# Patient Record
Sex: Male | Born: 1955 | Race: Black or African American | Hispanic: No | Smoking: Former smoker
Health system: Southern US, Community
[De-identification: ages and names within clinical notes are randomized; demographics above are authoritative.]

## PROBLEM LIST (undated history)

## (undated) DIAGNOSIS — K219 Gastro-esophageal reflux disease without esophagitis: Secondary | ICD-10-CM

## (undated) DIAGNOSIS — E119 Type 2 diabetes mellitus without complications: Secondary | ICD-10-CM

## (undated) DIAGNOSIS — C169 Malignant neoplasm of stomach, unspecified: Secondary | ICD-10-CM

## (undated) DIAGNOSIS — C801 Malignant (primary) neoplasm, unspecified: Secondary | ICD-10-CM

## (undated) HISTORY — PX: NO PAST SURGERIES: SHX2092

---

## 2004-02-19 ENCOUNTER — Emergency Department (HOSPITAL_COMMUNITY): Admission: EM | Admit: 2004-02-19 | Discharge: 2004-02-19 | Payer: Self-pay | Admitting: Emergency Medicine

## 2006-11-28 ENCOUNTER — Encounter (HOSPITAL_COMMUNITY): Admission: RE | Admit: 2006-11-28 | Discharge: 2006-12-28 | Payer: Self-pay | Admitting: Preventative Medicine

## 2014-12-06 HISTORY — PX: ESOPHAGOGASTRODUODENOSCOPY: SHX1529

## 2014-12-09 ENCOUNTER — Encounter (INDEPENDENT_AMBULATORY_CARE_PROVIDER_SITE_OTHER): Payer: Self-pay | Admitting: *Deleted

## 2015-01-05 ENCOUNTER — Encounter: Payer: Self-pay | Admitting: General Surgery

## 2015-01-05 NOTE — Progress Notes (Signed)
Troy Barrera 01/05/2015 11:25 AM Location: [facility] Patient #: [id number] DOB: [birth year] Single / Language: Troy Barrera / Race: Black or African American Male History of Present Illness Troy Hollingshead MD; 01/05/2015 12:57 PM) Patient words: GI mass.  The patient is a 59 year old male    Note:He is referred by Dr. 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 He presented complaining of dyspepsia and weight loss. Upper endoscopy December 10, 2014 demonstrated an ulcerative mass within the body of stomach on the greater curvature. Biopsy was consistent with adenocarcinoma. CT scan of chest, abdomen, and pelvis demonstrates some small lymph nodes around the tumor and a 5 mm pulmonary nodule. No other obvious metastatic disease. Has some nausea and vomiting with eating at times. Currently is taking Zantac. He is here with one of his family members and a friend. His evaluation has been done at [facility]. He is here seeking a second opinion on the surgery.  Past Surgical History Troy Barrera, [place]; 01/05/2015 11:25 AM) No pertinent past surgical history  Diagnostic Studies History Troy Barrera, CMA; 01/05/2015 11:25 AM) Colonoscopy 5-10 years ago  Allergies Troy Barrera, CMA; 01/05/2015 11:26 AM) No Known Drug Allergies 01/05/2015  Medication History (Troy Barrera, CMA; 01/05/2015 11:27 AM) GlipiZIDE ER (5MG  Tablet ER 24HR, Oral) Active. Zantac 75 (75MG  Tablet, Oral) Active. Medications Reconciled  Social History Troy Barrera, CMA; 01/05/2015 11:25 AM) Alcohol use Moderate alcohol use. Caffeine use Tea. Illicit drug use Remotely quit drug use. Tobacco use Current every day smoker.  Family History Troy Barrera, CMA; 01/05/2015 11:25 AM) Diabetes Mellitus Brother, Daughter, Mother, Sister. Heart Disease Father. Hypertension Mother.     Review of Systems ([place];  01/05/2015 11:25 AM) General Present- Weight Loss. Not Present- Appetite Loss, Chills, Fatigue, Fever, Night Sweats and Weight Gain. Skin Not Present- Change in Wart/Mole, Dryness, Hives, Jaundice, New Lesions, Non-Healing Wounds, Rash and Ulcer. HEENT Not Present- Earache, Hearing Loss, Hoarseness, Nose Bleed, Oral Ulcers, Ringing in the Ears, Seasonal Allergies, Sinus Pain, Sore Throat, Visual Disturbances, Wears glasses/contact lenses and Yellow Eyes. Respiratory Not Present- Bloody sputum, Chronic Cough, Difficulty Breathing, Snoring and Wheezing. Breast Not Present- Breast Mass, Breast Pain, Nipple Discharge and Skin Changes. Cardiovascular Not Present- Chest Pain, Difficulty Breathing Lying Down, Leg Cramps, Palpitations, Rapid Heart Rate, Shortness of Breath and Swelling of Extremities. Gastrointestinal Present- Abdominal Pain. Not Present- Bloating, Bloody Stool, Change in Bowel Habits, Chronic diarrhea, Constipation, Difficulty Swallowing, Excessive gas, Gets full quickly at meals, Hemorrhoids, Indigestion, Nausea, Rectal Pain and Vomiting. Male Genitourinary Not Present- Blood in Urine, Change in Urinary Stream, Frequency, Impotence, Nocturia, Painful Urination, Urgency and Urine Leakage.  Vitals (Troy Barrera CMA; 01/05/2015 11:26 AM) 01/05/2015 11:26 AM Weight: 154 lb Height: 68in Body Surface Area: 1.83 m Body Mass Index: 23.42 kg/m Temp.: 51F(Temporal)  Pulse: 76 (Regular)  BP: 130/80 (Sitting, Left Arm, Standard)     Physical Exam Troy Hollingshead MD; 01/05/2015 12:58 PM)  The physical exam findings are as follows: Note:General: WDWN in NAD. Pleasant and cooperative.  HEENT: Troy Barrera/AT, no facial masses  NECK: Supple, no obvious mass or thyroid enlargement.  CV: RRR, no murmur, no JVD.  CHEST: Breath sounds equal and clear. Respirations nonlabored.  ABDOMEN: Soft, nontender, nondistended, no masses, no organomegaly, active bowel sounds, no scars, no  hernias.  MUSCULOSKELETAL: FROM, good muscle tone, no edema, no venous stasis changes  LYMPHATIC: No palpable cervical, supraclavicular, axillary  adenopathy.  SKIN: No jaundice or suspicious rashes.  NEUROLOGIC: Alert and oriented, answers questions appropriately, normal gait and station.  PSYCHIATRIC: Normal mood, affect , and behavior.    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 We discussed the fact that surgery is the mainstay of treatment for gastric cancer. Depending on the stage, he may require further treatment.  Plan: I recommended partial gastrectomy/subtotal gastrectomy. We'll need to start nutritional supplements for diabetics 3 times a day and we discussed this. I recommended he stop smoking to decrease perioperative complications. We went over the procedure and risks of gastric resection. Risks include but are not limited to bleeding, infection, anesthesia, wound healing problems, injury to intra-abdominal organs, anastomotic leak, failure of stomach to empty, weight loss. I discussed with him the fact that some of these complications were increased with persistent smoking and strongly advised him to stop smoking. He seems to understand all this and would like to proceed.  Jackolyn Confer, MD

## 2015-01-17 ENCOUNTER — Ambulatory Visit (INDEPENDENT_AMBULATORY_CARE_PROVIDER_SITE_OTHER): Payer: Self-pay | Admitting: Internal Medicine

## 2015-01-24 NOTE — Patient Instructions (Addendum)
Troy Barrera  01/24/2015   Your procedure is scheduled on: Friday 01/28/2015  Report to [facility] Main  Entrance take [place]  elevators to 3rd floor to  [place] at  0730 AM.  Call this number if you have problems the morning of surgery [phone]   Remember: ONLY 1 PERSON MAY GO WITH YOU TO SHORT STAY TO GET  READY MORNING OF [place].  Do not eat food or drink liquids :After Midnight.     Take these medicines the morning of surgery with A SIP OF WATER: Zantac               DO NOT TAKE ANY DIABETIC MEDICATIONS MORNING OF SURGERY!                  You may not have any metal on your body including hair pins and              piercings  Do not wear jewelry, make-up, lotions, powders or perfumes, deodorant             Do not wear nail polish.  Do not shave  48 hours prior to surgery.              Men may shave face and neck.   Do not bring valuables to the hospital. [place].  Contacts, dentures or bridgework may not be worn into surgery.  Leave suitcase in the car. After surgery it may be brought to your room.     Patients discharged the day of surgery will not be allowed to drive home.  Name and phone number of your driver:  Special Instructions: N/A              Please read over the following fact sheets you were given: _____________________________________________________________________             [facility] - Preparing for Surgery Before surgery, you can play an important role.  Because skin is not sterile, your skin needs to be as free of germs as possible.  You can reduce the number of germs on your skin by washing with CHG (chlorahexidine gluconate) soap before surgery.  CHG is an antiseptic cleaner which kills germs and bonds with the skin to continue killing germs even after washing. Please DO NOT use if you have an allergy to CHG or antibacterial soaps.  If your skin becomes  reddened/irritated stop using the CHG and inform your nurse when you arrive at Short Stay. Do not shave (including legs and underarms) for at least 48 hours prior to the first CHG shower.  You may shave your face/neck. Please follow these instructions carefully:  1.  Shower with CHG Soap the night before surgery and the  morning of Surgery.  2.  If you choose to wash your hair, wash your hair first as usual with your  normal  shampoo.  3.  After you shampoo, rinse your hair and body thoroughly to remove the  shampoo.                           4.  Use CHG as you would any other liquid soap.  You can apply chg directly  to the skin and wash  Gently with a scrungie or clean washcloth.  5.  Apply the CHG Soap to your body ONLY FROM THE NECK DOWN.   Do not use on face/ open                           Wound or open sores. Avoid contact with eyes, ears mouth and genitals (private parts).                       Wash face,  Genitals (private parts) with your normal soap.             6.  Wash thoroughly, paying special attention to the area where your surgery  will be performed.  7.  Thoroughly rinse your body with warm water from the neck down.  8.  DO NOT shower/wash with your normal soap after using and rinsing off  the CHG Soap.                9.  Pat yourself dry with a clean towel.            10.  Wear clean pajamas.            11.  Place clean sheets on your bed the night of your first shower and do not  sleep with pets. Day of Surgery : Do not apply any lotions/deodorants the morning of surgery.  Please wear clean clothes to the hospital/surgery center.  FAILURE TO FOLLOW THESE INSTRUCTIONS MAY RESULT IN THE CANCELLATION OF YOUR SURGERY PATIENT SIGNATURE_________________________________  NURSE SIGNATURE__________________________________  ________________________________________________________________________   Troy Barrera  An incentive spirometer is a tool that  can help keep your lungs clear and active. This tool measures how well you are filling your lungs with each breath. Taking long deep breaths may help reverse or decrease the chance of developing breathing (pulmonary) problems (especially infection) following:  A long period of time when you are unable to move or be active. BEFORE THE PROCEDURE   If the spirometer includes an indicator to show your best effort, your nurse or respiratory therapist will set it to a desired goal.  If possible, sit up straight or lean slightly forward. Try not to slouch.  Hold the incentive spirometer in an upright position. INSTRUCTIONS FOR USE   Sit on the edge of your bed if possible, or sit up as far as you can in bed or on a chair.  Hold the incentive spirometer in an upright position.  Breathe out normally.  Place the mouthpiece in your mouth and seal your lips tightly around it.  Breathe in slowly and as deeply as possible, raising the piston or the ball toward the top of the column.  Hold your breath for 3-5 seconds or for as long as possible. Allow the piston or ball to fall to the bottom of the column.  Remove the mouthpiece from your mouth and breathe out normally.  Rest for a few seconds and repeat Steps 1 through 7 at least 10 times every 1-2 hours when you are awake. Take your time and take a few normal breaths between deep breaths.  The spirometer may include an indicator to show your best effort. Use the indicator as a goal to work toward during each repetition.  After each set of 10 deep breaths, practice coughing to be sure your lungs are clear. If you have an incision (the cut made at the time of surgery),  support your incision when coughing by placing a pillow or rolled up towels firmly against it. Once you are able to get out of bed, walk around indoors and cough well. You may stop using the incentive spirometer when instructed by your caregiver.  RISKS AND COMPLICATIONS  Take your  time so you do not get dizzy or light-headed.  If you are in pain, you may need to take or ask for pain medication before doing incentive spirometry. It is harder to take a deep breath if you are having pain. AFTER USE  Rest and breathe slowly and easily.  It can be helpful to keep track of a log of your progress. Your caregiver can provide you with a simple table to help with this. If you are using the spirometer at home, follow these instructions: [place] IF:   You are having difficultly using the spirometer.  You have trouble using the spirometer as often as instructed.  Your pain medication is not giving enough relief while using the spirometer.  You develop fever of 100.5 F (38.1 C) or higher. SEEK IMMEDIATE MEDICAL CARE IF:   You cough up bloody sputum that had not been present before.  You develop fever of 102 F (38.9 C) or greater.  You develop worsening pain at or near the incision site. MAKE SURE YOU:   Understand these instructions.  Will watch your condition.  Will get help right away if you are not doing well or get worse. Document Released: 09/03/2006 Document Revised: 07/16/2011 Document Reviewed: 11/04/2006 ExitCare Patient Information 2014 ExitCare, [place].   ________________________________________________________________________  WHAT IS A BLOOD TRANSFUSION? Blood Transfusion Information  A transfusion is the replacement of blood or some of its parts. Blood is made up of multiple cells which provide different functions.  Red blood cells carry oxygen and are used for blood loss replacement.  White blood cells fight against infection.  Platelets control bleeding.  Plasma helps clot blood.  Other blood products are available for specialized needs, such as hemophilia or other clotting disorders. BEFORE THE TRANSFUSION  Who gives blood for transfusions?   Healthy volunteers who are fully evaluated to make sure their blood is safe. This  is blood bank blood. Transfusion therapy is the safest it has ever been in the practice of medicine. Before blood is taken from a donor, a complete history is taken to make sure that person has no history of diseases nor engages in risky social behavior (examples are intravenous drug use or sexual activity with multiple partners). The donor's travel history is screened to minimize risk of transmitting infections, such as malaria. The donated blood is tested for signs of infectious diseases, such as HIV and hepatitis. The blood is then tested to be sure it is compatible with you in order to minimize the chance of a transfusion reaction. If you or a relative donates blood, this is often done in anticipation of surgery and is not appropriate for emergency situations. It takes many days to process the donated blood. RISKS AND COMPLICATIONS Although transfusion therapy is very safe and saves many lives, the main dangers of transfusion include:   Getting an infectious disease.  Developing a transfusion reaction. This is an allergic reaction to something in the blood you were given. Every precaution is taken to prevent this. The decision to have a blood transfusion has been considered carefully by your caregiver before blood is given. Blood is not given unless the benefits outweigh the risks. AFTER THE TRANSFUSION  Right after receiving a blood transfusion, you will usually feel much better and more energetic. This is especially true if your red blood cells have gotten low (anemic). The transfusion raises the level of the red blood cells which carry oxygen, and this usually causes an energy increase.  The nurse administering the transfusion will monitor you carefully for complications. HOME CARE INSTRUCTIONS  No special instructions are needed after a transfusion. You may find your energy is better. Speak with your caregiver about any limitations on activity for underlying diseases you may have. SEEK  MEDICAL CARE IF:   Your condition is not improving after your transfusion.  You develop redness or irritation at the intravenous (IV) site. SEEK IMMEDIATE MEDICAL CARE IF:  Any of the following symptoms occur over the next 12 hours:  Shaking chills.  You have a temperature by mouth above 102 F (38.9 C), not controlled by medicine.  Chest, back, or muscle pain.  People around you feel you are not acting correctly or are confused.  Shortness of breath or difficulty breathing.  Dizziness and fainting.  You get a rash or develop hives.  You have a decrease in urine output.  Your urine turns a dark color or changes to pink, red, or brown. Any of the following symptoms occur over the next 10 days:  You have a temperature by mouth above 102 F (38.9 C), not controlled by medicine.  Shortness of breath.  Weakness after normal activity.  The white part of the eye turns yellow (jaundice).  You have a decrease in the amount of urine or are urinating less often.  Your urine turns a dark color or changes to pink, red, or brown. Document Released: 04/20/2000 Document Revised: 07/16/2011 Document Reviewed: 12/08/2007 [facility] Patient Information 2014 [place], [place].  _______________________________________________________________________

## 2015-01-27 ENCOUNTER — Encounter (HOSPITAL_COMMUNITY)
Admission: RE | Admit: 2015-01-27 | Discharge: 2015-01-27 | Disposition: A | Discharge status: Home / Self Care | Payer: Managed Care, Other (non HMO) | Source: Ambulatory Visit | Attending: General Surgery | Admitting: General Surgery

## 2015-01-27 ENCOUNTER — Encounter (HOSPITAL_COMMUNITY): Payer: Self-pay

## 2015-01-27 HISTORY — DX: Gastro-esophageal reflux disease without esophagitis: K21.9

## 2015-01-27 HISTORY — DX: Type 2 diabetes mellitus without complications: E11.9

## 2015-01-27 HISTORY — DX: Malignant (primary) neoplasm, unspecified: C80.1

## 2015-01-27 LAB — CBC WITH DIFFERENTIAL/PLATELET
BASOS PCT: 1 %
Basophils Absolute: 0.1 10*3/uL (ref 0.0–0.1)
EOS ABS: 0.3 10*3/uL (ref 0.0–0.7)
EOS PCT: 5 %
HCT: 40.3 % (ref 39.0–52.0)
HEMOGLOBIN: 13.9 g/dL (ref 13.0–17.0)
Lymphocytes Relative: 45 %
Lymphs Abs: 2.7 10*3/uL (ref 0.7–4.0)
MCH: 27.7 pg (ref 26.0–34.0)
MCHC: 34.5 g/dL (ref 30.0–36.0)
MCV: 80.3 fL (ref 78.0–100.0)
Monocytes Absolute: 0.8 10*3/uL (ref 0.1–1.0)
Monocytes Relative: 13 %
NEUTROS PCT: 36 %
Neutro Abs: 2.2 10*3/uL (ref 1.7–7.7)
PLATELETS: 238 10*3/uL (ref 150–400)
RBC: 5.02 MIL/uL (ref 4.22–5.81)
RDW: 14.8 % (ref 11.5–15.5)
WBC: 6.1 10*3/uL (ref 4.0–10.5)

## 2015-01-27 LAB — COMPREHENSIVE METABOLIC PANEL
ALBUMIN: 4.1 g/dL (ref 3.5–5.0)
ALK PHOS: 68 U/L (ref 38–126)
ALT: 22 U/L (ref 17–63)
ANION GAP: 6 (ref 5–15)
AST: 22 U/L (ref 15–41)
BUN: 17 mg/dL (ref 6–20)
CALCIUM: 9 mg/dL (ref 8.9–10.3)
CHLORIDE: 106 mmol/L (ref 101–111)
CO2: 27 mmol/L (ref 22–32)
Creatinine, Ser: 1.19 mg/dL (ref 0.61–1.24)
GFR calc non Af Amer: >60 mL/min (ref >60)
GLUCOSE: 80 mg/dL (ref 65–99)
Potassium: 4.1 mmol/L (ref 3.5–5.1)
SODIUM: 139 mmol/L (ref 135–145)
Total Bilirubin: 0.2 mg/dL — ABNORMAL LOW (ref 0.3–1.2)
Total Protein: 7 g/dL (ref 6.5–8.1)

## 2015-01-27 LAB — ABO/RH: ABO/RH(D): B POS

## 2015-01-27 LAB — PROTIME-INR
INR: 1.08 (ref 0.00–1.49)
Prothrombin Time: 14.2 seconds (ref 11.6–15.2)

## 2015-01-28 ENCOUNTER — Encounter (HOSPITAL_COMMUNITY)
Admission: RE | Disposition: A | Discharge status: Home / Self Care | Payer: Self-pay | Source: Ambulatory Visit | Attending: General Surgery

## 2015-01-28 ENCOUNTER — Encounter (HOSPITAL_COMMUNITY): Payer: Self-pay

## 2015-01-28 ENCOUNTER — Inpatient Hospital Stay (HOSPITAL_COMMUNITY): Payer: Managed Care, Other (non HMO) | Admitting: Certified Registered Nurse Anesthetist

## 2015-01-28 ENCOUNTER — Inpatient Hospital Stay (HOSPITAL_COMMUNITY)
Admission: RE | Admit: 2015-01-28 | Discharge: 2015-02-03 | DRG: 328 | Disposition: A | Discharge status: Home / Self Care | Payer: Managed Care, Other (non HMO) | Source: Ambulatory Visit | Attending: General Surgery | Admitting: General Surgery

## 2015-01-28 DIAGNOSIS — K469 Unspecified abdominal hernia without obstruction or gangrene: Secondary | ICD-10-CM | POA: Diagnosis present

## 2015-01-28 DIAGNOSIS — C162 Malignant neoplasm of body of stomach: Principal | ICD-10-CM | POA: Diagnosis present

## 2015-01-28 DIAGNOSIS — C169 Malignant neoplasm of stomach, unspecified: Secondary | ICD-10-CM

## 2015-01-28 DIAGNOSIS — Z79899 Other long term (current) drug therapy: Secondary | ICD-10-CM

## 2015-01-28 DIAGNOSIS — E119 Type 2 diabetes mellitus without complications: Secondary | ICD-10-CM | POA: Diagnosis present

## 2015-01-28 DIAGNOSIS — K259 Gastric ulcer, unspecified as acute or chronic, without hemorrhage or perforation: Secondary | ICD-10-CM | POA: Diagnosis present

## 2015-01-28 HISTORY — PX: PARTIAL GASTRECTOMY: SHX6003

## 2015-01-28 HISTORY — DX: Malignant neoplasm of stomach, unspecified: C16.9

## 2015-01-28 LAB — TYPE AND SCREEN
ABO/RH(D): B POS
Antibody Screen: NEGATIVE

## 2015-01-28 LAB — GLUCOSE, CAPILLARY
GLUCOSE-CAPILLARY: 178 mg/dL — AB (ref 65–99)
Glucose-Capillary: 125 mg/dL — ABNORMAL HIGH (ref 65–99)
Glucose-Capillary: 166 mg/dL — ABNORMAL HIGH (ref 65–99)
Glucose-Capillary: 119 mg/dL — ABNORMAL HIGH (ref 65–99)

## 2015-01-28 SURGERY — GASTRECTOMY, PARTIAL
Anesthesia: General

## 2015-01-28 MED ORDER — ONDANSETRON HCL 4 MG/2ML IJ SOLN: 4.0000 mg | INTRAMUSCULAR | Status: DC | PRN

## 2015-01-28 MED ORDER — DIPHENHYDRAMINE HCL 12.5 MG/5ML PO ELIX
12.5000 mg | ORAL_SOLUTION | Freq: Four times a day (QID) | ORAL | Status: DC | PRN
  Filled 2015-01-28: qty 5

## 2015-01-28 MED ORDER — EPHEDRINE SULFATE 50 MG/ML IJ SOLN
INTRAMUSCULAR | Status: DC | PRN
  Administered 2015-01-28: 5 mg via INTRAVENOUS
  Administered 2015-01-28 (×2): 10 mg via INTRAVENOUS

## 2015-01-28 MED ORDER — MIDAZOLAM HCL 5 MG/5ML IJ SOLN
INTRAMUSCULAR | Status: DC | PRN
  Administered 2015-01-28: 2 mg via INTRAVENOUS

## 2015-01-28 MED ORDER — CEFAZOLIN SODIUM-DEXTROSE 2-3 GM-% IV SOLR
2.0000 g | Freq: Three times a day (TID) | INTRAVENOUS | Status: AC
  Administered 2015-01-28 (×2): 2 g via INTRAVENOUS
  Filled 2015-01-28 (×2): qty 50

## 2015-01-28 MED ORDER — CEFAZOLIN SODIUM-DEXTROSE 2-3 GM-% IV SOLR
2.0000 g | INTRAVENOUS | Status: AC
  Administered 2015-01-28: 2 g via INTRAVENOUS

## 2015-01-28 MED ORDER — DIPHENHYDRAMINE HCL 50 MG/ML IJ SOLN
12.5000 mg | Freq: Four times a day (QID) | INTRAMUSCULAR | Status: DC | PRN
  Administered 2015-01-30: 12.5 mg via INTRAVENOUS
  Filled 2015-01-28: qty 1

## 2015-01-28 MED ORDER — INFLUENZA VAC SPLIT QUAD 0.5 ML IM SUSY
0.5000 mL | PREFILLED_SYRINGE | INTRAMUSCULAR | Status: DC
  Filled 2015-01-28 (×2): qty 0.5

## 2015-01-28 MED ORDER — PANTOPRAZOLE SODIUM 40 MG IV SOLR
40.0000 mg | Freq: Two times a day (BID) | INTRAVENOUS | Status: DC
  Administered 2015-01-28 – 2015-02-02 (×11): 40 mg via INTRAVENOUS
  Filled 2015-01-28 (×14): qty 40

## 2015-01-28 MED ORDER — MORPHINE SULFATE 1 MG/ML IV SOLN
INTRAVENOUS | Status: DC
  Administered 2015-01-28: 13:00:00 via INTRAVENOUS

## 2015-01-28 MED ORDER — SODIUM CHLORIDE 0.9 % IJ SOLN: 9.0000 mL | INTRAMUSCULAR | Status: DC | PRN

## 2015-01-28 MED ORDER — LIDOCAINE HCL (CARDIAC) 20 MG/ML IV SOLN
INTRAVENOUS | Status: DC | PRN
  Administered 2015-01-28: 50 mg via INTRAVENOUS

## 2015-01-28 MED ORDER — SODIUM CHLORIDE 0.9 % IJ SOLN
INTRAMUSCULAR | Status: AC
  Filled 2015-01-28: qty 10

## 2015-01-28 MED ORDER — CEFAZOLIN SODIUM-DEXTROSE 2-3 GM-% IV SOLR
INTRAVENOUS | Status: AC
  Filled 2015-01-28: qty 50

## 2015-01-28 MED ORDER — GLYCOPYRROLATE 0.2 MG/ML IJ SOLN
INTRAMUSCULAR | Status: AC
  Filled 2015-01-28: qty 3

## 2015-01-28 MED ORDER — INSULIN ASPART 100 UNIT/ML ~~LOC~~ SOLN
0.0000 [IU] | SUBCUTANEOUS | Status: DC
  Administered 2015-01-28: 3 [IU] via SUBCUTANEOUS
  Administered 2015-01-29: 2 [IU] via SUBCUTANEOUS
  Administered 2015-01-29 – 2015-01-30 (×6): 3 [IU] via SUBCUTANEOUS
  Administered 2015-01-30: 2 [IU] via SUBCUTANEOUS
  Administered 2015-01-30 (×2): 3 [IU] via SUBCUTANEOUS
  Administered 2015-01-30 – 2015-02-01 (×8): 2 [IU] via SUBCUTANEOUS
  Administered 2015-02-01: 3 [IU] via SUBCUTANEOUS
  Administered 2015-02-01 – 2015-02-02 (×3): 2 [IU] via SUBCUTANEOUS

## 2015-01-28 MED ORDER — HYDROMORPHONE HCL 1 MG/ML IJ SOLN
INTRAMUSCULAR | Status: AC
  Filled 2015-01-28: qty 1

## 2015-01-28 MED ORDER — NEOSTIGMINE METHYLSULFATE 10 MG/10ML IV SOLN
INTRAVENOUS | Status: AC
  Filled 2015-01-28: qty 1

## 2015-01-28 MED ORDER — FENTANYL CITRATE (PF) 100 MCG/2ML IJ SOLN
INTRAMUSCULAR | Status: DC | PRN
  Administered 2015-01-28 (×3): 50 ug via INTRAVENOUS
  Administered 2015-01-28: 100 ug via INTRAVENOUS

## 2015-01-28 MED ORDER — HEPARIN SODIUM (PORCINE) 5000 UNIT/ML IJ SOLN
5000.0000 [IU] | Freq: Three times a day (TID) | INTRAMUSCULAR | Status: DC
  Administered 2015-01-29 – 2015-02-03 (×15): 5000 [IU] via SUBCUTANEOUS
  Filled 2015-01-28 (×18): qty 1

## 2015-01-28 MED ORDER — ONDANSETRON HCL 4 MG/2ML IJ SOLN: 4.0000 mg | Freq: Four times a day (QID) | INTRAMUSCULAR | Status: DC | PRN

## 2015-01-28 MED ORDER — MORPHINE SULFATE 1 MG/ML IV SOLN
INTRAVENOUS | Status: DC
  Administered 2015-01-28: 7.5 mg via INTRAVENOUS
  Administered 2015-01-28: 18:00:00 via INTRAVENOUS
  Administered 2015-01-28: 39.9 mg via INTRAVENOUS
  Administered 2015-01-29: 1 mg via INTRAVENOUS
  Administered 2015-01-29: 37.5 mg via INTRAVENOUS
  Administered 2015-01-29: 26.46 mg via INTRAVENOUS
  Administered 2015-01-29: 27.96 mg via INTRAVENOUS
  Administered 2015-01-29 (×2): via INTRAVENOUS
  Administered 2015-01-29: 15 mg via INTRAVENOUS
  Administered 2015-01-30 (×2): via INTRAVENOUS
  Administered 2015-01-30: 12 mg via INTRAVENOUS
  Administered 2015-01-30: 21 mg via INTRAVENOUS
  Administered 2015-01-30: 35 mg via INTRAVENOUS
  Administered 2015-01-31: 24 mg via INTRAVENOUS
  Administered 2015-01-31: 22.1 mg via INTRAVENOUS
  Administered 2015-01-31: 12 mg via INTRAVENOUS
  Administered 2015-01-31: 15 mg via INTRAVENOUS
  Administered 2015-01-31: 19.5 mg via INTRAVENOUS
  Administered 2015-01-31: 3 mg via INTRAVENOUS
  Administered 2015-01-31: 15:00:00 via INTRAVENOUS
  Administered 2015-01-31: 8.6 mg via INTRAVENOUS
  Administered 2015-01-31: 06:00:00 via INTRAVENOUS
  Administered 2015-01-31: 1 mg via INTRAVENOUS
  Administered 2015-02-01: 10.5 mg via INTRAVENOUS
  Filled 2015-01-28 (×16): qty 25

## 2015-01-28 MED ORDER — SUCCINYLCHOLINE CHLORIDE 20 MG/ML IJ SOLN
INTRAMUSCULAR | Status: DC | PRN
  Administered 2015-01-28: 100 mg via INTRAVENOUS

## 2015-01-28 MED ORDER — FENTANYL CITRATE (PF) 250 MCG/5ML IJ SOLN
INTRAMUSCULAR | Status: AC
  Filled 2015-01-28: qty 25

## 2015-01-28 MED ORDER — MORPHINE SULFATE 1 MG/ML IV SOLN
INTRAVENOUS | Status: AC
  Filled 2015-01-28: qty 25

## 2015-01-28 MED ORDER — PROPOFOL 10 MG/ML IV BOLUS
INTRAVENOUS | Status: AC
  Filled 2015-01-28: qty 20

## 2015-01-28 MED ORDER — PROPOFOL 10 MG/ML IV BOLUS
INTRAVENOUS | Status: DC | PRN
  Administered 2015-01-28: 150 mg via INTRAVENOUS

## 2015-01-28 MED ORDER — 0.9 % SODIUM CHLORIDE (POUR BTL) OPTIME
TOPICAL | Status: DC | PRN
  Administered 2015-01-28: 3000 mL

## 2015-01-28 MED ORDER — ONDANSETRON HCL 4 MG/2ML IJ SOLN
4.0000 mg | Freq: Four times a day (QID) | INTRAMUSCULAR | Status: DC | PRN
Start: 2015-01-28 — End: 2015-02-01

## 2015-01-28 MED ORDER — ONDANSETRON HCL 4 MG/2ML IJ SOLN
INTRAMUSCULAR | Status: DC | PRN
  Administered 2015-01-28: 4 mg via INTRAVENOUS

## 2015-01-28 MED ORDER — DIPHENHYDRAMINE HCL 12.5 MG/5ML PO ELIX: 12.5000 mg | ORAL_SOLUTION | Freq: Four times a day (QID) | ORAL | Status: DC | PRN

## 2015-01-28 MED ORDER — ONDANSETRON 4 MG PO TBDP: 4.0000 mg | ORAL_TABLET | Freq: Four times a day (QID) | ORAL | Status: DC | PRN

## 2015-01-28 MED ORDER — ROCURONIUM BROMIDE 100 MG/10ML IV SOLN
INTRAVENOUS | Status: DC | PRN
  Administered 2015-01-28: 5 mg via INTRAVENOUS
  Administered 2015-01-28: 20 mg via INTRAVENOUS
  Administered 2015-01-28: 30 mg via INTRAVENOUS

## 2015-01-28 MED ORDER — ONDANSETRON HCL 4 MG/2ML IJ SOLN
INTRAMUSCULAR | Status: AC
  Filled 2015-01-28: qty 2

## 2015-01-28 MED ORDER — EPHEDRINE SULFATE 50 MG/ML IJ SOLN
INTRAMUSCULAR | Status: AC
  Filled 2015-01-28: qty 1

## 2015-01-28 MED ORDER — HYDROMORPHONE HCL 1 MG/ML IJ SOLN
INTRAMUSCULAR | Status: AC
  Administered 2015-01-28: 1 mg
  Filled 2015-01-28: qty 1

## 2015-01-28 MED ORDER — GLYCOPYRROLATE 0.2 MG/ML IJ SOLN
INTRAMUSCULAR | Status: DC | PRN
Start: 2015-01-28 — End: 2015-01-28
  Administered 2015-01-28: 0.6 mg via INTRAVENOUS

## 2015-01-28 MED ORDER — KCL IN DEXTROSE-NACL 20-5-0.45 MEQ/L-%-% IV SOLN
INTRAVENOUS | Status: DC
  Administered 2015-01-28 – 2015-01-31 (×5): via INTRAVENOUS
  Filled 2015-01-28 (×11): qty 1000

## 2015-01-28 MED ORDER — LORAZEPAM 2 MG/ML IJ SOLN
1.0000 mg | Freq: Once | INTRAMUSCULAR | Status: AC
  Administered 2015-01-28: 1 mg via INTRAVENOUS

## 2015-01-28 MED ORDER — LIDOCAINE HCL (CARDIAC) 20 MG/ML IV SOLN
INTRAVENOUS | Status: AC
  Filled 2015-01-28: qty 5

## 2015-01-28 MED ORDER — NEOSTIGMINE METHYLSULFATE 10 MG/10ML IV SOLN
INTRAVENOUS | Status: DC | PRN
  Administered 2015-01-28: 4 mg via INTRAVENOUS

## 2015-01-28 MED ORDER — ROCURONIUM BROMIDE 100 MG/10ML IV SOLN
INTRAVENOUS | Status: AC
  Filled 2015-01-28: qty 1

## 2015-01-28 MED ORDER — HYDROMORPHONE HCL 1 MG/ML IJ SOLN
0.2500 mg | INTRAMUSCULAR | Status: DC | PRN
  Administered 2015-01-28 (×6): 0.5 mg via INTRAVENOUS

## 2015-01-28 MED ORDER — LACTATED RINGERS IV SOLN
INTRAVENOUS | Status: DC
  Administered 2015-01-28: 10:00:00 via INTRAVENOUS
  Administered 2015-01-28: 1000 mL via INTRAVENOUS

## 2015-01-28 MED ORDER — DIPHENHYDRAMINE HCL 50 MG/ML IJ SOLN: 12.5000 mg | Freq: Four times a day (QID) | INTRAMUSCULAR | Status: DC | PRN

## 2015-01-28 MED ORDER — MIDAZOLAM HCL 2 MG/2ML IJ SOLN
INTRAMUSCULAR | Status: AC
  Filled 2015-01-28: qty 4

## 2015-01-28 MED ORDER — NALOXONE HCL 0.4 MG/ML IJ SOLN: 0.4000 mg | INTRAMUSCULAR | Status: DC | PRN

## 2015-01-28 MED ORDER — METHOCARBAMOL 1000 MG/10ML IJ SOLN
500.0000 mg | Freq: Four times a day (QID) | INTRAVENOUS | Status: DC | PRN
  Administered 2015-01-28: 500 mg via INTRAVENOUS
  Filled 2015-01-28 (×2): qty 5

## 2015-01-28 MED ORDER — NALOXONE HCL 0.4 MG/ML IJ SOLN
0.4000 mg | INTRAMUSCULAR | Status: DC | PRN
Start: 2015-01-28 — End: 2015-01-28

## 2015-01-28 SURGICAL SUPPLY — 51 items
BAG URINE DRAINAGE (UROLOGICAL SUPPLIES) ×2 IMPLANT
BLADE EXTENDED COATED 6.5IN (ELECTRODE) ×3 IMPLANT
BLADE HEX COATED 2.75 (ELECTRODE) ×3 IMPLANT
CATH FOLEY 2WAY SLVR  5CC 18FR (CATHETERS)
CATH FOLEY 2WAY SLVR  5CC 22FR (CATHETERS)
CATH FOLEY 2WAY SLVR 30CC 20FR (CATHETERS) IMPLANT
CATH FOLEY 2WAY SLVR 30CC 22FR (CATHETERS) IMPLANT
CATH FOLEY 2WAY SLVR 5CC 18FR (CATHETERS) IMPLANT
CATH FOLEY 2WAY SLVR 5CC 22FR (CATHETERS) ×1 IMPLANT
CLIP TI LARGE 6 (CLIP) ×1 IMPLANT
COVER MAYO STAND STRL (DRAPES) ×3 IMPLANT
COVER SURGICAL LIGHT HANDLE (MISCELLANEOUS) ×3 IMPLANT
DRAPE INCISE IOBAN 66X45 STRL (DRAPES) ×1 IMPLANT
DRAPE LAPAROSCOPIC ABDOMINAL (DRAPES) ×3 IMPLANT
DRAPE UTILITY XL STRL (DRAPES) ×1 IMPLANT
DRSG OPSITE POSTOP 4X10 (GAUZE/BANDAGES/DRESSINGS) ×2 IMPLANT
ELECT COATED BLADE 2.86 ST (ELECTRODE) IMPLANT
GLOVE BIOGEL PI IND STRL 7.0 (GLOVE) ×1 IMPLANT
GLOVE BIOGEL PI INDICATOR 7.0 (GLOVE) ×2
GLOVE ECLIPSE 8.0 STRL XLNG CF (GLOVE) ×3 IMPLANT
GLOVE INDICATOR 8.0 STRL GRN (GLOVE) ×6 IMPLANT
GOWN STRL REUS W/TWL LRG LVL3 (GOWN DISPOSABLE) ×3 IMPLANT
GOWN STRL REUS W/TWL XL LVL3 (GOWN DISPOSABLE) ×6 IMPLANT
KIT BASIN OR (CUSTOM PROCEDURE TRAY) ×3 IMPLANT
PACK GENERAL/GYN (CUSTOM PROCEDURE TRAY) ×3 IMPLANT
RELOAD PROXIMATE 75MM BLUE (ENDOMECHANICALS) ×3 IMPLANT
RELOAD PROXIMATE 75MM GREEN (ENDOMECHANICALS) ×6 IMPLANT
RELOAD STAPLE 75 3.8 BLU REG (ENDOMECHANICALS) IMPLANT
RELOAD STAPLE 75 4.5 GRN THCK (ENDOMECHANICALS) IMPLANT
STAPLER PROXIMATE 75MM BLUE (STAPLE) ×2 IMPLANT
STAPLER VISISTAT 35W (STAPLE) ×3 IMPLANT
SUCTION POOLE TIP (SUCTIONS) ×3 IMPLANT
SUT NOV 1 T60/GS (SUTURE) IMPLANT
SUT NOVA T20/GS 25 (SUTURE) IMPLANT
SUT PDS AB 1 CTX 36 (SUTURE) IMPLANT
SUT PDS AB 1 TP1 96 (SUTURE) ×4 IMPLANT
SUT SILK 2 0 SH CR/8 (SUTURE) ×2 IMPLANT
SUT SILK 3 0 (SUTURE) ×3
SUT SILK 3 0 SH CR/8 (SUTURE) ×2 IMPLANT
SUT SILK 3-0 18XBRD TIE 12 (SUTURE) IMPLANT
SUT VIC AB 2-0 SH 18 (SUTURE) ×2 IMPLANT
SUT VIC AB 3-0 SH 18 (SUTURE) ×4 IMPLANT
SUT VIC AB 3-0 SH 27 (SUTURE) ×3
SUT VIC AB 3-0 SH 27XBRD (SUTURE) IMPLANT
SUT VICRYL 2 0 18  UND BR (SUTURE) ×2
SUT VICRYL 2 0 18 UND BR (SUTURE) ×1 IMPLANT
SUT VICRYL 3 0 BR 18  UND (SUTURE) ×2
SUT VICRYL 3 0 BR 18 UND (SUTURE) ×1 IMPLANT
TOWEL OR 17X26 10 PK STRL BLUE (TOWEL DISPOSABLE) ×6 IMPLANT
TRAY FOLEY W/METER SILVER 14FR (SET/KITS/TRAYS/PACK) ×1 IMPLANT
TRAY FOLEY W/METER SILVER 16FR (SET/KITS/TRAYS/PACK) ×3 IMPLANT

## 2015-01-28 NOTE — Anesthesia Preprocedure Evaluation (Signed)
Anesthesia Evaluation  Patient identified by MRN, date of birth, ID band Patient awake    Reviewed: Allergy & Precautions, H&P , Patient's Chart, lab work & pertinent test results, reviewed documented beta blocker date and time   Airway Mallampati: II  TM Distance: >3 FB Neck ROM: full    Dental no notable dental hx.    Pulmonary former smoker,    Pulmonary exam normal breath sounds clear to auscultation       Cardiovascular  Rhythm:regular Rate:Normal     Neuro/Psych    GI/Hepatic GERD  Medicated,  Endo/Other  diabetes, Type 2  Renal/GU      Musculoskeletal   Abdominal   Peds  Hematology   Anesthesia Other Findings T2DM Hx of Smoker Blood Bank Ab screen neg  Reproductive/Obstetrics                             Anesthesia Physical Anesthesia Plan  ASA: II  Anesthesia Plan: General   Post-op Pain Management:    Induction: Intravenous  Airway Management Planned: Oral ETT  Additional Equipment:   Intra-op Plan:   Post-operative Plan: Extubation in OR  Informed Consent: I have reviewed the patients History and Physical, chart, labs and discussed the procedure including the risks, benefits and alternatives for the proposed anesthesia with the patient or authorized representative who has indicated his/her understanding and acceptance.   Dental Advisory Given and Dental advisory given  Plan Discussed with: CRNA and Surgeon  Anesthesia Plan Comments: (  Discussed general anesthesia, including possible nausea, instrumentation of airway, sore throat,pulmonary aspiration, etc. I asked if the were any outstanding questions, or  concerns before we proceeded. )        Anesthesia Quick Evaluation

## 2015-01-28 NOTE — Progress Notes (Signed)
Telephone order received from MD [name] to reenter order for Morphine Full Dose PCA, order entered Neta Mends RN 01-28-2015 3:43 PM

## 2015-01-28 NOTE — Op Note (Signed)
Operative Note  Troy Barrera male 59 y.o. 01/28/2015  PREOPERATIVE DX:  Gastric cancer  POSTOPERATIVE DX:  Same  PROCEDURE:   Subtotal gastrectomy         Surgeon: [name]   Assistants: Armandina Gemma, MD  Anesthesia: General endotracheal anesthesia  Indications:   This is a 59 year old male who complained of dyspepsia and weight loss. Upper endoscopy demonstrated ulcerative mass within the body of the stomach on the greater curvature. Biopsy was consistent with adenocarcinoma. CT scan did not demonstrate obvious metastatic disease. He now presents for the above procedure.    Procedure Detail:  He was brought to the operating room placed supine on the operating table and general anesthetic was given. A Foley catheter was inserted. A nasogastric tube was inserted. The abdominal wall hair was clipped and the mesentery was widely sterilely prepped and draped.  An upper midline incision was made beginning at the xiphoid process and carrying down around the umbilicus incising the skin sharply. Subcutaneous tissue fascia and peritoneum were divided with electrocautery. Once entering the abdominal cavity was explored. There were no liver lesions. There are no peritoneal nodules. There are no pelvic nodules.  The tumor was palpable in the body of the stomach greater curvature. The lesser sac was entered. I saw omentum was divided down just distal to the pylorus with the LigaSure. There is divided approximately 6-7 cm above the tumor with the LigaSure. Gastrohepatic omentum was divided near the pylorus. Using the linear cutting stapler, I divided the first portion of duodenum just distal to the pylorus.  Using the linear cutting stapler, then divided the proximal portion the stomach 6-7 cm proximal to the lesion. A proximally 75% of the stomach was removed. Bleeding from the proximal staple line was oversewn with 2-0 silk sutures. The specimen was sent to pathology. Frozen section of the  proximal margins were negative for tumor.  Ligament of Treitz was identified. A 0.40 cm from the ligament of Treitz. A small defect was made in the transverse mesocolon. The jejunum was then passed through this defect. A side-to-side jejunal to gastric anastomosis was then performed with the linear cutting stapler. Staple line was hemostatic.  The common defect was closed in 2 layers. The first layer was running locking 3-0 Vicryl. The second layer was 2-0 silk in a Lembert type fashion. A crotch stitch of 2-0 silk was placed. The mesentery was then anchored to the small bowel illuminating source of internal hernia with interrupted 3-0 silk sutures. Anastomosis was widely patent and under don't tension. It was viable. The nasogastric tube was in position proximal to the anastomosis.  The abdominal cavity was irrigated; there was no bleeding noted. The fascia of the midline incision was then closed with running double looped #1 PDS suture. Subcutaneous tissue was irrigated. The skin was closed with staples. Sterile dressing was applied.  He tolerated the procedure well without any apparent complications and was taken to the recovery room in satisfactory condition.  Estimated Blood Loss:  200 mL                Specimens: majority of stomach.        Complications:  * No complications entered in OR log *         Disposition: PACU - hemodynamically stable.         Condition: stable

## 2015-01-28 NOTE — H&P (View-Only) (Signed)
Troy Barrera 01/05/2015 11:25 AM Location: [facility] Patient #: [id number] DOB: 30-Nov-1955 Single / Language: Troy Barrera / Race: Black or African American Male History of Present Illness Troy Hollingshead MD; 01/05/2015 12:57 PM) Patient words: GI mass.  The patient is a 59 year old male    Note:He is referred by Dr. 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 He presented complaining of dyspepsia and weight loss. Upper endoscopy December 10, 2014 demonstrated an ulcerative mass within the body of stomach on the greater curvature. Biopsy was consistent with adenocarcinoma. CT scan of chest, abdomen, and pelvis demonstrates some small lymph nodes around the tumor and a 5 mm pulmonary nodule. No other obvious metastatic disease. Has some nausea and vomiting with eating at times. Currently is taking Zantac. He is here with one of his family members and a friend. His evaluation has been done at [facility]. He is here seeking a second opinion on the surgery.  Past Surgical History Troy Barrera, Troy [facility]rera; 01/05/2015 11:25 AM) No pertinent past surgical history  Diagnostic Studies History Troy Barrera, CMA; 01/05/2015 11:25 AM) Colonoscopy 5-10 years ago  Allergies Troy Barrera, CMA; 01/05/2015 11:26 AM) No Known Drug Allergies 01/05/2015  Medication History (Troy Barrera, CMA; 01/05/2015 11:27 AM) GlipiZIDE ER (5MG  Tablet ER 24HR, Oral) Active. Zantac 75 (75MG  Tablet, Oral) Active. Medications Reconciled  Social History Troy Barrera, CMA; 01/05/2015 11:25 AM) Alcohol use Moderate alcohol use. Caffeine use Tea. Illicit drug use Remotely quit drug use. Tobacco use Current every day smoker.  Family History Troy Barrera, CMA; 01/05/2015 11:25 AM) Diabetes Mellitus Brother, Daughter, Mother, Sister. Heart Disease Father. Hypertension Mother.     Review of Systems (Troy Barrera;  01/05/2015 11:25 AM) General Present- Weight Loss. Not Present- Appetite Loss, Chills, Fatigue, Fever, Night Sweats and Weight Gain. Skin Not Present- Change in Wart/Mole, Dryness, Hives, Jaundice, New Lesions, Non-Healing Wounds, Rash and Ulcer. HEENT Not Present- Earache, Hearing Loss, Hoarseness, Nose Bleed, Oral Ulcers, Ringing in the Ears, Seasonal Allergies, Sinus Pain, Sore Throat, Visual Disturbances, Wears glasses/contact lenses and Yellow Eyes. Respiratory Not Present- Bloody sputum, Chronic Cough, Difficulty Breathing, Snoring and Wheezing. Breast Not Present- Breast Mass, Breast Pain, Nipple Discharge and Skin Changes. Cardiovascular Not Present- Chest Pain, Difficulty Breathing Lying Down, Leg Cramps, Palpitations, Rapid Heart Rate, Shortness of Breath and Swelling of Extremities. Gastrointestinal Present- Abdominal Pain. Not Present- Bloating, Bloody Stool, Change in Bowel Habits, Chronic diarrhea, Constipation, Difficulty Swallowing, Excessive gas, Gets full quickly at meals, Hemorrhoids, Indigestion, Nausea, Rectal Pain and Vomiting. Male Genitourinary Not Present- Blood in Urine, Change in Urinary Stream, Frequency, Impotence, Nocturia, Painful Urination, Urgency and Urine Leakage.  Vitals (Troy Barrera CMA; 01/05/2015 11:26 AM) 01/05/2015 11:26 AM Weight: 154 lb Height: 68in Body Surface Area: 1.83 m Body Mass Index: 23.42 kg/m Temp.: 65F(Temporal)  Pulse: 76 (Regular)  BP: 130/80 (Sitting, Left Arm, Standard)     Physical Exam Troy Hollingshead MD; 01/05/2015 12:58 PM)  The physical exam findings are as follows: Note:General: WDWN in NAD. Pleasant and cooperative.  HEENT: [place]/AT, no facial masses  NECK: Supple, no obvious mass or thyroid enlargement.  CV: RRR, no murmur, no JVD.  CHEST: Breath sounds equal and clear. Respirations nonlabored.  ABDOMEN: Soft, nontender, nondistended, no masses, no organomegaly, active bowel sounds, no scars, no  hernias.  MUSCULOSKELETAL: FROM, good muscle tone, no edema, no venous stasis changes  LYMPHATIC: No palpable cervical, supraclavicular, axillary  adenopathy.  SKIN: No jaundice or suspicious rashes.  NEUROLOGIC: Alert and oriented, answers questions appropriately, normal gait and station.  PSYCHIATRIC: Normal mood, affect , and behavior.    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 We discussed the fact that surgery is the mainstay of treatment for gastric cancer. Depending on the stage, he may require further treatment.  Plan: I recommended partial gastrectomy/subtotal gastrectomy. We'll need to start nutritional supplements for diabetics 3 times a day and we discussed this. I recommended he stop smoking to decrease perioperative complications. We went over the procedure and risks of gastric resection. Risks include but are not limited to bleeding, infection, anesthesia, wound healing problems, injury to intra-abdominal organs, anastomotic leak, failure of stomach to empty, weight loss. I discussed with him the fact that some of these complications were increased with persistent smoking and strongly advised him to stop smoking. He seems to understand all this and would like to proceed.  Troy Confer, MD

## 2015-01-28 NOTE — Transfer of Care (Signed)
Immediate Anesthesia Transfer of Care Note  Patient: Troy Barrera  Procedure(s) Performed: Procedure(s): PARTIAL GASTRECTOMY (N/A)  Patient Location: PACU  Anesthesia Type:General  Level of Consciousness: awake, alert  and oriented  Airway & Oxygen Therapy: Patient Spontanous Breathing and Patient connected to face mask oxygen  Post-op Assessment: Report given to RN and Post -op Vital signs reviewed and stable  Post vital signs: Reviewed and stable  Last Vitals:  Filed Vitals:   01/28/15 0724  BP: 161/93  Pulse: 57  Temp: 36.4 C  Resp: 18    Complications: No apparent anesthesia complications

## 2015-01-28 NOTE — Interval H&P Note (Signed)
History and Physical Interval Note:  01/28/2015 8:44 AM  Troy Barrera  has presented today for surgery, with the diagnosis of Gastric cancer  The various methods of treatment have been discussed with the patient and family. After consideration of risks, benefits and other options for treatment, the patient has consented to  Procedure(s): PARTIAL GASTRECTOMY (N/A) as a surgical intervention .  The patient's history has been reviewed, patient examined, no change in status, stable for surgery.  I have reviewed the patient's chart and labs.  Questions were answered to the patient's satisfaction.     [name],[name]

## 2015-01-28 NOTE — Anesthesia Postprocedure Evaluation (Signed)
  Anesthesia Post-op Note  Patient: Troy Barrera  Procedure(s) Performed: Procedure(s): PARTIAL GASTRECTOMY (N/A)  Patient Location: PACU  Anesthesia Type:General  Level of Consciousness: awake, alert  and oriented  Airway and Oxygen Therapy: Patient Spontanous Breathing  Post-op Pain: moderate  Post-op Assessment: Post-op Vital signs reviewed and Patient's Cardiovascular Status Stable              Post-op Vital Signs: Reviewed and stable  Last Vitals:  Filed Vitals:   01/28/15 1543  BP:   Pulse:   Temp:   Resp: 16    Complications: No apparent anesthesia complications

## 2015-01-28 NOTE — Anesthesia Procedure Notes (Signed)
Procedure Name: Intubation Date/Time: 01/28/2015 9:21 AM Performed by: Dimas Millin, Treydon Henricks F Pre-anesthesia Checklist: Patient identified, Emergency Drugs available, Suction available, Patient being monitored and Timeout performed Patient Re-evaluated:Patient Re-evaluated prior to inductionOxygen Delivery Method: Circle system utilized Preoxygenation: Pre-oxygenation with 100% oxygen Intubation Type: IV induction Ventilation: Mask ventilation without difficulty Laryngoscope Size: Miller and 2 Grade View: Grade I Tube size: 7.5 mm Number of attempts: 1 Airway Equipment and Method: Stylet Placement Confirmation: ETT inserted through vocal cords under direct vision,  positive ETCO2 and breath sounds checked- equal and bilateral Secured at: 22 cm Tube secured with: Tape Dental Injury: Teeth and Oropharynx as per pre-operative assessment

## 2015-01-28 NOTE — Progress Notes (Addendum)
Continue giving Dilaudid in pacu - per Dr Glennon Mac

## 2015-01-28 NOTE — Progress Notes (Signed)
Ativan 1 mg wasted in sharps witnessed per Bridget Hartshorn RN

## 2015-01-29 LAB — CBC
HEMATOCRIT: 38.3 % — AB (ref 39.0–52.0)
HEMOGLOBIN: 12.8 g/dL — AB (ref 13.0–17.0)
MCH: 27.5 pg (ref 26.0–34.0)
MCHC: 33.4 g/dL (ref 30.0–36.0)
MCV: 82.2 fL (ref 78.0–100.0)
Platelets: 245 10*3/uL (ref 150–400)
RBC: 4.66 MIL/uL (ref 4.22–5.81)
RDW: 15.1 % (ref 11.5–15.5)
WBC: 11.7 10*3/uL — ABNORMAL HIGH (ref 4.0–10.5)

## 2015-01-29 LAB — BASIC METABOLIC PANEL
Anion gap: 9 (ref 5–15)
BUN: 12 mg/dL (ref 6–20)
CO2: 27 mmol/L (ref 22–32)
Calcium: 8.4 mg/dL — ABNORMAL LOW (ref 8.9–10.3)
Chloride: 100 mmol/L — ABNORMAL LOW (ref 101–111)
Creatinine, Ser: 1.2 mg/dL (ref 0.61–1.24)
GFR calc Af Amer: >60 mL/min (ref >60)
GFR calc non Af Amer: >60 mL/min (ref >60)
Glucose, Bld: 141 mg/dL — ABNORMAL HIGH (ref 65–99)
Potassium: 3.9 mmol/L (ref 3.5–5.1)
Sodium: 136 mmol/L (ref 135–145)

## 2015-01-29 LAB — GLUCOSE, CAPILLARY
GLUCOSE-CAPILLARY: 130 mg/dL — AB (ref 65–99)
Glucose-Capillary: 151 mg/dL — ABNORMAL HIGH (ref 65–99)
Glucose-Capillary: 155 mg/dL — ABNORMAL HIGH (ref 65–99)
Glucose-Capillary: 157 mg/dL — ABNORMAL HIGH (ref 65–99)
Glucose-Capillary: 180 mg/dL — ABNORMAL HIGH (ref 65–99)
Glucose-Capillary: 186 mg/dL — ABNORMAL HIGH (ref 65–99)

## 2015-01-29 MED ORDER — ALBUTEROL SULFATE (2.5 MG/3ML) 0.083% IN NEBU: 2.5000 mg | INHALATION_SOLUTION | RESPIRATORY_TRACT | Status: DC | PRN

## 2015-01-29 MED ORDER — CHLORHEXIDINE GLUCONATE 0.12 % MT SOLN
15.0000 mL | Freq: Four times a day (QID) | OROMUCOSAL | Status: DC
  Administered 2015-01-29 – 2015-02-01 (×11): 15 mL via OROMUCOSAL
  Filled 2015-01-29 (×19): qty 15

## 2015-01-29 NOTE — Progress Notes (Signed)
Patient ID: Troy Barrera, male   DOB: 09-13-1955, 59 y.o.   MRN: [id number]  [facility]  POD#: 1  Subjective: Patient in bed, family at bedside.  Moderate pain - using PCA.  Has not been out of bed.  Objective: Vital signs in last 24 hours: Temp:  [97.9 F (36.6 C)-100.3 F (37.9 C)] 100.3 F (37.9 C) (09/24 6222) Pulse Rate:  [65-92] 89 (09/24 0607) Resp:  [12-20] 20 (09/24 0607) BP: (106-144)/(60-94) 125/71 mmHg (09/24 0607) SpO2:  [94 %-100 %] 96 % (09/24 0607) FiO2 (%):  [38 %-47 %] 38 % (09/24 0532)    Intake/Output from previous day: 09/23 0701 - 09/24 0700 In: 2638.3 [I.V.:2638.3] Out: 1650 [Urine:1650] Intake/Output this shift:    Physical Exam: HEENT - sclerae clear, mucous membranes moist Neck - soft Chest - coarse bilaterally, some rhonchi Cor - RRR Abdomen - soft, dressing dry and intact; small bloody in NG output Ext - no edema, non-tender Neuro - alert & oriented, no focal deficits  Lab Results:   Recent Labs  01/27/15 1510 01/29/15 0545  WBC 6.1 11.7*  HGB 13.9 12.8*  HCT 40.3 38.3*  PLT 238 245   BMET  Recent Labs  01/27/15 1510 01/29/15 0545  NA 139 136  K 4.1 3.9  CL 106 100*  CO2 27 27  GLUCOSE 80 141*  BUN 17 12  CREATININE 1.19 1.20  CALCIUM 9.0 8.4*   PT/INR  Recent Labs  01/27/15 1510  LABPROT 14.2  INR 1.08   Comprehensive Metabolic Panel:    Component Value Date/Time   NA 136 01/29/2015 0545   NA 139 01/27/2015 1510   K 3.9 01/29/2015 0545   K 4.1 01/27/2015 1510   CL 100* 01/29/2015 0545   CL 106 01/27/2015 1510   CO2 27 01/29/2015 0545   CO2 27 01/27/2015 1510   BUN 12 01/29/2015 0545   BUN 17 01/27/2015 1510   CREATININE 1.20 01/29/2015 0545   CREATININE 1.19 01/27/2015 1510   GLUCOSE 141* 01/29/2015 0545   GLUCOSE 80 01/27/2015 1510   CALCIUM 8.4* 01/29/2015 0545   CALCIUM 9.0 01/27/2015 1510   AST 22 01/27/2015 1510   ALT 22 01/27/2015 1510   ALKPHOS 68  01/27/2015 1510   BILITOT 0.2* 01/27/2015 1510   PROT 7.0 01/27/2015 1510   ALBUMIN 4.1 01/27/2015 1510    Studies/Results: No results found.  Anti-infectives: Anti-infectives    Start     Dose/Rate Route Frequency Ordered Stop   01/28/15 1500  ceFAZolin (ANCEF) IVPB 2 g/50 mL premix     2 g 100 mL/hr over 30 Minutes Intravenous 3 times per day 01/28/15 1324 01/28/15 2253   01/28/15 0718  ceFAZolin (ANCEF) IVPB 2 g/50 mL premix     2 g 100 mL/hr over 30 Minutes Intravenous On call to O.R. 01/28/15 9798 01/28/15 0912      Assessment & Plans: Status post partial gastrectomy, Billroth II reconstruction  NPO, NG, IV hydration  PCA for pain control  Needs to get OOB, use IS  Await path results  Earnstine Regal, MD, [facility] Office: [place] 01/29/2015

## 2015-01-30 DIAGNOSIS — E119 Type 2 diabetes mellitus without complications: Secondary | ICD-10-CM

## 2015-01-30 LAB — GLUCOSE, CAPILLARY
GLUCOSE-CAPILLARY: 153 mg/dL — AB (ref 65–99)
GLUCOSE-CAPILLARY: 153 mg/dL — AB (ref 65–99)
GLUCOSE-CAPILLARY: 133 mg/dL — AB (ref 65–99)
GLUCOSE-CAPILLARY: 120 mg/dL — AB (ref 65–99)
Glucose-Capillary: 125 mg/dL — ABNORMAL HIGH (ref 65–99)
Glucose-Capillary: 153 mg/dL — ABNORMAL HIGH (ref 65–99)

## 2015-01-30 LAB — BASIC METABOLIC PANEL
ANION GAP: 7 (ref 5–15)
BUN: 9 mg/dL (ref 6–20)
CALCIUM: 8.7 mg/dL — AB (ref 8.9–10.3)
CHLORIDE: 101 mmol/L (ref 101–111)
CO2: 27 mmol/L (ref 22–32)
Creatinine, Ser: 1.07 mg/dL (ref 0.61–1.24)
GFR calc non Af Amer: >60 mL/min (ref >60)
GLUCOSE: 180 mg/dL — AB (ref 65–99)
POTASSIUM: 4 mmol/L (ref 3.5–5.1)
Sodium: 135 mmol/L (ref 135–145)

## 2015-01-30 LAB — CBC
HEMATOCRIT: 39.1 % (ref 39.0–52.0)
HEMOGLOBIN: 12.9 g/dL — AB (ref 13.0–17.0)
MCH: 27.2 pg (ref 26.0–34.0)
MCHC: 33 g/dL (ref 30.0–36.0)
MCV: 82.5 fL (ref 78.0–100.0)
Platelets: 227 10*3/uL (ref 150–400)
RBC: 4.74 MIL/uL (ref 4.22–5.81)
RDW: 14.8 % (ref 11.5–15.5)
WBC: 12.8 10*3/uL — AB (ref 4.0–10.5)

## 2015-01-30 MED ORDER — LIP MEDEX EX OINT
TOPICAL_OINTMENT | CUTANEOUS | Status: AC
  Administered 2015-01-30: 11:00:00
  Filled 2015-01-30: qty 7

## 2015-01-30 NOTE — Progress Notes (Signed)
[name] [name], [name] [address] Phone: [phone] FAX: [phone]   Troy Barrera [id number] 1956-04-04   Problem List:   Active Problems:   Gastric cancer   2 Days Post-Op  01/28/2015  PREOPERATIVE DX: Gastric cancer  POSTOPERATIVE DX: Same  PROCEDURE: Subtotal gastrectomy    Surgeon: Troy Barrera   Assistants: Armandina Gemma, MD   Assessment  Stable  Plan:  -NGT until flatus.  Possible study vs clamping trial -wean IVFs a little -PCA pain control -DM -SSI -f/u path -VTE prophylaxis- SCDs, etc -mobilize as tolerated to help recovery  [name], M.D., F.A.C.S. Gastrointestinal and Minimally Invasive Surgery [facility] [address] [place], [place] [address] [phone] Main / Paging   01/30/2015  Subjective:  Walking more Foley out PCA helping - using 1-2x/hour Wife in room  Objective:  Vital signs:  Filed Vitals:   01/30/15 0153 01/30/15 0432 01/30/15 0455 01/30/15 0800  BP: 177/88  135/76   Pulse: 105  94   Temp: 100.4 F (38 C)  98.9 F (37.2 C)   TempSrc: Oral  Axillary   Resp: '18 16 18 17  ' Height:      Weight:      SpO2: 94% 92% 92% 94%    Last BM Date: 01/28/15  Intake/Output   Yesterday:  09/24 0701 - 09/25 0700 In: 2400 [I.V.:2400] Out: 2900 [Urine:2650; Emesis/NG output:250] This shift:     Bowel function:  Flatus: n  BM: n  Drain: NGT thick.  I unclogged  Physical Exam:  General: Pt awake/alert/oriented x4 in no acute distress Eyes: PERRL, normal EOM.  Sclera clear.  No icterus Neuro: CN II-XII intact w/o focal sensory/motor deficits. Lymph: No head/neck/groin lymphadenopathy Psych:  No delerium/psychosis/paranoia HENT: Normocephalic, Mucus membranes moist.  No thrush.   Neck: Supple, No tracheal deviation Chest: No chest wall pain w good excursion CV:  Pulses intact.  Regular  rhythm MS: Normal AROM mjr joints.  No obvious deformity Abdomen: Soft.  Obese - Mod distended.  Mildly tender at incisions only.  No evidence of peritonitis.  No incarcerated hernias. Ext:  SCDs BLE.  No mjr edema.  No cyanosis Skin: No petechiae / purpura  Results:   Labs: Results for orders placed or performed during the hospital encounter of 01/28/15 (from the past 48 hour(s))  Glucose, capillary     Status: Abnormal   Collection Time: 01/28/15  2:33 PM  Result Value Ref Range   Glucose-Capillary 178 (H) 65 - 99 mg/dL  Glucose, capillary     Status: Abnormal   Collection Time: 01/28/15  4:36 PM  Result Value Ref Range   Glucose-Capillary 166 (H) 65 - 99 mg/dL  Glucose, capillary     Status: Abnormal   Collection Time: 01/28/15  7:56 PM  Result Value Ref Range   Glucose-Capillary 119 (H) 65 - 99 mg/dL  Glucose, capillary     Status: Abnormal   Collection Time: 01/29/15 12:22 AM  Result Value Ref Range   Glucose-Capillary 155 (H) 65 - 99 mg/dL  Glucose, capillary     Status: Abnormal   Collection Time: 01/29/15  4:07 AM  Result Value Ref Range   Glucose-Capillary 130 (H) 65 - 99 mg/dL  CBC     Status: Abnormal   Collection Time: 01/29/15  5:45 AM  Result Value Ref Range   WBC 11.7 (H) 4.0 - 10.5 K/uL   RBC 4.66  4.22 - 5.81 MIL/uL   Hemoglobin 12.8 (L) 13.0 - 17.0 g/dL   HCT 38.3 (L) 39.0 - 52.0 %   MCV 82.2 78.0 - 100.0 fL   MCH 27.5 26.0 - 34.0 pg   MCHC 33.4 30.0 - 36.0 g/dL   RDW 15.1 11.5 - 15.5 %   Platelets 245 150 - 400 K/uL  Basic metabolic panel     Status: Abnormal   Collection Time: 01/29/15  5:45 AM  Result Value Ref Range   Sodium 136 135 - 145 mmol/L   Potassium 3.9 3.5 - 5.1 mmol/L   Chloride 100 (L) 101 - 111 mmol/L   CO2 27 22 - 32 mmol/L   Glucose, Bld 141 (H) 65 - 99 mg/dL   BUN 12 6 - 20 mg/dL   Creatinine, Ser 1.20 0.61 - 1.24 mg/dL   Calcium 8.4 (L) 8.9 - 10.3 mg/dL   GFR calc non Af Amer >60 >60 mL/min   GFR calc Af Amer >60 >60 mL/min     Comment: (NOTE) The eGFR has been calculated using the CKD EPI equation. This calculation has not been validated in all clinical situations. eGFR's persistently <60 mL/min signify possible Chronic Kidney Disease.    Anion gap 9 5 - 15  Glucose, capillary     Status: Abnormal   Collection Time: 01/29/15  8:08 AM  Result Value Ref Range   Glucose-Capillary 151 (H) 65 - 99 mg/dL  Glucose, capillary     Status: Abnormal   Collection Time: 01/29/15 12:42 PM  Result Value Ref Range   Glucose-Capillary 157 (H) 65 - 99 mg/dL  Glucose, capillary     Status: Abnormal   Collection Time: 01/29/15  5:12 PM  Result Value Ref Range   Glucose-Capillary 180 (H) 65 - 99 mg/dL  Glucose, capillary     Status: Abnormal   Collection Time: 01/29/15  8:01 PM  Result Value Ref Range   Glucose-Capillary 186 (H) 65 - 99 mg/dL  Glucose, capillary     Status: Abnormal   Collection Time: 01/30/15 12:20 AM  Result Value Ref Range   Glucose-Capillary 153 (H) 65 - 99 mg/dL  Glucose, capillary     Status: Abnormal   Collection Time: 01/30/15  3:52 AM  Result Value Ref Range   Glucose-Capillary 153 (H) 65 - 99 mg/dL  CBC     Status: Abnormal   Collection Time: 01/30/15  5:26 AM  Result Value Ref Range   WBC 12.8 (H) 4.0 - 10.5 K/uL   RBC 4.74 4.22 - 5.81 MIL/uL   Hemoglobin 12.9 (L) 13.0 - 17.0 g/dL   HCT 39.1 39.0 - 52.0 %   MCV 82.5 78.0 - 100.0 fL   MCH 27.2 26.0 - 34.0 pg   MCHC 33.0 30.0 - 36.0 g/dL   RDW 14.8 11.5 - 15.5 %   Platelets 227 150 - 400 K/uL  Basic metabolic panel     Status: Abnormal   Collection Time: 01/30/15  5:26 AM  Result Value Ref Range   Sodium 135 135 - 145 mmol/L   Potassium 4.0 3.5 - 5.1 mmol/L   Chloride 101 101 - 111 mmol/L   CO2 27 22 - 32 mmol/L   Glucose, Bld 180 (H) 65 - 99 mg/dL   BUN 9 6 - 20 mg/dL   Creatinine, Ser 1.07 0.61 - 1.24 mg/dL   Calcium 8.7 (L) 8.9 - 10.3 mg/dL   GFR calc non Af Amer >60 >60 mL/min   GFR  calc Af Amer >60 >60 mL/min    Comment:  (NOTE) The eGFR has been calculated using the CKD EPI equation. This calculation has not been validated in all clinical situations. eGFR's persistently <60 mL/min signify possible Chronic Kidney Disease.    Anion gap 7 5 - 15  Glucose, capillary     Status: Abnormal   Collection Time: 01/30/15  7:00 AM  Result Value Ref Range   Glucose-Capillary 153 (H) 65 - 99 mg/dL    Imaging / Studies: No results found.  Medications / Allergies: per chart  Antibiotics: Anti-infectives    Start     Dose/Rate Route Frequency Ordered Stop   01/28/15 1500  ceFAZolin (ANCEF) IVPB 2 g/50 mL premix     2 g 100 mL/hr over 30 Minutes Intravenous 3 times per day 01/28/15 1324 01/28/15 2253   01/28/15 0718  ceFAZolin (ANCEF) IVPB 2 g/50 mL premix     2 g 100 mL/hr over 30 Minutes Intravenous On call to O.R. 01/28/15 2563 01/28/15 0912        Note: Portions of this report may have been transcribed using voice recognition software. Every effort was made to ensure accuracy; however, inadvertent computerized transcription errors may be present.   Any transcriptional errors that result from this process are unintentional.     [name], M.D., F.A.C.S. Gastrointestinal and Minimally Invasive Surgery [facility] [address], [place], [place] [address] [phone] Main / Paging   01/30/2015  CARE TEAM:  PCP: Deloria Lair, MD  Outpatient Care Team: Patient Care Team: Zella Richer. Scotty Court, MD as PCP - General (Family Medicine)  Inpatient Treatment Team: Treatment Team: Attending Provider: Jackolyn Confer, MD; Registered Nurse: Mortimer Fries, RN; Technician: Abbe Amsterdam, NT; Registered Nurse: Saunders Glance, RN

## 2015-01-31 LAB — GLUCOSE, CAPILLARY
GLUCOSE-CAPILLARY: 129 mg/dL — AB (ref 65–99)
GLUCOSE-CAPILLARY: 113 mg/dL — AB (ref 65–99)
GLUCOSE-CAPILLARY: 148 mg/dL — AB (ref 65–99)
Glucose-Capillary: 119 mg/dL — ABNORMAL HIGH (ref 65–99)
Glucose-Capillary: 135 mg/dL — ABNORMAL HIGH (ref 65–99)
Glucose-Capillary: 144 mg/dL — ABNORMAL HIGH (ref 65–99)

## 2015-01-31 NOTE — Progress Notes (Signed)
3 Days Post-Op  Subjective: Coughing up greenish sputum.  Pain well controlled with PCA.  Passed a little gas.  Objective: Vital signs in last 24 hours: Temp:  [99.6 F (37.6 C)-100.8 F (38.2 C)] 100.7 F (38.2 C) (09/26 0400) Pulse Rate:  [76-91] 76 (09/26 0400) Resp:  [16-18] 16 (09/26 0404) BP: (116-146)/(63-85) 116/63 mmHg (09/26 0400) SpO2:  [91 %-100 %] 93 % (09/26 0404) Weight:  [68.947 kg (152 lb)] 68.947 kg (152 lb) (09/26 0600) Last BM Date: 01/28/15  Intake/Output from previous day: 09/25 0701 - 09/26 0700 In: 2274.8 [P.O.:60; I.V.:1379.8; NG/GT:360; IV Piggyback:55] Out: 1425 [Urine:1325; Emesis/NG output:100] Intake/Output this shift:    PE: General- In NAD Abdomen-soft, incision is clean and intact, few bowel sounds  Lab Results:   Recent Labs  01/29/15 0545 01/30/15 0526  WBC 11.7* 12.8*  HGB 12.8* 12.9*  HCT 38.3* 39.1  PLT 245 227   BMET  Recent Labs  01/29/15 0545 01/30/15 0526  NA 136 135  K 3.9 4.0  CL 100* 101  CO2 27 27  GLUCOSE 141* 180*  BUN 12 9  CREATININE 1.20 1.07  CALCIUM 8.4* 8.7*   PT/INR No results for input(s): LABPROT, INR in the last 72 hours. Comprehensive Metabolic Panel:    Component Value Date/Time   NA 135 01/30/2015 0526   NA 136 01/29/2015 0545   K 4.0 01/30/2015 0526   K 3.9 01/29/2015 0545   CL 101 01/30/2015 0526   CL 100* 01/29/2015 0545   CO2 27 01/30/2015 0526   CO2 27 01/29/2015 0545   BUN 9 01/30/2015 0526   BUN 12 01/29/2015 0545   CREATININE 1.07 01/30/2015 0526   CREATININE 1.20 01/29/2015 0545   GLUCOSE 180* 01/30/2015 0526   GLUCOSE 141* 01/29/2015 0545   CALCIUM 8.7* 01/30/2015 0526   CALCIUM 8.4* 01/29/2015 0545   AST 22 01/27/2015 1510   ALT 22 01/27/2015 1510   ALKPHOS 68 01/27/2015 1510   BILITOT 0.2* 01/27/2015 1510   PROT 7.0 01/27/2015 1510   ALBUMIN 4.1 01/27/2015 1510     Studies/Results: No results found.  Anti-infectives: Anti-infectives    Start     Dose/Rate  Route Frequency Ordered Stop   01/28/15 1500  ceFAZolin (ANCEF) IVPB 2 g/50 mL premix     2 g 100 mL/hr over 30 Minutes Intravenous 3 times per day 01/28/15 1324 01/28/15 2253   01/28/15 0718  ceFAZolin (ANCEF) IVPB 2 g/50 mL premix     2 g 100 mL/hr over 30 Minutes Intravenous On call to O.R. 01/28/15 0718 01/28/15 0912      Assessment Principal Problem:   Gastric cancer s/p subtotal gastrectomy 01/28/15-some fever which is likely pulmonary in origin; bowel function starting to return Active Problems:   Diabetes mellitus without complication-CBGs well controlled on SSI    LOS: 3 days   Plan: Clamp NG.  Sips of liquids.   [name],[name] 01/31/2015

## 2015-02-01 LAB — GLUCOSE, CAPILLARY
GLUCOSE-CAPILLARY: 148 mg/dL — AB (ref 65–99)
GLUCOSE-CAPILLARY: 141 mg/dL — AB (ref 65–99)
Glucose-Capillary: 111 mg/dL — ABNORMAL HIGH (ref 65–99)
Glucose-Capillary: 130 mg/dL — ABNORMAL HIGH (ref 65–99)
Glucose-Capillary: 130 mg/dL — ABNORMAL HIGH (ref 65–99)
Glucose-Capillary: 182 mg/dL — ABNORMAL HIGH (ref 65–99)
Glucose-Capillary: 89 mg/dL (ref 65–99)

## 2015-02-01 MED ORDER — OXYCODONE HCL 5 MG PO TABS
5.0000 mg | ORAL_TABLET | ORAL | Status: DC | PRN
  Administered 2015-02-01 – 2015-02-02 (×2): 5 mg via ORAL
  Administered 2015-02-02: 10 mg via ORAL
  Administered 2015-02-02: 5 mg via ORAL
  Administered 2015-02-03 (×2): 10 mg via ORAL
  Filled 2015-02-01: qty 1
  Filled 2015-02-01: qty 2
  Filled 2015-02-01 (×2): qty 1
  Filled 2015-02-01 (×2): qty 2
  Filled 2015-02-01: qty 1

## 2015-02-01 MED ORDER — MORPHINE SULFATE (PF) 2 MG/ML IV SOLN
1.0000 mg | INTRAVENOUS | Status: DC | PRN
  Administered 2015-02-01 (×2): 2 mg via INTRAVENOUS
  Filled 2015-02-01 (×2): qty 1

## 2015-02-01 NOTE — Progress Notes (Signed)
4 Days Post-Op  Subjective: Tolerated ng being clamped and taking some clear liquids.  Passing gas.  Walking.  Objective: Vital signs in last 24 hours: Temp:  [98.6 F (37 C)-100 F (37.8 C)] 98.9 F (37.2 C) (09/27 1029) Pulse Rate:  [72-84] 74 (09/27 1029) Resp:  [12-18] 18 (09/27 1029) BP: (111-146)/(65-84) 120/84 mmHg (09/27 1029) SpO2:  [91 %-98 %] 93 % (09/27 1029) Last BM Date: 01/28/15  Intake/Output from previous day: 09/26 0701 - 09/27 0700 In: 1530 [P.O.:210; I.V.:1200; NG/GT:120] Out: 20 [Emesis/NG output:20] Intake/Output this shift: Total I/O In: 120 [NG/GT:120] Out: -   PE: General- In NAD Abdomen-soft, incision is clean and intact  Lab Results:   Recent Labs  01/30/15 0526  WBC 12.8*  HGB 12.9*  HCT 39.1  PLT 227   BMET  Recent Labs  01/30/15 0526  NA 135  K 4.0  CL 101  CO2 27  GLUCOSE 180*  BUN 9  CREATININE 1.07  CALCIUM 8.7*   PT/INR No results for input(s): LABPROT, INR in the last 72 hours. Comprehensive Metabolic Panel:    Component Value Date/Time   NA 135 01/30/2015 0526   NA 136 01/29/2015 0545   K 4.0 01/30/2015 0526   K 3.9 01/29/2015 0545   CL 101 01/30/2015 0526   CL 100* 01/29/2015 0545   CO2 27 01/30/2015 0526   CO2 27 01/29/2015 0545   BUN 9 01/30/2015 0526   BUN 12 01/29/2015 0545   CREATININE 1.07 01/30/2015 0526   CREATININE 1.20 01/29/2015 0545   GLUCOSE 180* 01/30/2015 0526   GLUCOSE 141* 01/29/2015 0545   CALCIUM 8.7* 01/30/2015 0526   CALCIUM 8.4* 01/29/2015 0545   AST 22 01/27/2015 1510   ALT 22 01/27/2015 1510   ALKPHOS 68 01/27/2015 1510   BILITOT 0.2* 01/27/2015 1510   PROT 7.0 01/27/2015 1510   ALBUMIN 4.1 01/27/2015 1510   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  Studies/Results: No results found.  Anti-infectives: Anti-infectives    Start     Dose/Rate Route Frequency Ordered Stop   01/28/15 1500  ceFAZolin (ANCEF) IVPB 2 g/50 mL premix     2  g 100 mL/hr over 30 Minutes Intravenous 3 times per day 01/28/15 1324 01/28/15 2253   01/28/15 0718  ceFAZolin (ANCEF) IVPB 2 g/50 mL premix     2 g 100 mL/hr over 30 Minutes Intravenous On call to O.R. 01/28/15 0718 01/28/15 0912      Assessment Principal Problem:   Troy Barrera s/p subtotal gastrectomy 01/28/15-doing better overall Active Problems:   Diabetes mellitus without complication-CBGs well controlled on SSI    LOS: 4 days   Plan: Remove ng tube.  Full liquids diet.  D/C PCA.   [name],[name] 02/01/2015

## 2015-02-02 LAB — GLUCOSE, CAPILLARY
GLUCOSE-CAPILLARY: 125 mg/dL — AB (ref 65–99)
Glucose-Capillary: 107 mg/dL — ABNORMAL HIGH (ref 65–99)
Glucose-Capillary: 131 mg/dL — ABNORMAL HIGH (ref 65–99)
Glucose-Capillary: 94 mg/dL (ref 65–99)
Glucose-Capillary: 85 mg/dL (ref 65–99)

## 2015-02-02 NOTE — Progress Notes (Signed)
5 Days Post-Op  Subjective: Tolerating full liquid diet.  Having some wet gas.  Objective: Vital signs in last 24 hours: Temp:  [98.9 F (37.2 C)-100.6 F (38.1 C)] 99.3 F (37.4 C) (09/28 0601) Pulse Rate:  [64-78] 64 (09/28 0601) Resp:  [18] 18 (09/28 0601) BP: (119-135)/(77-84) 119/78 mmHg (09/28 0601) SpO2:  [93 %-98 %] 97 % (09/28 0601) Last BM Date: 01/28/15  Intake/Output from previous day: 09/27 0701 - 09/28 0700 In: 1750 [P.O.:360; I.V.:1150; NG/GT:240] Out: -  Intake/Output this shift:    PE: General- In NAD Abdomen-soft, incision is clean and intact  Lab Results:  No results for input(s): WBC, HGB, HCT, PLT in the last 72 hours. BMET No results for input(s): NA, K, CL, CO2, GLUCOSE, BUN, CREATININE, CALCIUM in the last 72 hours. PT/INR No results for input(s): LABPROT, INR in the last 72 hours. Comprehensive Metabolic Panel:    Component Value Date/Time   NA 135 01/30/2015 0526   NA 136 01/29/2015 0545   K 4.0 01/30/2015 0526   K 3.9 01/29/2015 0545   CL 101 01/30/2015 0526   CL 100* 01/29/2015 0545   CO2 27 01/30/2015 0526   CO2 27 01/29/2015 0545   BUN 9 01/30/2015 0526   BUN 12 01/29/2015 0545   CREATININE 1.07 01/30/2015 0526   CREATININE 1.20 01/29/2015 0545   GLUCOSE 180* 01/30/2015 0526   GLUCOSE 141* 01/29/2015 0545   CALCIUM 8.7* 01/30/2015 0526   CALCIUM 8.4* 01/29/2015 0545   AST 22 01/27/2015 1510   ALT 22 01/27/2015 1510   ALKPHOS 68 01/27/2015 1510   BILITOT 0.2* 01/27/2015 1510   PROT 7.0 01/27/2015 1510   ALBUMIN 4.1 01/27/2015 1510   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  Studies/Results: No results found.  Anti-infectives: Anti-infectives    Start     Dose/Rate Route Frequency Ordered Stop   01/28/15 1500  ceFAZolin (ANCEF) IVPB 2 g/50 mL premix     2 g 100 mL/hr over 30 Minutes Intravenous 3 times per day 01/28/15 1324 01/28/15 2253   01/28/15 0718  ceFAZolin (ANCEF) IVPB 2 g/50  mL premix     2 g 100 mL/hr over 30 Minutes Intravenous On call to O.R. 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  Advance diet.  Dietary consult re post gastrectomy diet.  Hopefully home tomorrow.   Troy Barrera 02/02/2015

## 2015-02-03 LAB — GLUCOSE, CAPILLARY
GLUCOSE-CAPILLARY: 99 mg/dL (ref 65–99)
Glucose-Capillary: 94 mg/dL (ref 65–99)
Glucose-Capillary: 94 mg/dL (ref 65–99)

## 2015-02-03 MED ORDER — HYDROMORPHONE HCL 4 MG PO TABS: 4.0000 mg | ORAL_TABLET | ORAL | Status: DC | PRN

## 2015-02-03 MED ORDER — PANTOPRAZOLE SODIUM 40 MG IV SOLR: 40.0000 mg | Freq: Every day | INTRAVENOUS | Status: DC

## 2015-02-03 NOTE — Progress Notes (Signed)
Assessment unchanged. Dietician educated pt per ordered consult prior to discharge. Pt verbalized understanding of dc instructions through teach back including follow up care, medications to resume and when to call the doctor. Dr. Zella Richer notified earlier that IV Protonix printed on AVS. Pt made aware Dr. Zella Richer instructed to disregard Protonix information and to resume home Zantac 150 mg as taken prior to admission. Pt instructed to take Zantac at 1700 daily per Dr. Zella Richer with verbalized understanding. Discharged via wc to front entrance to meet awaiting vehicle to carry home. Accompanied by [employer] student, family and pastor.

## 2015-02-03 NOTE — Progress Notes (Signed)
6 Days Post-Op  Subjective: Tolerated solid food.  Having loose BMs.  Incisional pain not well controlled with Oxycodone.  Did not see nutritionist re post gastrectomy diet.  Objective: Vital signs in last 24 hours: Temp:  [98.6 F (37 C)-99.5 F (37.5 C)] 99.5 F (37.5 C) (09/29 0600) Pulse Rate:  [55] 55 (09/29 0600) Resp:  [16-18] 16 (09/29 0600) BP: (115-134)/(75-78) 115/75 mmHg (09/29 0600) SpO2:  [97 %-100 %] 97 % (09/29 0600) Last BM Date: 02/02/15  Intake/Output from previous day: 09/28 0701 - 09/29 0700 In: 120 [P.O.:120] Out: 325 [Urine:325] Intake/Output this shift:    PE: General- In NAD Abdomen-soft, incision is clean and intact  Lab Results:  No results for input(s): WBC, HGB, HCT, PLT in the last 72 hours. BMET No results for input(s): NA, K, CL, CO2, GLUCOSE, BUN, CREATININE, CALCIUM in the last 72 hours. PT/INR No results for input(s): LABPROT, INR in the last 72 hours. Comprehensive Metabolic Panel:    Component Value Date/Time   NA 135 01/30/2015 0526   NA 136 01/29/2015 0545   K 4.0 01/30/2015 0526   K 3.9 01/29/2015 0545   CL 101 01/30/2015 0526   CL 100* 01/29/2015 0545   CO2 27 01/30/2015 0526   CO2 27 01/29/2015 0545   BUN 9 01/30/2015 0526   BUN 12 01/29/2015 0545   CREATININE 1.07 01/30/2015 0526   CREATININE 1.20 01/29/2015 0545   GLUCOSE 180* 01/30/2015 0526   GLUCOSE 141* 01/29/2015 0545   CALCIUM 8.7* 01/30/2015 0526   CALCIUM 8.4* 01/29/2015 0545   AST 22 01/27/2015 1510   ALT 22 01/27/2015 1510   ALKPHOS 68 01/27/2015 1510   BILITOT 0.2* 01/27/2015 1510   PROT 7.0 01/27/2015 1510   ALBUMIN 4.1 01/27/2015 1510   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  Studies/Results: No results found.  Anti-infectives: Anti-infectives    Start     Dose/Rate Route Frequency Ordered Stop   01/28/15 1500  ceFAZolin (ANCEF) IVPB 2 g/50 mL premix     2 g 100 mL/hr over 30 Minutes Intravenous 3 times  per day 01/28/15 1324 01/28/15 2253   01/28/15 0718  ceFAZolin (ANCEF) IVPB 2 g/50 mL premix     2 g 100 mL/hr over 30 Minutes Intravenous On call to O.R. 01/28/15 0718 01/28/15 0912      Assessment Principal Problem:   T2N3a gastric cancer s/p subtotal gastrectomy 01/28/15-continues to do well; discussed pathology with him and his daughter Active Problems:   Diabetes mellitus without complication-CBGs well controlled on SSI    LOS: 6 days   Plan: Discharge after seeing nutritionist.  Outpatient referral to Oncology.  Discharge instructions given to him.  [name],[name] 02/03/2015

## 2015-02-03 NOTE — Discharge Instructions (Signed)
[facility], [place] [phone]  OPEN ABDOMINAL SURGERY: POST OP INSTRUCTIONS  Always review your discharge instruction sheet given to you by the facility where your surgery was performed.  IF YOU HAVE DISABILITY OR FAMILY LEAVE FORMS, YOU MUST BRING THEM TO THE OFFICE FOR PROCESSING.  PLEASE DO NOT GIVE THEM TO YOUR DOCTOR.  1. A prescription for pain medication may be given to you upon discharge.  Take your pain medication as prescribed, if needed.  If narcotic pain medicine is not needed, then you may take acetaminophen (Tylenol) or ibuprofen (Advil) as needed. 2. Take your usually prescribed medications unless otherwise directed. 3. If you need a refill on your pain medication, please contact your pharmacy. They will contact our office to request authorization.  Prescriptions will not be filled after 5pm or on week-ends. 4. You should follow a soft diet.  Be sure to include lots of fluids daily.Most patients will experience some swelling and bruising in the area of the incision. Ice pack will help. Swelling and bruising can take several days to resolve..  5. It is common to experience some constipation if taking pain medication after surgery.  Increasing fluid intake and taking a stool softener will usually help or prevent this problem from occurring.  A mild laxative (Milk of Magnesia or Miralax) should be taken according to package directions if there are no bowel movements after 48 hours. 6.  You may have steri-strips (small skin tapes) in place directly over the incision.  These strips should be left on the skin for 7-10 days.  If your surgeon used skin glue on the incision, you may shower in 24 hours.  The glue will flake off over the next 2-3 weeks.  Any sutures or staples will be removed at the office during your follow-up visit. You may find that a light gauze bandage over your incision may keep your staples from being rubbed or pulled. You may shower and replace the  bandage daily. 7. ACTIVITIES:  You may resume regular (light) daily activities beginning the next day--such as daily self-care, walking, climbing stairs--gradually increasing activities as tolerated.  You may have sexual intercourse when it is comfortable.  Refrain from any heavy lifting or straining-nothing over 10 pounds for 6 weeks. a. You may drive when you no longer are taking prescription pain medication, you can comfortably wear a seatbelt, and you can safely maneuver your car and apply brakes b. Return to Work: ___________________________________ 8. You should see your doctor in the office for a follow-up appointment approximately two weeks after your surgery.  Make sure that you call for this appointment within a day or two after you arrive home to insure a convenient appointment time. OTHER INSTRUCTIONS:  _My office will call you to arrange for your staple removal.____________________________________________________________ _____________________________________________________________  WHEN TO CALL YOUR DOCTOR: 1. Fever over 101.0 2. Inability to urinate 3. Nausea and/or vomiting 4. Extreme swelling or bruising 5. Continued bleeding from incision. 6. Increased pain, redness, or drainage from the incision. 7. Difficulty swallowing or breathing 8. Muscle cramping or spasms. 9. Numbness or tingling in hands or feet or around lips.  The clinic staff is available to answer your questions during regular business hours.  Please dont hesitate to call and ask to speak to one of the nurses if you have concerns.  For further questions, please visit [email or web address]

## 2015-02-07 NOTE — Discharge Summary (Signed)
Physician Discharge Summary  Patient ID: Troy Barrera MRN: [id number] DOB/AGE: [birth year] 59 y.o.  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  He was started on a limited diet on POD #3 and the diet was slowly advance.  Bowel function returned.  Pathology was explained to him and his family with plans for an outpatient medical oncology consultation.  His blood sugars were kept under good control.  He was able to be discharged on POD #6.  Discharge instructions were given to him and his family.   Discharge Exam: Blood pressure 112/70, pulse 51, temperature 98.6 F (37 C), temperature source Oral, resp. rate 19, height 5\' 9"  (1.753 m), weight 68.947 kg (152 lb), SpO2 97 %.   Disposition: 01-Home or Self Care     Medication List    STOP taking these medications        ranitidine 150 MG tablet  Commonly known as:  ZANTAC      TAKE these medications        glipiZIDE 5 MG 24 hr tablet  Commonly known as:  GLUCOTROL XL  TAKE ONE TABLET BY MOUTH EVERY MORNING AT BREAKFAST FOR DIABETES     HYDROmorphone 4 MG tablet  Commonly known as:  DILAUDID  Take 1 tablet (4 mg total) by mouth every 4 (four) hours as needed for severe pain.     multivitamin with minerals Tabs tablet  Take 1 tablet by mouth daily.     pantoprazole 40 MG injection  Commonly known as:  PROTONIX  Inject 40 mg into the vein at bedtime.         Signed: [name] 02/07/2015, 9:41 AM

## 2015-02-09 ENCOUNTER — Telehealth: Payer: Self-pay | Admitting: Oncology

## 2015-02-09 ENCOUNTER — Telehealth: Payer: Self-pay | Admitting: *Deleted

## 2015-02-09 DIAGNOSIS — C162 Malignant neoplasm of body of stomach: Secondary | ICD-10-CM

## 2015-02-09 NOTE — Telephone Encounter (Signed)
Called patient with his GI Troy Barrera appointment for 02/11/15 to arrive at 0830 to see Dr. Benay Spice and Dr. Lisbeth Renshaw. Made him aware that his case was presented in GI Conference today and and felt it would be to his benefit to hear what radiation oncology could offer him along with medical oncology to prevent his cancer from recurring. He was very hesitant to speak with nurse and would not agree to this until he discusses it with his daughter. Provided my contact information to return call. Will send referral to radiation oncology and tentatively schedule him to see Dr. Lisbeth Renshaw.

## 2015-02-09 NOTE — Telephone Encounter (Signed)
GI [facility]-s/w patient and gave appt for 10/07 w/arrival time of 8:15.

## 2015-02-11 ENCOUNTER — Ambulatory Visit: Payer: Managed Care, Other (non HMO) | Admitting: Nutrition

## 2015-02-11 ENCOUNTER — Encounter: Payer: Self-pay | Admitting: *Deleted

## 2015-02-11 ENCOUNTER — Telehealth: Payer: Self-pay | Admitting: *Deleted

## 2015-02-11 ENCOUNTER — Telehealth: Payer: Self-pay | Admitting: Oncology

## 2015-02-11 ENCOUNTER — Ambulatory Visit
Admission: RE | Admit: 2015-02-11 | Discharge: 2015-02-11 | Disposition: A | Discharge status: Home / Self Care | Payer: Managed Care, Other (non HMO) | Source: Ambulatory Visit | Attending: Radiation Oncology | Admitting: Radiation Oncology

## 2015-02-11 ENCOUNTER — Ambulatory Visit (HOSPITAL_BASED_OUTPATIENT_CLINIC_OR_DEPARTMENT_OTHER): Payer: Managed Care, Other (non HMO) | Admitting: Oncology

## 2015-02-11 ENCOUNTER — Encounter: Payer: Self-pay | Admitting: Oncology

## 2015-02-11 ENCOUNTER — Ambulatory Visit: Payer: Managed Care, Other (non HMO) | Attending: Oncology | Admitting: Physical Therapy

## 2015-02-11 ENCOUNTER — Encounter: Payer: Self-pay | Admitting: Radiation Oncology

## 2015-02-11 VITALS — BP 113/71 | HR 52 | Temp 99.0°F | Resp 18 | Ht 69.0 in | Wt 143.2 lb

## 2015-02-11 DIAGNOSIS — L905 Scar conditions and fibrosis of skin: Secondary | ICD-10-CM | POA: Diagnosis present

## 2015-02-11 DIAGNOSIS — C162 Malignant neoplasm of body of stomach: Secondary | ICD-10-CM

## 2015-02-11 DIAGNOSIS — E119 Type 2 diabetes mellitus without complications: Secondary | ICD-10-CM | POA: Diagnosis not present

## 2015-02-11 DIAGNOSIS — C166 Malignant neoplasm of greater curvature of stomach, unspecified: Secondary | ICD-10-CM

## 2015-02-11 NOTE — Progress Notes (Signed)
Patient was seen in GI clinic.  59 year old man diagnosed with gastric cancer status post subtotal gastrectomy on September 23.  He is a patient of Dr. Julieanne Manson.  Past medical history includes diabetes and GERD.  Medications include Glucotrol XL, multivitamin, and Protonix.  Labs were reviewed.  Height: 69 inches. Weight: 143 pounds October 7. Usual body weight: 163 pounds. BMI: 21.14.  Patient reports he is trying to eat a greater variety of foods. He would like to gain weight but complains of early satiety. Denies constipation. Patient has been consuming 8 ounces of Ensure Plus at a time which is contributing to feeling of early satiety.  Nutrition diagnosis: Food and nutrition related knowledge deficit related to recent gastrectomy secondary to gastric cancer as evidenced by no prior need for nutrition related information.  Intervention:  Educated patient on diet to follow status post gastrectomy. Enforced importance of small frequent meals and snacks at this times daily. Educated patient to limit fluid intake to no more than 4 ounces with mealtimes. Educated patient on following a low concentrated sweet diet to avoid dumping syndrome. Provided a fact sheets and answered questions. Teach back method was used and contact information was provided.  Monitoring, evaluation, goals: Patient will tolerate adequate calories and protein to promote weight gain.  Next visit: To be scheduled with treatment.  **Disclaimer: This note was dictated with voice recognition software. Similar sounding words can inadvertently be transcribed and this note may contain transcription errors which may not have been corrected upon publication of note.**
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
## 2015-02-11 NOTE — Progress Notes (Signed)
[place] Patient Consult   Referring MD: Commodore Bellew 59 y.o.  [birth year]    Reason for Referral: Gastric cancer   HPI: He reports a 2 month history of postprandial nausea and vomiting. He saw Dr. Scotty Court and was referred to gastroenterology. He was taken to an upper endoscopy by Dr. Britta Mccreedy on 12/10/2014. An ulcerated mass measuring 3 x 4 cm was noted on the greater curvature of the stomach. Multiple biopsies were performed. The pathology revealed adenocarcinoma. He was referred for staging CTs of the chest, abdomen, and pelvis on 12/10/2014. 2 filling defects in the distal trachea was felt to potentially represent secretions. Mild changes of emphysema. A right middle lobe nodule measured 3 mm, a left lower lobe nodule measured 5 mm. Multiple small lymph nodes along the greater curvature of stomach. No retroperitoneal adenopathy. No ascites.  He was referred to Dr. Ival Bible and was taken the operating room for a subtotal gastrectomy on 01/28/2015. No liver lesions or peritoneal nodules were seen. The tumor was palpable in the body of the greater curvature. Approximately 75% of the stomach was removed. 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 The tumor was histologic grade 3, lymphovascular invasion was not present. Perineural invasion was present. The resection margins were negative. 8 of 19 lymph nodes contained metastatic carcinoma. The tumor returned positive for HER-2 amplification by FISH.  He reports early satiety since surgery. No more vomiting.  Past Medical History  Diagnosis Date  . Diabetes mellitus without complication   . GERD (gastroesophageal reflux disease)   . Cancer  01/28/2015     gastric cancer  T2, N3a    Past Surgical History  Procedure Laterality Date  . No past surgeries    . Partial gastrectomy N/A 01/28/2015    Procedure: PARTIAL GASTRECTOMY;  Surgeon:  Jackolyn Confer, MD;  Location: [facility];  Service: General;  Laterality: N/A;    Medications: Reviewed  Allergies: No Known Allergies  Family history: He has 4 brothers and 4 sisters. No family history of cancer.  Social History:   He lives alone in [place]. He works in a Estate agent. He quit smoking cigarettes 2 months ago. He drinks beer on the weekends. No risk factor for HIV or hepatitis.   ROS:   Positives include: Early satiety following surgery, 10 pound weight loss, nausea and vomiting following meals prior to surgery, postoperative abdominal pain  A complete ROS was otherwise negative.  Physical Exam:  Blood pressure 113/71, pulse 44, temperature 99 F (37.2 C), temperature source Oral, resp. rate 18, height _0  (1.753 m), weight 143 lb 3.2 oz (64.955 kg), SpO2 100 %.  HEENT: Oropharynx without visible mass, neck without mass Lungs: Clear bilaterally Cardiac: Regular rate and rhythm Abdomen: No hepatomegaly, midline incision with Steri-Strips in place, no mass GU: Testes without mass  Vascular: No leg edema Lymph nodes: No cervical, supraclavicular, axillary, or inguinal nodes Neurologic: Alert and oriented, the motor exam appears intact in the upper and lower extremities Skin: No rash Musculoskeletal: No spine tenderness   LAB:  CBC  Lab Results  Component Value Date   WBC 12.8* 01/30/2015   HGB 12.9* 01/30/2015   HCT 39.1 01/30/2015   MCV 82.5 01/30/2015   PLT 227 01/30/2015   NEUTROABS 2.2 01/27/2015     CMP      Component Value Date/Time   NA 135 01/30/2015 0526   K 4.0 01/30/2015 0526  CL 101 01/30/2015 0526   CO2 27 01/30/2015 0526   GLUCOSE 180* 01/30/2015 0526   BUN 9 01/30/2015 0526   CREATININE 1.07 01/30/2015 0526   CALCIUM 8.7* 01/30/2015 0526   PROT 7.0 01/27/2015 1510   ALBUMIN 4.1 01/27/2015 1510   AST 22 01/27/2015 1510   ALT 22 01/27/2015 1510   ALKPHOS 68 01/27/2015 1510   BILITOT 0.2* 01/27/2015 1510    GFRNONAA >60 01/30/2015 0526   GFRAA >60 01/30/2015 0526     Imaging:  As per history of present illness   Assessment/Plan:   1. 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 Diabetes   Disposition:   Mr. Carneal has been diagnosed with gastric cancer. He underwent a subtotal gastrectomy on 01/28/2015. He met with the [place] nutritionist today.  I reviewed the details of the surgical pathology report with Mr. Koepp and his daughter. He is at high risk of developing recurrent gastric cancer over the next several years. I recommend adjuvant therapy based on data confirming a benefit from adjuvant chemotherapy/radiation.Marland Kitchen He saw Dr. Lisbeth Renshaw. The plan is to complete adjuvant 5 fluorouracil and radiation. He will complete 1 cycle of weekly 5-FU/leucovorin prior to concurrent capecitabine and radiation. This will be followed by additional 5-FU/leucovorin.  I reviewed the potential toxicities associated with the 5-FU/leucovorin regimen including the chance for nausea, mucositis, diarrhea, hematologic toxicity, rash, sun sensitivity, and hyperpigmentation. He agrees to proceed.  Mr. Sires will be scheduled for a first treatment with chemotherapy on 02/18/2015. He will return for an office visit and week #2 on 02/25/2015.   Approximately 50 minutes were spent with the patient today. The majority of the time was used for counseling and coordination of care.  [place], [place] 02/11/2015, 8:43 AM

## 2015-02-11 NOTE — Therapy (Signed)
[facility] [address], [place], [place], [address] Phone: [phone]   Fax:  [phone]  Physical Therapy Evaluation  Patient Details  Name: Troy Barrera MRN: [id number] Date of Birth: 12-Nov-1955 Referring Provider:  Ladell Pier, MD  Encounter Date: 02/11/2015      PT End of Session - 02/11/15 1054    Visit Number 1   PT Start Time 1025   PT Stop Time 1046   PT Time Calculation (min) 21 min   Activity Tolerance Patient tolerated treatment well   Behavior During Therapy [facility] for tasks assessed/performed      Past Medical History  Diagnosis Date  . Diabetes mellitus without complication   . GERD (gastroesophageal reflux disease)   . Cancer     gastric cancer    Past Surgical History  Procedure Laterality Date  . No past surgeries    . Partial gastrectomy N/A 01/28/2015    Procedure: PARTIAL GASTRECTOMY;  Surgeon: Jackolyn Confer, MD;  Location: [facility];  Service: General;  Laterality: N/A;    There were no vitals filed for this visit.  Visit Diagnosis:  Scar of abdominal skin - Plan: PT plan of care cert/re-cert      Subjective Assessment - 02/11/15 1047    Subjective Patient is attending GI clinic aftera having surgery for gastric cancer on 01/28/2015.  Patient was having abdominal pain with weight loss prior to surgery.     Patient is accompained by: Family member  daughter   Patient Stated Goals educated on scar massage and walking program   Currently in Pain? Yes   Pain Score 6    Pain Location Abdomen   Pain Orientation Mid   Pain Descriptors / Indicators Sore   Pain Type Surgical pain   Pain Onset In the past 7 days   Pain Frequency Intermittent   Aggravating Factors  trunk extension, at night   Pain Relieving Factors medication at night   Multiple Pain Sites No            [facility] PT Assessment - 02/11/15 0001    Assessment   Medical Diagnosis Gastric Cancer   Onset  Date/Surgical Date 01/28/15   Prior Therapy None   Precautions   Precautions Other (comment)   Precaution Comments 10 pound lifting restriction; no ultrasound   Restrictions   Weight Bearing Restrictions No   Balance Screen   Has the patient fallen in the past 6 months No   Has the patient had a decrease in activity level because of a fear of falling?  No   Is the patient reluctant to leave their home because of a fear of falling?  No   Prior Function   Level of Independence Independent   Vocation Full time employment   Leisure walking   Cognition   Overall Cognitive Status Within Functional Limits for tasks assessed   Observation/Other Assessments   Skin Integrity Surgical site is healing with sterri strips   Focus on Therapeutic Outcomes (FOTO)  Therapist discretion is 10% limitation due to 10 pound lifting restriction and not working yet   AROM   Lumbar Extension decreased by 75% wit pain on surgical site                           PT Education - 02/11/15 1054    Education provided Yes   Education Details walking program, scar massage, no trunk extension till  scar is healed fully   Person(s) Educated Patient   Methods Explanation;Demonstration;Verbal cues;Handout   Comprehension Returned demonstration;Verbalized understanding             PT Long Term Goals - 02/11/15 1059    PT LONG TERM GOAL #1   Title education on scar massage   Time 1   Period Days   Status Achieved   PT LONG TERM GOAL #2   Title education on walking program   Time 1   Period Days   Status Achieved               Plan - 02/11/15 1055    Clinical Impression Statement Patient is a 59 year old male with diagnosis of Gastric Cancer.  Patient had surgery on 01/28/2015.  Patient is presently attending GI clinic. Patient has a surgical site with sterri strips.  Patient has 10 pound lifing restriction.  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  Lumbar extension decreased by 75% due to pain in abdomen.  Patient benefits from physical therapy to be placed on walking program and scar massage.    Pt will benefit from skilled therapeutic intervention in order to improve on the following deficits Pain;Decreased mobility;Decreased activity tolerance   Rehab Potential Excellent   Clinical Impairments Affecting Rehab Potential None   PT Frequency 1x / week   PT Duration --  1 time visit   PT Treatment/Interventions Therapeutic exercise;Patient/family education   PT Next Visit Plan 1 time visit in GI clinic   PT Home Exercise Plan Current HEP   Recommended Other Services None   Consulted and Agree with Plan of Care Patient  daughter         Problem List Patient Active Problem List   Diagnosis Date Noted  . Diabetes mellitus without complication ([place])   . Gastric cancer s/p subtotal gastrectomy 01/28/15 01/28/2015    Jonta Gastineau,PT 02/11/2015, 11:01 AM  [facility] [address], [place], [place], [address] Phone: [phone]   Fax:  [phone]

## 2015-02-11 NOTE — Patient Instructions (Signed)
Scar Massage  Scar massage is done to improve the mobility of scar, decrease scar tissue from building up, reduce adhesions, and prevent Keloids from forming. Start scar massage after scabs have fallen off by themselves and no open areas. The first few weeks after surgery, it is normal for a scar to appear pink or red and slightly raised. Scars can itch or have areas of numbness. Some scars may be sensitive.   Direct Scar massage: after scar is healed, no opening, no scab 1.  Place pads of two fingers together directly on the scar starting at one end of the scar. Move the fingers up and down across the scar holding 5 seconds one direction.  Then go opposite direction hold 5 seconds.  2. Move over to the next section of the scar and repeat.  Work your way along the entire length of the scar.   3. Next make diagonal movements along the scar holding 5 seconds at one direction. 4. Next movement is side to side. 5. Do not rub fingers over the scar.  Instead keep firm pressure and move scar over the tissue it is on top   Scar Lift and Roll 12 weeks after surgery. 1. Pinch a small amount of the scar between your first two fingers and thumb.  2. Roll the scar between your fingers for 5 to 15 seconds. 3. Move along the scar and repeat until you have massaged the entire length of scar.   Stop the massage and call your doctor if you notice: 1. Increased redness 2. Bleeding from scar 3. Seepage coming from the scar 4. Scar is warmer and has increased pain    Ways to get started on an exercise program 1.  Start for 10 minutes per day with a walking program. 2. Work towards 30 minutes of exercise per day 3. When you do an aerobic exercise program start on a low level 4. Water aerobics is a good place due to decreased strain on your joints 5. Begin your exercise program gradually and progress slowly over time 6. When exercising use correct form. a. Keep neutral spine b. Engage abdominals c. Keep  chest up  d. Chin down e. Do not lock your knees  Earlie Counts, PT Outpatient Rehab at [facility] [address] [place], [place] [address] [phone]

## 2015-02-11 NOTE — Telephone Encounter (Signed)
Per staff message and POF I have scheduled appts. Advised scheduler of appts. JMW  

## 2015-02-11 NOTE — Progress Notes (Signed)
[facility] Psychosocial Distress Screening Clinical Social Work  CSW had planned to meet pt at [facility], but pt left prior to meeting with CSW. Clinical Social Work was referred by distress screening protocol.  The patient scored a 4 on the Psychosocial Distress Thermometer which indicates mild distress. Clinical Social Worker phoned pt to assess for distress and other psychosocial needs. Pt denies any needs, reports his daughter will be bringing him and should not need transportation. CSW encouraged pt to reach out as needed.   ONCBCN DISTRESS SCREENING 02/11/2015  Screening Type Initial Screening  Distress experienced in past week (1-10) 4  Practical problem type Food  Emotional problem type Adjusting to illness  Information Concerns Type Lack of info about treatment  Physical Problem type Skin dry/itchy  Physician notified of physical symptoms Yes  Referral to clinical social work Yes  Referral to support programs Yes    Clinical Social Worker follow up needed: No.  If yes, follow up plan: Loren Racer, [place]  [facility] Phone: [phone] Fax: [phone]

## 2015-02-11 NOTE — Telephone Encounter (Signed)
s.w pt and he advised me to call dtr....lvm for dtr with OCT appts

## 2015-02-11 NOTE — Progress Notes (Signed)
Oncology Nurse Navigator Documentation  Oncology Nurse Navigator Flowsheets 02/11/2015  Referral date to RadOnc/MedOnc 02/03/2015  Navigator Encounter Type Clinic/MDC  Patient Visit Type Medonc;Radonc  Treatment Phase Treatment p;ammomg  Barriers/Navigation Needs Family concerns;Education  Education Understanding Cancer/ Treatment Options;Newly Diagnosed Cancer Education;Preparing for treatment  Interventions Referrals;Education Method  Referrals Chemo class-  Education Method Verbal;Written  Support Groups/Services GI;ACS for [place]  Time Spent with Patient 30  Met with patient and daughter, Gist after new patient clinic visit. Explained the role of the GI Nurse Navigator and provided New Patient Packet with information on: 1. Gastric cancer 2. Support groups 3. Advanced Directives 4. Fall Safety Plan Answered questions, reviewed current treatment plan using TEACH back and provided emotional support. Provided copy of current treatment plan. Seen in clinic by dietician and physical therapy. He declined to see CSW. Did not want to stay for a chemo class today. He was very anxious to leave-seemed overwhelmed with all the information he received today. Daughter requests to be called with all his appointments and she will relay them to him. Requesting Friday mornings (early as possible) for all appointments. He wishes to have chemo class on day of 1st treatment. Will be trying to work during treatment. 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.  Merceda Elks, RN, BSN GI Oncology [place]

## 2015-02-13 ENCOUNTER — Other Ambulatory Visit: Payer: Self-pay | Admitting: Oncology

## 2015-02-16 ENCOUNTER — Other Ambulatory Visit: Payer: Self-pay | Admitting: *Deleted

## 2015-02-18 ENCOUNTER — Other Ambulatory Visit: Payer: Self-pay | Admitting: Oncology

## 2015-02-18 ENCOUNTER — Encounter: Payer: Self-pay | Admitting: *Deleted

## 2015-02-18 ENCOUNTER — Encounter (HOSPITAL_BASED_OUTPATIENT_CLINIC_OR_DEPARTMENT_OTHER): Payer: Managed Care, Other (non HMO) | Admitting: Oncology

## 2015-02-18 ENCOUNTER — Other Ambulatory Visit: Payer: Managed Care, Other (non HMO)

## 2015-02-18 ENCOUNTER — Ambulatory Visit: Payer: Managed Care, Other (non HMO) | Admitting: Nutrition

## 2015-02-18 ENCOUNTER — Ambulatory Visit (HOSPITAL_BASED_OUTPATIENT_CLINIC_OR_DEPARTMENT_OTHER): Payer: Managed Care, Other (non HMO)

## 2015-02-18 VITALS — BP 120/69 | HR 45 | Temp 97.9°F | Resp 18

## 2015-02-18 DIAGNOSIS — C162 Malignant neoplasm of body of stomach: Secondary | ICD-10-CM | POA: Diagnosis not present

## 2015-02-18 DIAGNOSIS — Z5111 Encounter for antineoplastic chemotherapy: Secondary | ICD-10-CM

## 2015-02-18 DIAGNOSIS — C166 Malignant neoplasm of greater curvature of stomach, unspecified: Secondary | ICD-10-CM

## 2015-02-18 LAB — CBC WITH DIFFERENTIAL/PLATELET
BASO%: 1 % (ref 0.0–2.0)
Basophils Absolute: 0 10*3/uL (ref 0.0–0.1)
EOS ABS: 0.1 10*3/uL (ref 0.0–0.5)
EOS%: 2.1 % (ref 0.0–7.0)
HEMATOCRIT: 38.3 % — AB (ref 38.4–49.9)
HEMOGLOBIN: 12.5 g/dL — AB (ref 13.0–17.1)
LYMPH#: 2 10*3/uL (ref 0.9–3.3)
LYMPH%: 55.8 % — ABNORMAL HIGH (ref 14.0–49.0)
MCH: 27 pg — ABNORMAL LOW (ref 27.2–33.4)
MCHC: 32.7 g/dL (ref 32.0–36.0)
MCV: 82.6 fL (ref 79.3–98.0)
MONO#: 0.4 10*3/uL (ref 0.1–0.9)
MONO%: 11.4 % (ref 0.0–14.0)
NEUT%: 29.7 % — ABNORMAL LOW (ref 39.0–75.0)
NEUTROS ABS: 1 10*3/uL — AB (ref 1.5–6.5)
PLATELETS: 358 10*3/uL (ref 140–400)
RBC: 4.63 10*6/uL (ref 4.20–5.82)
RDW: 14.3 % (ref 11.0–14.6)
WBC: 3.5 10*3/uL — AB (ref 4.0–10.3)

## 2015-02-18 LAB — COMPREHENSIVE METABOLIC PANEL (CC13)
ALBUMIN: 3.8 g/dL (ref 3.5–5.0)
ALT: 22 U/L (ref 0–55)
AST: 18 U/L (ref 5–34)
Alkaline Phosphatase: 74 U/L (ref 40–150)
Anion Gap: 7 mEq/L (ref 3–11)
BILIRUBIN TOTAL: 0.69 mg/dL (ref 0.20–1.20)
BUN: 11.4 mg/dL (ref 7.0–26.0)
CALCIUM: 9 mg/dL (ref 8.4–10.4)
CHLORIDE: 110 meq/L — AB (ref 98–109)
CO2: 23 mEq/L (ref 22–29)
CREATININE: 1 mg/dL (ref 0.7–1.3)
EGFR: >90 mL/min/{1.73_m2} (ref >90)
Glucose: 103 mg/dl (ref 70–140)
Potassium: 4 mEq/L (ref 3.5–5.1)
Sodium: 141 mEq/L (ref 136–145)
TOTAL PROTEIN: 7.1 g/dL (ref 6.4–8.3)

## 2015-02-18 LAB — CEA: CEA: 2.3 ng/mL (ref 0.0–5.0)

## 2015-02-18 MED ORDER — SODIUM CHLORIDE 0.9 % IJ SOLN
10.0000 mL | INTRAMUSCULAR | Status: DC | PRN
  Filled 2015-02-18: qty 10

## 2015-02-18 MED ORDER — SODIUM CHLORIDE 0.9 % IV SOLN
Freq: Once | INTRAVENOUS | Status: AC
  Administered 2015-02-18: 13:00:00 via INTRAVENOUS

## 2015-02-18 MED ORDER — HEPARIN SOD (PORK) LOCK FLUSH 100 UNIT/ML IV SOLN
500.0000 [IU] | Freq: Once | INTRAVENOUS | Status: DC | PRN
  Filled 2015-02-18: qty 5

## 2015-02-18 MED ORDER — SODIUM CHLORIDE 0.9 % IV SOLN
400.0000 mg/m2 | Freq: Once | INTRAVENOUS | Status: AC
  Administered 2015-02-18: 712 mg via INTRAVENOUS
  Filled 2015-02-18: qty 35.6

## 2015-02-18 MED ORDER — FLUOROURACIL CHEMO INJECTION 2.5 GM/50ML
400.0000 mg/m2 | Freq: Once | INTRAVENOUS | Status: AC
  Administered 2015-02-18: 700 mg via INTRAVENOUS
  Filled 2015-02-18: qty 14

## 2015-02-18 MED ORDER — PROCHLORPERAZINE MALEATE 10 MG PO TABS
10.0000 mg | ORAL_TABLET | Freq: Once | ORAL | Status: AC
  Administered 2015-02-18: 10 mg via ORAL

## 2015-02-18 MED ORDER — PROCHLORPERAZINE MALEATE 10 MG PO TABS
ORAL_TABLET | ORAL | Status: AC
  Filled 2015-02-18: qty 1

## 2015-02-18 NOTE — Progress Notes (Signed)
Ok to treat with ANC 1.0 per Dr. Benay Spice. Dose reduced for 5FU and Leucovorin today. Pt and family aware of lab results.

## 2015-02-18 NOTE — Patient Instructions (Signed)
[facility] Discharge Instructions for Patients Receiving Chemotherapy  Today you received the following chemotherapy agents Leucovorin, Fluorouracil.  To help prevent nausea and vomiting after your treatment, we encourage you to take your nausea medication compazine as needed    If you develop nausea and vomiting that is not controlled by your nausea medication, call the clinic.   BELOW ARE SYMPTOMS THAT SHOULD BE REPORTED IMMEDIATELY:  *FEVER GREATER THAN 100.5 F  *CHILLS WITH OR WITHOUT FEVER  NAUSEA AND VOMITING THAT IS NOT CONTROLLED WITH YOUR NAUSEA MEDICATION  *UNUSUAL SHORTNESS OF BREATH  *UNUSUAL BRUISING OR BLEEDING  TENDERNESS IN MOUTH AND THROAT WITH OR WITHOUT PRESENCE OF ULCERS  *URINARY PROBLEMS  *BOWEL PROBLEMS  UNUSUAL RASH Items with * indicate a potential emergency and should be followed up as soon as possible.  Feel free to call the clinic you have any questions or concerns. The clinic phone number is [phone].  Please show the [facility] at check-in to the Emergency Department and triage nurse.   Fluorouracil, 5-FU injection What is this medicine? FLUOROURACIL, 5-FU (flure oh YOOR a sil) is a chemotherapy drug. It slows the growth of cancer cells. This medicine is used to treat many types of cancer like breast cancer, colon or rectal cancer, pancreatic cancer, and stomach cancer. This medicine may be used for other purposes; ask your health care provider or pharmacist if you have questions. What should I tell my health care provider before I take this medicine? They need to know if you have any of these conditions: -blood disorders -dihydropyrimidine dehydrogenase (DPD) deficiency -infection (especially a virus infection such as chickenpox, cold sores, or herpes) -kidney disease -liver disease -malnourished, poor nutrition -recent or ongoing radiation therapy -an unusual or allergic reaction to fluorouracil, other  chemotherapy, other medicines, foods, dyes, or preservatives -pregnant or trying to get pregnant -breast-feeding How should I use this medicine? This drug is given as an infusion or injection into a vein. It is administered in a hospital or clinic by a specially trained health care professional. Talk to your pediatrician regarding the use of this medicine in children. Special care may be needed. Overdosage: If you think you have taken too much of this medicine contact a poison control center or emergency room at once. NOTE: This medicine is only for you. Do not share this medicine with others. What if I miss a dose? It is important not to miss your dose. Call your doctor or health care professional if you are unable to keep an appointment. What may interact with this medicine? -allopurinol -cimetidine -dapsone -digoxin -hydroxyurea -leucovorin -levamisole -medicines for seizures like ethotoin, fosphenytoin, phenytoin -medicines to increase blood counts like filgrastim, pegfilgrastim, sargramostim -medicines that treat or prevent blood clots like warfarin, enoxaparin, and dalteparin -methotrexate -metronidazole -pyrimethamine -some other chemotherapy drugs like busulfan, cisplatin, estramustine, vinblastine -trimethoprim -trimetrexate -vaccines Talk to your doctor or health care professional before taking any of these medicines: -acetaminophen -aspirin -ibuprofen -ketoprofen -naproxen This list may not describe all possible interactions. Give your health care provider a list of all the medicines, herbs, non-prescription drugs, or dietary supplements you use. Also tell them if you smoke, drink alcohol, or use illegal drugs. Some items may interact with your medicine. What should I watch for while using this medicine? Visit your doctor for checks on your progress. This drug may make you feel generally unwell. This is not uncommon, as chemotherapy can affect healthy cells as well as  cancer cells.  Report any side effects. Continue your course of treatment even though you feel ill unless your doctor tells you to stop. In some cases, you may be given additional medicines to help with side effects. Follow all directions for their use. Call your doctor or health care professional for advice if you get a fever, chills or sore throat, or other symptoms of a cold or flu. Do not treat yourself. This drug decreases your body's ability to fight infections. Try to avoid being around people who are sick. This medicine may increase your risk to bruise or bleed. Call your doctor or health care professional if you notice any unusual bleeding. Be careful brushing and flossing your teeth or using a toothpick because you may get an infection or bleed more easily. If you have any dental work done, tell your dentist you are receiving this medicine. Avoid taking products that contain aspirin, acetaminophen, ibuprofen, naproxen, or ketoprofen unless instructed by your doctor. These medicines may hide a fever. Do not become pregnant while taking this medicine. Women should inform their doctor if they wish to become pregnant or think they might be pregnant. There is a potential for serious side effects to an unborn child. Talk to your health care professional or pharmacist for more information. Do not breast-feed an infant while taking this medicine. Men should inform their doctor if they wish to father a child. This medicine may lower sperm counts. Do not treat diarrhea with over the counter products. Contact your doctor if you have diarrhea that lasts more than 2 days or if it is severe and watery. This medicine can make you more sensitive to the sun. Keep out of the sun. If you cannot avoid being in the sun, wear protective clothing and use sunscreen. Do not use sun lamps or tanning beds/booths. What side effects may I notice from receiving this medicine? Side effects that you should report to your doctor  or health care professional as soon as possible: -allergic reactions like skin rash, itching or hives, swelling of the face, lips, or tongue -low blood counts - this medicine may decrease the number of white blood cells, red blood cells and platelets. You may be at increased risk for infections and bleeding. -signs of infection - fever or chills, cough, sore throat, pain or difficulty passing urine -signs of decreased platelets or bleeding - bruising, pinpoint red spots on the skin, black, tarry stools, blood in the urine -signs of decreased red blood cells - unusually weak or tired, fainting spells, lightheadedness -breathing problems -changes in vision -chest pain -mouth sores -nausea and vomiting -pain, swelling, redness at site where injected -pain, tingling, numbness in the hands or feet -redness, swelling, or sores on hands or feet -stomach pain -unusual bleeding Side effects that usually do not require medical attention (report to your doctor or health care professional if they continue or are bothersome): -changes in finger or toe nails -diarrhea -dry or itchy skin -hair loss -headache -loss of appetite -sensitivity of eyes to the light -stomach upset -unusually teary eyes This list may not describe all possible side effects. Call your doctor for medical advice about side effects. You may report side effects to FDA at 1-800-FDA-1088. Where should I keep my medicine? This drug is given in a hospital or clinic and will not be stored at home. NOTE: This sheet is a summary. It may not cover all possible information. If you have questions about this medicine, talk to your doctor, pharmacist, or health care  provider.    2016, Elsevier/Gold Standard. (2007-08-27 13:53:16)    Leucovorin injection   What is this medicine? LEUCOVORIN (loo koe VOR in) is used to prevent or treat the harmful effects of some medicines. This medicine is used to treat anemia caused by a low amount of  folic acid in the body. It is also used with 5-fluorouracil (5-FU) to treat colon cancer. This medicine may be used for other purposes; ask your health care provider or pharmacist if you have questions. What should I tell my health care provider before I take this medicine? They need to know if you have any of these conditions: -anemia from low levels of vitamin B-12 in the blood -an unusual or allergic reaction to leucovorin, folic acid, other medicines, foods, dyes, or preservatives -pregnant or trying to get pregnant -breast-feeding How should I use this medicine? This medicine is for injection into a muscle or into a vein. It is given by a health care professional in a hospital or clinic setting. Talk to your pediatrician regarding the use of this medicine in children. Special care may be needed. Overdosage: If you think you have taken too much of this medicine contact a poison control center or emergency room at once. NOTE: This medicine is only for you. Do not share this medicine with others. What if I miss a dose? This does not apply. What may interact with this medicine? -capecitabine -fluorouracil -phenobarbital -phenytoin -primidone -trimethoprim-sulfamethoxazole This list may not describe all possible interactions. Give your health care provider a list of all the medicines, herbs, non-prescription drugs, or dietary supplements you use. Also tell them if you smoke, drink alcohol, or use illegal drugs. Some items may interact with your medicine. What should I watch for while using this medicine? Your condition will be monitored carefully while you are receiving this medicine. This medicine may increase the side effects of 5-fluorouracil, 5-FU. Tell your doctor or health care professional if you have diarrhea or mouth sores that do not get better or that get worse. What side effects may I notice from receiving this medicine? Side effects that you should report to your doctor or  health care professional as soon as possible: -allergic reactions like skin rash, itching or hives, swelling of the face, lips, or tongue -breathing problems -fever, infection -mouth sores -unusual bleeding or bruising -unusually weak or tired Side effects that usually do not require medical attention (report to your doctor or health care professional if they continue or are bothersome): -constipation or diarrhea -loss of appetite -nausea, vomiting This list may not describe all possible side effects. Call your doctor for medical advice about side effects. You may report side effects to FDA at 1-800-FDA-1088. Where should I keep my medicine? This drug is given in a hospital or clinic and will not be stored at home. NOTE: This sheet is a summary. It may not cover all possible information. If you have questions about this medicine, talk to your doctor, pharmacist, or health care provider.    2016, Elsevier/Gold Standard. (2007-10-28 16:50:29)

## 2015-02-18 NOTE — Progress Notes (Signed)
Brief nutrition follow-up completed with patient and daughter during patient's first chemotherapy. Patient reports he is eating well and has increased meals to 3 times a day. Patient has not tried snacking. He has tried drinking 4 ounces of Ensure Plus at mealtimes and still complains of early satiety. No recent weight documented.  Patient reports he thinks he has gained weight.  Nutrition diagnosis: Food and nutrition related knowledge deficit continues.  Intervention:  Again educated patient on importance of small frequent meals and snacks. Recommended patient drink 4 ounces Ensure Plus as snacks between meals. Encouraged patient continue to follow post gastrectomy diet. Teach back method used. Coupons were provided.  Monitoring, evaluation, goals: Patient will continue to increase calories and protein to minimize weight loss.  Next visit: Friday, October 21 during infusion.  **Disclaimer: This note was dictated with voice recognition software. Similar sounding words can inadvertently be transcribed and this note may contain transcription errors which may not have been corrected upon publication of note.**

## 2015-02-20 ENCOUNTER — Other Ambulatory Visit: Payer: Self-pay | Admitting: Oncology

## 2015-02-22 ENCOUNTER — Telehealth: Payer: Self-pay | Admitting: *Deleted

## 2015-02-22 NOTE — Telephone Encounter (Signed)
Chemo follow up call complete. Pt denies any side effects. Pt reports improvement in appetite. Discussed management of possible diarrhea, mouth sores, nausea and hand foot syndrome. Pt understands to call the office if any of these issues arise. He understands to follow up as scheduled.

## 2015-02-24 ENCOUNTER — Other Ambulatory Visit: Payer: Self-pay | Admitting: *Deleted

## 2015-02-24 DIAGNOSIS — C166 Malignant neoplasm of greater curvature of stomach, unspecified: Secondary | ICD-10-CM

## 2015-02-25 ENCOUNTER — Other Ambulatory Visit (HOSPITAL_BASED_OUTPATIENT_CLINIC_OR_DEPARTMENT_OTHER): Payer: Managed Care, Other (non HMO)

## 2015-02-25 ENCOUNTER — Telehealth: Payer: Self-pay | Admitting: Oncology

## 2015-02-25 ENCOUNTER — Encounter: Payer: Self-pay | Admitting: Oncology

## 2015-02-25 ENCOUNTER — Encounter: Payer: Self-pay | Admitting: Physician Assistant

## 2015-02-25 ENCOUNTER — Ambulatory Visit: Payer: Managed Care, Other (non HMO) | Admitting: Nutrition

## 2015-02-25 ENCOUNTER — Ambulatory Visit (HOSPITAL_BASED_OUTPATIENT_CLINIC_OR_DEPARTMENT_OTHER): Payer: Managed Care, Other (non HMO)

## 2015-02-25 ENCOUNTER — Ambulatory Visit (HOSPITAL_BASED_OUTPATIENT_CLINIC_OR_DEPARTMENT_OTHER): Payer: Managed Care, Other (non HMO) | Admitting: Physician Assistant

## 2015-02-25 VITALS — BP 140/92 | HR 51 | Temp 98.4°F | Resp 20 | Ht 69.0 in | Wt 148.1 lb

## 2015-02-25 DIAGNOSIS — E46 Unspecified protein-calorie malnutrition: Secondary | ICD-10-CM | POA: Diagnosis not present

## 2015-02-25 DIAGNOSIS — C162 Malignant neoplasm of body of stomach: Secondary | ICD-10-CM

## 2015-02-25 DIAGNOSIS — C166 Malignant neoplasm of greater curvature of stomach, unspecified: Secondary | ICD-10-CM

## 2015-02-25 DIAGNOSIS — E119 Type 2 diabetes mellitus without complications: Secondary | ICD-10-CM

## 2015-02-25 DIAGNOSIS — Z5111 Encounter for antineoplastic chemotherapy: Secondary | ICD-10-CM

## 2015-02-25 LAB — COMPREHENSIVE METABOLIC PANEL (CC13)
ALT: 18 U/L (ref 0–55)
ANION GAP: 6 meq/L (ref 3–11)
AST: 15 U/L (ref 5–34)
Albumin: 3.7 g/dL (ref 3.5–5.0)
Alkaline Phosphatase: 60 U/L (ref 40–150)
BUN: 11.2 mg/dL (ref 7.0–26.0)
CALCIUM: 9.2 mg/dL (ref 8.4–10.4)
CHLORIDE: 112 meq/L — AB (ref 98–109)
CO2: 26 mEq/L (ref 22–29)
CREATININE: 1 mg/dL (ref 0.7–1.3)
Glucose: 105 mg/dl (ref 70–140)
POTASSIUM: 3.6 meq/L (ref 3.5–5.1)
Sodium: 143 mEq/L (ref 136–145)
Total Bilirubin: 0.45 mg/dL (ref 0.20–1.20)
Total Protein: 6.5 g/dL (ref 6.4–8.3)

## 2015-02-25 LAB — CBC WITH DIFFERENTIAL/PLATELET
BASO%: 0.9 % (ref 0.0–2.0)
BASOS ABS: 0 10*3/uL (ref 0.0–0.1)
EOS%: 3.2 % (ref 0.0–7.0)
Eosinophils Absolute: 0.1 10*3/uL (ref 0.0–0.5)
HEMATOCRIT: 36 % — AB (ref 38.4–49.9)
HGB: 11.8 g/dL — ABNORMAL LOW (ref 13.0–17.1)
LYMPH#: 2.3 10*3/uL (ref 0.9–3.3)
LYMPH%: 55.8 % — AB (ref 14.0–49.0)
MCH: 27.1 pg — AB (ref 27.2–33.4)
MCHC: 32.9 g/dL (ref 32.0–36.0)
MCV: 82.5 fL (ref 79.3–98.0)
MONO#: 0.6 10*3/uL (ref 0.1–0.9)
MONO%: 14 % (ref 0.0–14.0)
NEUT#: 1.1 10*3/uL — ABNORMAL LOW (ref 1.5–6.5)
NEUT%: 26.1 % — AB (ref 39.0–75.0)
PLATELETS: 282 10*3/uL (ref 140–400)
RBC: 4.36 10*6/uL (ref 4.20–5.82)
RDW: 14.4 % (ref 11.0–14.6)
WBC: 4.1 10*3/uL (ref 4.0–10.3)

## 2015-02-25 MED ORDER — PROCHLORPERAZINE MALEATE 10 MG PO TABS
10.0000 mg | ORAL_TABLET | Freq: Once | ORAL | Status: AC
  Administered 2015-02-25: 10 mg via ORAL

## 2015-02-25 MED ORDER — PROCHLORPERAZINE MALEATE 10 MG PO TABS
ORAL_TABLET | ORAL | Status: AC
  Filled 2015-02-25: qty 1

## 2015-02-25 MED ORDER — FLUOROURACIL CHEMO INJECTION 2.5 GM/50ML
400.0000 mg/m2 | Freq: Once | INTRAVENOUS | Status: AC
  Administered 2015-02-25: 700 mg via INTRAVENOUS
  Filled 2015-02-25: qty 14

## 2015-02-25 MED ORDER — LEUCOVORIN CALCIUM INJECTION 350 MG
400.0000 mg/m2 | Freq: Once | INTRAVENOUS | Status: AC
  Administered 2015-02-25: 712 mg via INTRAVENOUS
  Filled 2015-02-25: qty 35.6

## 2015-02-25 MED ORDER — SODIUM CHLORIDE 0.9 % IV SOLN
Freq: Once | INTRAVENOUS | Status: AC
  Administered 2015-02-25: 13:00:00 via INTRAVENOUS

## 2015-02-25 NOTE — Progress Notes (Signed)
OK to treat today w/ ANC 1.1 per Dr. Benay Spice.  RN found Leucovorin line not connected to main IV line aprox one hour after it was started.  The Leucovorin line dripped onto the floor next to the pt. Floor cleaned up and Pharmacy mixed a new bag of Leucovorin for infusion.  Dr. Benay Spice was notified and he gave order [facility] to infuse Leucovorin over 1.5 hrs instead of 2 hrs as originally ordered.    Apologized to pt for the delay in his treatment.  He verbalized understanding.

## 2015-02-25 NOTE — Progress Notes (Signed)
Faxed completed app to [facility].

## 2015-02-25 NOTE — Progress Notes (Signed)
Nutrition follow-up completed with patient and daughter at chemotherapy. Patient reports he feels well.  Has been able to walk on a daily basis. Weight documented as 148.1 pounds October 21 increased from 143.2 pounds October 7. He is drinking 4 ounces of Ensure Plus between meals and reports good tolerance. Patient denies nutrition impact symptoms Patient would like suggestions for a variety of oral nutrition supplements so he doesn't have taste fatigue.  Nutrition diagnosis: Food and nutrition related knowledge deficit improved.  Intervention: Educated patient to continue oral nutrition supplement 4 ounces between meals as tolerated. Recommended patient can try boost plus or making shakes using Ensure Plus. Also recommended patient try no added sugar Carnation breakfast essentials. Samples were provided along with recipes. Teach back method used.  Patient was appreciative of information.  Monitoring, evaluation, goals: Patient will continue to tolerate increased calories and protein to minimize weight loss.  Next visit: Friday, November 4, during infusion.  **Disclaimer: This note was dictated with voice recognition software. Similar sounding words can inadvertently be transcribed and this note may contain transcription errors which may not have been corrected upon publication of note.**

## 2015-02-25 NOTE — Progress Notes (Signed)
[facility] OFFICE PROGRESS NOTE  Patient Care Team: Zella Richer. Scotty Court, MD as PCP - General (Family Medicine)  Principle Diagnosis: 1. 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 The tumor was palpable in the body of the greater curvature. Approximately 75% of   the stomach was removed. 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 The tumor was histologic grade 3, lymphovascular invasion was not present. Perineural invasion was present. The resection margins were negative. 8 of 19 lymph nodes contained metastatic carcinoma. The tumor returned positive for HER-2 amplification by FISH.  HER-2 amplified  Staging CT scans at [facility] on 12/10/2014 with small lymph nodes along the greater curvature of the stomach, no evidence of distant metastatic disease, nonspecific small lung nodules   Prior Therapy: none  Current therapy: 5-FU/leucovorin prior to concurrent capecitabine and radiation. This will be followed by additional 5-FU/leucovorin.                               S/p D1 C1 on 1014/16  Interim History: [facility] returns to the [facility] [place] for a follow up visit prior to D8 Cycle 1 chemotherapy with 5 FU and Leucovorin.  Denies fevers, chills, night sweats, vision changes, or mucositis. Denies any respiratory complaints. Denies any chest pain or palpitations. Denies lower extremity swelling. Denies nausea, heartburn or change in bowel habits. Appetite is improved having gained 5 lbs since last visit with the help of nutritionist. Denies any dysuria. Denies abnormal skin rashes, or neuropathy. Denies any bleeding issues such as epistaxis, hematemesis, hematuria or hematochezia. Ambulating frequently without difficulty.   INTERVAL HISTORY: Please see below for problem oriented charting.  REVIEW OF SYSTEMS:    Constitutional: Denies fevers, chills or abnormal weight loss Eyes: Denies blurriness of vision Ears, nose, mouth, throat, and face: Denies mucositis or sore throat Respiratory: Denies cough, dyspnea or wheezes Cardiovascular: Denies palpitation, chest discomfort or lower extremity swelling Gastrointestinal:  Denies nausea, heartburn or change in bowel habits Skin: Denies abnormal skin rashes Lymphatics: Denies new lymphadenopathy or easy bruising Neurological:Denies numbness, tingling or new weaknesses Behavioral/Psych: Mood is stable, no new changes  All other systems were reviewed with the patient and are negative.  I have reviewed the past medical history, past surgical history, social history and family history with the patient and they are unchanged from previous note.  ALLERGIES:  has No Known Allergies.  MEDICATIONS:  Current Outpatient Prescriptions  Medication Sig Dispense Refill  . glipiZIDE (GLUCOTROL XL) 5 MG 24 hr tablet TAKE ONE TABLET BY MOUTH EVERY MORNING AT BREAKFAST FOR DIABETES  5  . HYDROmorphone (DILAUDID) 4 MG tablet Take 1 tablet (4 mg total) by mouth every 4 (four) hours as needed for severe pain. 50 tablet 0  . pantoprazole (PROTONIX) 40 MG injection Inject 40 mg into the vein at bedtime. 60 each 1   No current facility-administered medications for this visit.    PHYSICAL EXAMINATION: ECOG PERFORMANCE STATUS: 0  Filed Vitals:   02/25/15 1023  BP: 140/92  Pulse: 51  Temp: 98.4 F (36.9 C)  Resp: 20   Filed Weights   02/25/15 1023  Weight: 148 lb 1.6 oz (67.178 kg)    GENERAL:alert, no distress and comfortable SKIN: skin color, texture, turgor are normal, no rashes  or significant lesions EYES: normal, Conjunctiva are pink and non-injected, sclera clear OROPHARYNX:no exudate, no erythema and lips, buccal mucosa, and tongue normal  NECK: supple, thyroid normal size, non-tender, without nodularity LYMPH:  no palpable lymphadenopathy in the  cervical, axillary or inguinal LUNGS: clear to auscultation and percussion with normal breathing effort HEART: regular rate & rhythm and no murmurs and no lower extremity edema ABDOMEN:abdomen soft, non-tender and normal bowel sounds. Well healed abdominal scar Musculoskeletal:no cyanosis of digits and no clubbing  NEURO: alert & oriented x 3 with fluent speech, no focal motor/sensory deficits  LABORATORY DATA:  I have reviewed the data as listed    Component Value Date/Time   NA 141 02/18/2015 0852   NA 135 01/30/2015 0526   K 4.0 02/18/2015 0852   K 4.0 01/30/2015 0526   CL 101 01/30/2015 0526   CO2 23 02/18/2015 0852   CO2 27 01/30/2015 0526   GLUCOSE 103 02/18/2015 0852   GLUCOSE 180* 01/30/2015 0526   BUN 11.4 02/18/2015 0852   BUN 9 01/30/2015 0526   CREATININE 1.0 02/18/2015 0852   CREATININE 1.07 01/30/2015 0526   CALCIUM 9.0 02/18/2015 0852   CALCIUM 8.7* 01/30/2015 0526   PROT 7.1 02/18/2015 0852   PROT 7.0 01/27/2015 1510   ALBUMIN 3.8 02/18/2015 0852   ALBUMIN 4.1 01/27/2015 1510   AST 18 02/18/2015 0852   AST 22 01/27/2015 1510   ALT 22 02/18/2015 0852   ALT 22 01/27/2015 1510   ALKPHOS 74 02/18/2015 0852   ALKPHOS 68 01/27/2015 1510   BILITOT 0.69 02/18/2015 0852   BILITOT 0.2* 01/27/2015 1510   GFRNONAA >60 01/30/2015 0526   GFRAA >60 01/30/2015 0526    No results found for: SPEP, UPEP  Lab Results  Component Value Date   WBC 4.1 02/25/2015   NEUTROABS 1.1* 02/25/2015   HGB 11.8* 02/25/2015   HCT 36.0* 02/25/2015   MCV 82.5 02/25/2015   PLT 282 02/25/2015      Chemistry      Component Value Date/Time   NA 141 02/18/2015 0852   NA 135 01/30/2015 0526   K 4.0 02/18/2015 0852   K 4.0 01/30/2015 0526   CL 101 01/30/2015 0526   CO2 23 02/18/2015 0852   CO2 27 01/30/2015 0526   BUN 11.4 02/18/2015 0852   BUN 9 01/30/2015 0526   CREATININE 1.0 02/18/2015 0852   CREATININE 1.07 01/30/2015 0526      Component Value Date/Time   CALCIUM 9.0  02/18/2015 0852   CALCIUM 8.7* 01/30/2015 0526   ALKPHOS 74 02/18/2015 0852   ALKPHOS 68 01/27/2015 1510   AST 18 02/18/2015 0852   AST 22 01/27/2015 1510   ALT 22 02/18/2015 0852   ALT 22 01/27/2015 1510   BILITOT 0.69 02/18/2015 0852   BILITOT 0.2* 01/27/2015 1510       RADIOGRAPHIC STUDIES: I have personally reviewed the radiological images as listed and agreed with the findings in the report. No results found.   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 This will be followed by additional 5-FU / LV          He  is S/p D1 C1 on 1014/16         He wil receive C1 D8 today         He will return on 10/28 for C1 D15 as scheduled, to be seen in 2 weeks by Dr. Benay Spice.    Chemotherapy induced protein calorie malnutrition Improved. He has gained 5 lbs since last visit He is to follow with nutritionist as scheduled   Diabetes Mellitus Monitored by his PCP         All questions were answered.  Case was reviewed and discussed with Dr. Benay Spice. The patient knows to call the clinic with any problems, questions or concerns. No barriers to learning was detected.     Rondel Jumbo, PA-C 02/25/2015 10:37 AM

## 2015-02-25 NOTE — Progress Notes (Signed)
Met with patient and daughter to introduce myself as Estate manager/land agent. Gave my card and applications for [facility] and [facility] due to him living in [place]. Pt knows to contact me with any financial questions or concerns.Discussed [place] as well. Pt will get me income verification and return to me to complete application.

## 2015-02-25 NOTE — Patient Instructions (Signed)
[facility] Discharge Instructions for Patients Receiving Chemotherapy  Today you received the following chemotherapy agents;  Fluorouracil and Leucovorin.   To help prevent nausea and vomiting after your treatment, we encourage you to take your nausea medication as directed.    If you develop nausea and vomiting that is not controlled by your nausea medication, call the clinic.   BELOW ARE SYMPTOMS THAT SHOULD BE REPORTED IMMEDIATELY:  *FEVER GREATER THAN 100.5 F  *CHILLS WITH OR WITHOUT FEVER  NAUSEA AND VOMITING THAT IS NOT CONTROLLED WITH YOUR NAUSEA MEDICATION  *UNUSUAL SHORTNESS OF BREATH  *UNUSUAL BRUISING OR BLEEDING  TENDERNESS IN MOUTH AND THROAT WITH OR WITHOUT PRESENCE OF ULCERS  *URINARY PROBLEMS  *BOWEL PROBLEMS  UNUSUAL RASH Items with * indicate a potential emergency and should be followed up as soon as possible.  Feel free to call the clinic you have any questions or concerns. The clinic phone number is [phone].  Please show the [facility] at check-in to the Emergency Department and triage nurse.

## 2015-02-25 NOTE — Telephone Encounter (Signed)
Gave and printed appt sched and avs fo rpt for OCT and NOV  °

## 2015-02-27 ENCOUNTER — Other Ambulatory Visit: Payer: Self-pay | Admitting: Oncology

## 2015-02-27 DIAGNOSIS — C166 Malignant neoplasm of greater curvature of stomach, unspecified: Secondary | ICD-10-CM

## 2015-03-02 ENCOUNTER — Telehealth: Payer: Self-pay | Admitting: *Deleted

## 2015-03-02 ENCOUNTER — Telehealth: Payer: Self-pay | Admitting: Oncology

## 2015-03-02 NOTE — Telephone Encounter (Signed)
Patient's daughter called and left message regarding appts. I have called her back and moved appts per patient request. Daughter also stated that the patient stated "concerns reagrding my last treatment and being billed." I told the daughter I would have our manager was not in the office today but  Would try to  call her tomorrow afternoon. Message left for manager.

## 2015-03-02 NOTE — Telephone Encounter (Signed)
Dtr called to change time of 10/28 lab/inf appointments. Dtr given new time for 10:15 am. Dtr forwarded to desk nurse re concerns about last week's tx.

## 2015-03-02 NOTE — Telephone Encounter (Signed)
Open by mistake

## 2015-03-04 ENCOUNTER — Other Ambulatory Visit (HOSPITAL_BASED_OUTPATIENT_CLINIC_OR_DEPARTMENT_OTHER): Payer: Managed Care, Other (non HMO)

## 2015-03-04 ENCOUNTER — Ambulatory Visit (HOSPITAL_BASED_OUTPATIENT_CLINIC_OR_DEPARTMENT_OTHER): Payer: Managed Care, Other (non HMO)

## 2015-03-04 VITALS — BP 153/74 | HR 54 | Temp 98.5°F | Resp 20 | Wt 147.5 lb

## 2015-03-04 DIAGNOSIS — C166 Malignant neoplasm of greater curvature of stomach, unspecified: Secondary | ICD-10-CM

## 2015-03-04 DIAGNOSIS — Z5111 Encounter for antineoplastic chemotherapy: Secondary | ICD-10-CM | POA: Diagnosis not present

## 2015-03-04 DIAGNOSIS — C162 Malignant neoplasm of body of stomach: Secondary | ICD-10-CM

## 2015-03-04 LAB — CBC WITH DIFFERENTIAL/PLATELET
BASO%: 0.9 % (ref 0.0–2.0)
BASOS ABS: 0 10*3/uL (ref 0.0–0.1)
EOS%: 2.7 % (ref 0.0–7.0)
Eosinophils Absolute: 0.1 10*3/uL (ref 0.0–0.5)
HCT: 36.5 % — ABNORMAL LOW (ref 38.4–49.9)
HGB: 12 g/dL — ABNORMAL LOW (ref 13.0–17.1)
LYMPH%: 47.4 % (ref 14.0–49.0)
MCH: 27.2 pg (ref 27.2–33.4)
MCHC: 32.9 g/dL (ref 32.0–36.0)
MCV: 82.7 fL (ref 79.3–98.0)
MONO#: 0.6 10*3/uL (ref 0.1–0.9)
MONO%: 14.7 % — AB (ref 0.0–14.0)
NEUT#: 1.5 10*3/uL (ref 1.5–6.5)
NEUT%: 34.3 % — AB (ref 39.0–75.0)
Platelets: 226 10*3/uL (ref 140–400)
RBC: 4.41 10*6/uL (ref 4.20–5.82)
RDW: 14.8 % — ABNORMAL HIGH (ref 11.0–14.6)
WBC: 4.3 10*3/uL (ref 4.0–10.3)
lymph#: 2 10*3/uL (ref 0.9–3.3)

## 2015-03-04 MED ORDER — FLUOROURACIL CHEMO INJECTION 2.5 GM/50ML
400.0000 mg/m2 | Freq: Once | INTRAVENOUS | Status: AC
  Administered 2015-03-04: 700 mg via INTRAVENOUS
  Filled 2015-03-04: qty 14

## 2015-03-04 MED ORDER — SODIUM CHLORIDE 0.9 % IV SOLN
Freq: Once | INTRAVENOUS | Status: AC
  Administered 2015-03-04: 11:00:00 via INTRAVENOUS

## 2015-03-04 MED ORDER — SODIUM CHLORIDE 0.9 % IV SOLN
400.0000 mg/m2 | Freq: Once | INTRAVENOUS | Status: AC
  Administered 2015-03-04: 712 mg via INTRAVENOUS
  Filled 2015-03-04: qty 35.6

## 2015-03-04 MED ORDER — PROCHLORPERAZINE MALEATE 10 MG PO TABS
ORAL_TABLET | ORAL | Status: AC
  Filled 2015-03-04: qty 1

## 2015-03-04 MED ORDER — LEUCOVORIN CALCIUM INJECTION 350 MG: 400.0000 mg/m2 | Freq: Once | INTRAVENOUS | Status: DC

## 2015-03-04 MED ORDER — PROCHLORPERAZINE MALEATE 10 MG PO TABS
10.0000 mg | ORAL_TABLET | Freq: Once | ORAL | Status: AC
  Administered 2015-03-04: 10 mg via ORAL

## 2015-03-04 NOTE — Patient Instructions (Signed)
[facility] Discharge Instructions for Patients Receiving Chemotherapy  Today you received the following chemotherapy agents: Luecovorin and Adrucil   To help prevent nausea and vomiting after your treatment, we encourage you to take your nausea medication as directed.    If you develop nausea and vomiting that is not controlled by your nausea medication, call the clinic.   BELOW ARE SYMPTOMS THAT SHOULD BE REPORTED IMMEDIATELY:  *FEVER GREATER THAN 100.5 F  *CHILLS WITH OR WITHOUT FEVER  NAUSEA AND VOMITING THAT IS NOT CONTROLLED WITH YOUR NAUSEA MEDICATION  *UNUSUAL SHORTNESS OF BREATH  *UNUSUAL BRUISING OR BLEEDING  TENDERNESS IN MOUTH AND THROAT WITH OR WITHOUT PRESENCE OF ULCERS  *URINARY PROBLEMS  *BOWEL PROBLEMS  UNUSUAL RASH Items with * indicate a potential emergency and should be followed up as soon as possible.  Feel free to call the clinic you have any questions or concerns. The clinic phone number is [phone].  Please show the [place] at check-in to the Emergency Department and triage nurse.

## 2015-03-04 NOTE — Progress Notes (Signed)
Okay to proceed with Cmet from 02/25/15 from 02/25/15 per pharmacy.

## 2015-03-06 ENCOUNTER — Other Ambulatory Visit: Payer: Self-pay | Admitting: Oncology

## 2015-03-11 ENCOUNTER — Other Ambulatory Visit: Payer: Self-pay | Admitting: *Deleted

## 2015-03-11 ENCOUNTER — Ambulatory Visit (HOSPITAL_BASED_OUTPATIENT_CLINIC_OR_DEPARTMENT_OTHER): Payer: Managed Care, Other (non HMO)

## 2015-03-11 ENCOUNTER — Telehealth: Payer: Self-pay | Admitting: Physician Assistant

## 2015-03-11 ENCOUNTER — Encounter: Payer: Managed Care, Other (non HMO) | Admitting: Nutrition

## 2015-03-11 ENCOUNTER — Ambulatory Visit (HOSPITAL_BASED_OUTPATIENT_CLINIC_OR_DEPARTMENT_OTHER): Payer: Managed Care, Other (non HMO) | Admitting: Physician Assistant

## 2015-03-11 ENCOUNTER — Encounter: Payer: Self-pay | Admitting: Physician Assistant

## 2015-03-11 ENCOUNTER — Other Ambulatory Visit (HOSPITAL_BASED_OUTPATIENT_CLINIC_OR_DEPARTMENT_OTHER): Payer: Managed Care, Other (non HMO)

## 2015-03-11 VITALS — BP 161/89 | HR 47 | Temp 97.8°F | Resp 18 | Ht 69.0 in | Wt 145.4 lb

## 2015-03-11 DIAGNOSIS — C166 Malignant neoplasm of greater curvature of stomach, unspecified: Secondary | ICD-10-CM

## 2015-03-11 DIAGNOSIS — C162 Malignant neoplasm of body of stomach: Secondary | ICD-10-CM

## 2015-03-11 DIAGNOSIS — E119 Type 2 diabetes mellitus without complications: Secondary | ICD-10-CM

## 2015-03-11 DIAGNOSIS — C169 Malignant neoplasm of stomach, unspecified: Secondary | ICD-10-CM

## 2015-03-11 DIAGNOSIS — Z5111 Encounter for antineoplastic chemotherapy: Secondary | ICD-10-CM | POA: Diagnosis not present

## 2015-03-11 DIAGNOSIS — E46 Unspecified protein-calorie malnutrition: Secondary | ICD-10-CM

## 2015-03-11 LAB — CBC WITH DIFFERENTIAL/PLATELET
BASO%: 1 % (ref 0.0–2.0)
BASOS ABS: 0 10*3/uL (ref 0.0–0.1)
EOS ABS: 0.1 10*3/uL (ref 0.0–0.5)
EOS%: 2.6 % (ref 0.0–7.0)
HEMATOCRIT: 37.3 % — AB (ref 38.4–49.9)
HEMOGLOBIN: 12.4 g/dL — AB (ref 13.0–17.1)
LYMPH%: 51.7 % — ABNORMAL HIGH (ref 14.0–49.0)
MCH: 27.7 pg (ref 27.2–33.4)
MCHC: 33.2 g/dL (ref 32.0–36.0)
MCV: 83.5 fL (ref 79.3–98.0)
MONO#: 0.6 10*3/uL (ref 0.1–0.9)
MONO%: 12.9 % (ref 0.0–14.0)
NEUT#: 1.4 10*3/uL — ABNORMAL LOW (ref 1.5–6.5)
NEUT%: 31.8 % — ABNORMAL LOW (ref 39.0–75.0)
Platelets: 201 10*3/uL (ref 140–400)
RBC: 4.47 10*6/uL (ref 4.20–5.82)
RDW: 16.1 % — AB (ref 11.0–14.6)
WBC: 4.4 10*3/uL (ref 4.0–10.3)
lymph#: 2.3 10*3/uL (ref 0.9–3.3)

## 2015-03-11 LAB — COMPREHENSIVE METABOLIC PANEL (CC13)
ALBUMIN: 4.2 g/dL (ref 3.5–5.0)
ALK PHOS: 70 U/L (ref 40–150)
ALT: 22 U/L (ref 0–55)
AST: 18 U/L (ref 5–34)
Anion Gap: 9 mEq/L (ref 3–11)
BUN: 8.5 mg/dL (ref 7.0–26.0)
CALCIUM: 9.4 mg/dL (ref 8.4–10.4)
CO2: 25 mEq/L (ref 22–29)
Chloride: 109 mEq/L (ref 98–109)
Creatinine: 1 mg/dL (ref 0.7–1.3)
Glucose: 86 mg/dl (ref 70–140)
POTASSIUM: 3.7 meq/L (ref 3.5–5.1)
SODIUM: 143 meq/L (ref 136–145)
Total Bilirubin: 0.58 mg/dL (ref 0.20–1.20)
Total Protein: 7 g/dL (ref 6.4–8.3)

## 2015-03-11 MED ORDER — FLUOROURACIL CHEMO INJECTION 2.5 GM/50ML
400.0000 mg/m2 | Freq: Once | INTRAVENOUS | Status: AC
  Administered 2015-03-11: 700 mg via INTRAVENOUS
  Filled 2015-03-11: qty 14

## 2015-03-11 MED ORDER — PROCHLORPERAZINE MALEATE 10 MG PO TABS
ORAL_TABLET | ORAL | Status: AC
  Filled 2015-03-11: qty 1

## 2015-03-11 MED ORDER — SODIUM CHLORIDE 0.9 % IV SOLN
Freq: Once | INTRAVENOUS | Status: AC
  Administered 2015-03-11: 13:00:00 via INTRAVENOUS

## 2015-03-11 MED ORDER — PROCHLORPERAZINE MALEATE 10 MG PO TABS
10.0000 mg | ORAL_TABLET | Freq: Once | ORAL | Status: AC
  Administered 2015-03-11: 10 mg via ORAL

## 2015-03-11 MED ORDER — SODIUM CHLORIDE 0.9 % IV SOLN
400.0000 mg/m2 | Freq: Once | INTRAVENOUS | Status: AC
  Administered 2015-03-11: 712 mg via INTRAVENOUS
  Filled 2015-03-11: qty 35.6

## 2015-03-11 NOTE — Patient Instructions (Signed)
[facility] Discharge Instructions for Patients Receiving Chemotherapy  Today you received the following chemotherapy agents: Leucovorin and Adrucil  To help prevent nausea and vomiting after your treatment, we encourage you to take your nausea medication as directed.    If you develop nausea and vomiting that is not controlled by your nausea medication, call the clinic.   BELOW ARE SYMPTOMS THAT SHOULD BE REPORTED IMMEDIATELY:  *FEVER GREATER THAN 100.5 F  *CHILLS WITH OR WITHOUT FEVER  NAUSEA AND VOMITING THAT IS NOT CONTROLLED WITH YOUR NAUSEA MEDICATION  *UNUSUAL SHORTNESS OF BREATH  *UNUSUAL BRUISING OR BLEEDING  TENDERNESS IN MOUTH AND THROAT WITH OR WITHOUT PRESENCE OF ULCERS  *URINARY PROBLEMS  *BOWEL PROBLEMS  UNUSUAL RASH Items with * indicate a potential emergency and should be followed up as soon as possible.  Feel free to call the clinic you have any questions or concerns. The clinic phone number is [phone].  Please show the CHEMO ALERT CARD at check-in to the Emergency Department and triage nurse.   

## 2015-03-11 NOTE — Telephone Encounter (Signed)
gave pt copy of sch-appts already sch

## 2015-03-11 NOTE — Progress Notes (Signed)
[facility] OFFICE PROGRESS NOTE  Patient Care Team: Zella Richer. Scotty Court, MD as PCP - General (Family Medicine)  Principle Diagnosis: 1. 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 The tumor was palpable in the body of the greater curvature. Approximately 75% of   the stomach was removed. 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 The tumor was histologic grade 3, lymphovascular invasion was not present. Perineural invasion was present. The resection margins were negative. 8 of 19 lymph nodes contained metastatic carcinoma. The tumor returned positive for HER-2 amplification by FISH.  HER-2 amplified  Staging CT scans at [facility] on 12/10/2014 with small lymph nodes along the greater curvature of the stomach, no evidence of distant metastatic disease, nonspecific small lung nodules   Prior Therapy: none  Current therapy: 5-FU/leucovorin prior to concurrent capecitabine and radiation. This will be followed by additional 5-FU/leucovorin.                               S/p D1 C1 on 1014/16  Interim History: Troy Barrera returns to the [facility] for a follow up visit prior to D22 Cycle 1 chemotherapy with 5 FU and Leucovorin. He is feeling well. Denies fevers, chills, night sweats, vision changes, or mucositis. Denies any respiratory complaints. Denies any chest pain or palpitations. Denies lower extremity swelling. Denies nausea, heartburn or change in bowel habits. Appetite is improved with the help of nutritionist. Denies any dysuria. Denies abnormal skin rashes, or neuropathy. Denies any bleeding issues such as epistaxis, hematemesis, hematuria or hematochezia. Ambulating frequently without difficulty.   INTERVAL HISTORY: Please see below for problem oriented charting.  REVIEW OF SYSTEMS:   Constitutional:  Denies fevers, chills or abnormal weight loss Eyes: Denies blurriness of vision Ears, nose, mouth, throat, and face: Denies mucositis or sore throat Respiratory: Denies cough, dyspnea or wheezes Cardiovascular: Denies palpitation, chest discomfort or lower extremity swelling Gastrointestinal:  Denies nausea, heartburn or change in bowel habits Skin: Denies abnormal skin rashes Lymphatics: Denies new lymphadenopathy or easy bruising Neurological:Denies numbness, tingling or new weaknesses Behavioral/Psych: Mood is stable, no new changes  All other systems were reviewed with the patient and are negative.  I have reviewed the past medical history, past surgical history, social history and family history with the patient and they are unchanged from previous note.  ALLERGIES:  has No Known Allergies.  MEDICATIONS:  Current Outpatient Prescriptions  Medication Sig Dispense Refill  . glipiZIDE (GLUCOTROL XL) 5 MG 24 hr tablet TAKE ONE TABLET BY MOUTH EVERY MORNING AT BREAKFAST FOR DIABETES  5  . HYDROmorphone (DILAUDID) 4 MG tablet Take 1 tablet (4 mg total) by mouth every 4 (four) hours as needed for severe pain. 50 tablet 0   No current facility-administered medications for this visit.    PHYSICAL EXAMINATION: ECOG PERFORMANCE STATUS: 0  Filed Vitals:   03/11/15 1047  BP: 161/89  Pulse: 47  Temp: 97.8 F (36.6 C)  Resp: 18   Filed Weights   03/11/15 1047  Weight: 145 lb 6.4 oz (65.953 kg)    GENERAL:alert, no distress and comfortable SKIN: skin color, texture, turgor are normal, no rashes or significant lesions EYES: normal, Conjunctiva are pink and non-injected, sclera clear OROPHARYNX:no exudate, no erythema and lips, buccal mucosa, and tongue  normal  NECK: supple, thyroid normal size, non-tender, without nodularity LYMPH:  no palpable lymphadenopathy in the cervical, axillary or inguinal LUNGS: clear to auscultation and percussion with normal breathing effort HEART:  regular rate & rhythm and no murmurs and no lower extremity edema ABDOMEN:abdomen soft, non-tender and normal bowel sounds. Well healed abdominal scar Musculoskeletal:no cyanosis of digits and no clubbing  NEURO: alert & oriented x 3 with fluent speech, no focal motor/sensory deficits  LABORATORY DATA:   No results found for: SPEP, UPEP  CBC Latest Ref Rng 03/11/2015 03/04/2015 02/25/2015  WBC 4.0 - 10.3 10e3/uL 4.4 4.3 4.1  Hemoglobin 13.0 - 17.1 g/dL 12.4(L) 12.0(L) 11.8(L)  Hematocrit 38.4 - 49.9 % 37.3(L) 36.5(L) 36.0(L)  Platelets 140 - 400 10e3/uL 201 226 282   CMP Latest Ref Rng 02/25/2015 02/18/2015 01/30/2015  Glucose 70 - 140 mg/dl 105 103 180(H)  BUN 7.0 - 26.0 mg/dL 11.2 11.4 9  Creatinine 0.7 - 1.3 mg/dL 1.0 1.0 1.07  Sodium 136 - 145 mEq/L 143 141 135  Potassium 3.5 - 5.1 mEq/L 3.6 4.0 4.0  Chloride 101 - 111 mmol/L - - 101  CO2 22 - 29 mEq/L _0 Calcium 8.4 - 10.4 mg/dL 9.2 9.0 8.7(L)  Total Protein 6.4 - 8.3 g/dL 6.5 7.1 -  Total Bilirubin 0.20 - 1.20 mg/dL 0.45 0.69 -  Alkaline Phos 40 - 150 U/L 60 74 -  AST 5 - 34 U/L 15 18 -  ALT 0 - 55 U/L 18 22 -     RADIOGRAPHIC STUDIES: No results found.   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 This will be followed by additional 5-FU / LV          He is S/p D1 C1 on 1014/16         He wil receive C1 D22 today         He is scheduled for further labs and treatment on days 29 and 36 as scheduled         He will return on 11/18 to be seen  by Dr. Benay Spice.    Chemotherapy induced protein calorie malnutrition Improved. He has gained 5 lbs since last visit He  is to follow with nutritionist as scheduled   Diabetes Mellitus Monitored by his PCP         All questions were answered.The patient knows to call the clinic with any problems, questions or concerns. No barriers to learning was detected.     [place], PA-C 03/11/2015 11:12 AM

## 2015-03-11 NOTE — Progress Notes (Signed)
Okay to treat with ANC of 1.4 per Sharene Butters NP.  Line flushed with NS And Blood return noted before and after Adrucil push.

## 2015-03-11 NOTE — Progress Notes (Signed)
Today is last treatment day of 5FU/LV, then needs Xeloda and RT. POF to scheduler and Epic email to Dr. Ida Rogue scheduler.

## 2015-03-15 ENCOUNTER — Encounter: Payer: Self-pay | Admitting: Radiation Oncology

## 2015-03-15 NOTE — Progress Notes (Signed)
GI Location of Tumor / Histology: Gastric Cancer stage IIIA   Troy Barrera presented  months ago with symptoms of: Dyspepsia and weight loss and upper abdominal pain nausea and vomiting 2 month hx  Biopsies of  (if applicable) revealed:Diagnosis 01/28/15  Stomach, resection for tumor, partial gastrectomySIGNET RING CELL CARCINOMA (4.0 CM, 0.8 CM IN DEPTH)THE TUMOR INVADES MUSCULARIS PROPRIA PERINEURAL INVASION IS PRESENTNO DEFINITIVE LYMPHOVASCULAR INVASION IDENTIFIEDMARGINS OF RESECTION ARE NEGATIVE FOR TUMOR METASTATIC CARCINOMA IDENTIFIED IN EIGHT OF NINETEEN LYMPH NODES (8/19) HER 2 Positive  Past/Anticipated interventions by surgeon, if any: 01/28/15 Subtotal gastrectomy  ( 75 % stomach removed)  By Dr. Elwin Mocha, follow up on 02/23/15  Past/Anticipated interventions by medical oncology, if any: Dr. Benay Spice, Current therapy: 5-FU/leucovorin prior to concurrent capecitabine and radiation. This will be followed by additional 5-FU/leucovorin. S/p D1 C1 on 1014/16,He wil receive C1 D22 today  He is scheduled for further labs and treatment on days 29 and 36 as scheduled  He will return on 11/18 to be seen by Dr. Benay Spice Weight changes, if any: gained 5 lbs  Since last visit ,follow with nutritionist as scheduled on 03/18/15    Bowel/Bladder complaints, if any:   Nausea / Vomiting, if any: NO  Pain issues, if any:   Any blood per rectum: NO  SAFETY ISSUES:   Prior radiation? NO  Pacemaker/ICD?  No  Possible current pregnancy? N/A  Is the patient on methotrexate?  NO  Details, C/O: single, works cigarette factory quit smoking 3 months ago,drinks beer on weekends, remotely quit drug use,  no family hx cancer, Father heart disease, Mother HTN,  Allergies:NKA

## 2015-03-16 ENCOUNTER — Ambulatory Visit
Admission: RE | Admit: 2015-03-16 | Discharge: 2015-03-16 | Disposition: A | Discharge status: Home / Self Care | Payer: Managed Care, Other (non HMO) | Source: Ambulatory Visit | Attending: Radiation Oncology | Admitting: Radiation Oncology

## 2015-03-16 ENCOUNTER — Encounter: Payer: Self-pay | Admitting: Radiation Oncology

## 2015-03-16 VITALS — BP 178/87 | HR 45 | Temp 98.3°F | Ht 69.0 in | Wt 145.5 lb

## 2015-03-16 DIAGNOSIS — C166 Malignant neoplasm of greater curvature of stomach, unspecified: Secondary | ICD-10-CM | POA: Insufficient documentation

## 2015-03-16 DIAGNOSIS — Z510 Encounter for antineoplastic radiation therapy: Secondary | ICD-10-CM | POA: Diagnosis present

## 2015-03-16 HISTORY — DX: Malignant neoplasm of stomach, unspecified: C16.9

## 2015-03-16 MED ORDER — ONDANSETRON HCL 8 MG PO TABS: 8.0000 mg | ORAL_TABLET | Freq: Three times a day (TID) | ORAL | Status: DC | PRN

## 2015-03-18 ENCOUNTER — Encounter: Payer: Self-pay | Admitting: *Deleted

## 2015-03-18 ENCOUNTER — Encounter: Payer: Managed Care, Other (non HMO) | Admitting: Nutrition

## 2015-03-18 ENCOUNTER — Telehealth: Payer: Self-pay | Admitting: Oncology

## 2015-03-18 ENCOUNTER — Ambulatory Visit: Payer: Managed Care, Other (non HMO)

## 2015-03-18 ENCOUNTER — Other Ambulatory Visit: Payer: Managed Care, Other (non HMO)

## 2015-03-18 NOTE — Progress Notes (Signed)
Still on schedule for IV chemotherapy this week--cancelled per Dr. Benay Spice request. Needs to start RT/Xeloda on 11/14 (SIM completed on 11/9). Epic message to Dr. Lisbeth Renshaw to inquire if he will be ready to start RT on 11/14 or when he will be able to start per Dr. Benay Spice request (Dr. Benay Spice sending Xeloda script today). Will have nutrition appointment moved to week of 11/14. Scheduled to see Ned Card, NP on 11/14 to go over this portion of his treatment plan. POF to scheduler to call him with changes.

## 2015-03-18 NOTE — Progress Notes (Signed)
Spoke w/Karen, scheduler with radiation oncology-she has RT starting on 11/16 at 1:20 pm (not showing on EPIC schedule). Sire presented to cancer center for treatment today and spoke with him regarding need to start oral Xeloda w/his RT next week. Collaborative nurse was trying to call him with schedule change without success. Moved his visit with APP for 11/16 after his radiation. Explained to him and daughter that Xeloda script will be sent to [facility] today to determine co pay. Will review treatment in detail on Wednesday. Apologized for the scheduling error.

## 2015-03-18 NOTE — Telephone Encounter (Signed)
PER SUSAN LAB/FU MOVED TO Wednesday 11/16. PATIENT GIVEN NEW SCHEDULE. NUTRITION APPOINTMENT CXD FOR NOW AND WILL BE RESCHEDULED AT A LATER DATE. PER SUSAN PATIENT HAS XRT 11/16 @ 1:20 PM - NOT VISIBLE IN EPIC - PATIENT WILL GO TO LAB - XRT - THEN LT.

## 2015-03-21 ENCOUNTER — Ambulatory Visit: Payer: Managed Care, Other (non HMO) | Admitting: Nurse Practitioner

## 2015-03-22 ENCOUNTER — Other Ambulatory Visit: Payer: Self-pay | Admitting: *Deleted

## 2015-03-22 ENCOUNTER — Telehealth: Payer: Self-pay

## 2015-03-22 ENCOUNTER — Telehealth: Payer: Self-pay | Admitting: Oncology

## 2015-03-22 DIAGNOSIS — Z510 Encounter for antineoplastic radiation therapy: Secondary | ICD-10-CM | POA: Diagnosis not present

## 2015-03-22 NOTE — Telephone Encounter (Signed)
Per 11/15 pof cxd 11/16 lab. Spoke with desk nurse and patient to keep radonc/LT. Spoke with dtr she is aware.

## 2015-03-22 NOTE — Telephone Encounter (Signed)
Daughter called that pt is looking for his oral chemo Rx and his zofran Rx. Zofran has been sent to [facility] outpatient pharmacy. Merceda Elks wrote pt is to get Xeloda. This is not on medication list. Was it prescribed? Daughter also asking that verbal instructions be called to the patient besides on the pill bottle.

## 2015-03-22 NOTE — Telephone Encounter (Signed)
Notified pt's daughter Jewart that MD is currently reviewing dosage of Xeloda that pt will need and will be sending that script to [facility] and then it will go through authorization process.  [facility] pharmacy will notify pt when med is available.  Danielle verbalized understanding and expressed appreciation for call back.

## 2015-03-23 ENCOUNTER — Other Ambulatory Visit: Payer: Managed Care, Other (non HMO)

## 2015-03-23 ENCOUNTER — Other Ambulatory Visit: Payer: Self-pay | Admitting: *Deleted

## 2015-03-23 ENCOUNTER — Encounter: Payer: Self-pay | Admitting: Pharmacist

## 2015-03-23 ENCOUNTER — Encounter: Payer: Managed Care, Other (non HMO) | Admitting: Nutrition

## 2015-03-23 ENCOUNTER — Other Ambulatory Visit: Payer: Self-pay | Admitting: Oncology

## 2015-03-23 ENCOUNTER — Telehealth: Payer: Self-pay | Admitting: *Deleted

## 2015-03-23 ENCOUNTER — Ambulatory Visit: Payer: Managed Care, Other (non HMO) | Admitting: Nurse Practitioner

## 2015-03-23 DIAGNOSIS — Z5111 Encounter for antineoplastic chemotherapy: Secondary | ICD-10-CM

## 2015-03-23 DIAGNOSIS — Z510 Encounter for antineoplastic radiation therapy: Secondary | ICD-10-CM | POA: Diagnosis not present

## 2015-03-23 DIAGNOSIS — Z7189 Other specified counseling: Secondary | ICD-10-CM | POA: Insufficient documentation

## 2015-03-23 MED ORDER — CAPECITABINE 500 MG PO TABS: 625.0000 mg/m2 | ORAL_TABLET | Freq: Two times a day (BID) | ORAL | Status: DC

## 2015-03-23 NOTE — Progress Notes (Signed)
11/16 - Rx for Xeloda sent to [facility]. Rx processed with $0 copay.

## 2015-03-23 NOTE — Telephone Encounter (Signed)
  Oncology Nurse Navigator Documentation    Navigator Encounter Type: Telephone (03/23/15 1300): Confirmed with Troy Barrera that his co pay for Xeloda is zero.

## 2015-03-23 NOTE — Progress Notes (Signed)
Confirmed with patient and with nurse, Thayer Headings in radiation oncology that he did come today. His treatments this week are at 1:20 pm. POF to scheduler to have him see Ned Card on 11/18 at 2:15 pm.

## 2015-03-24 ENCOUNTER — Telehealth: Payer: Self-pay | Admitting: Oncology

## 2015-03-24 DIAGNOSIS — Z510 Encounter for antineoplastic radiation therapy: Secondary | ICD-10-CM | POA: Diagnosis not present

## 2015-03-24 NOTE — Progress Notes (Signed)
Not seen

## 2015-03-24 NOTE — Telephone Encounter (Signed)
s.w. pt dtr and confirmed all appts.....sched can not see radonc appts transferred to radonnc to confirm appts...Marland KitchenMarland Kitchen

## 2015-03-25 ENCOUNTER — Ambulatory Visit: Payer: Managed Care, Other (non HMO) | Admitting: Oncology

## 2015-03-25 ENCOUNTER — Ambulatory Visit: Payer: Managed Care, Other (non HMO)

## 2015-03-25 ENCOUNTER — Encounter: Payer: Self-pay | Admitting: Nurse Practitioner

## 2015-03-25 ENCOUNTER — Telehealth: Payer: Self-pay | Admitting: Oncology

## 2015-03-25 ENCOUNTER — Encounter: Payer: Self-pay | Admitting: Radiation Oncology

## 2015-03-25 ENCOUNTER — Ambulatory Visit (HOSPITAL_BASED_OUTPATIENT_CLINIC_OR_DEPARTMENT_OTHER): Payer: Managed Care, Other (non HMO) | Admitting: Nurse Practitioner

## 2015-03-25 ENCOUNTER — Other Ambulatory Visit: Payer: Managed Care, Other (non HMO)

## 2015-03-25 ENCOUNTER — Ambulatory Visit
Admission: RE | Admit: 2015-03-25 | Discharge: 2015-03-25 | Disposition: A | Discharge status: Home / Self Care | Payer: Managed Care, Other (non HMO) | Source: Ambulatory Visit | Attending: Radiation Oncology | Admitting: Radiation Oncology

## 2015-03-25 VITALS — BP 130/75 | HR 52 | Temp 98.6°F | Resp 18 | Ht 69.0 in | Wt 145.6 lb

## 2015-03-25 VITALS — BP 156/89 | HR 49 | Temp 97.3°F | Resp 20 | Wt 146.0 lb

## 2015-03-25 DIAGNOSIS — C166 Malignant neoplasm of greater curvature of stomach, unspecified: Secondary | ICD-10-CM

## 2015-03-25 DIAGNOSIS — Z510 Encounter for antineoplastic radiation therapy: Secondary | ICD-10-CM | POA: Diagnosis not present

## 2015-03-25 DIAGNOSIS — C169 Malignant neoplasm of stomach, unspecified: Secondary | ICD-10-CM

## 2015-03-25 NOTE — Progress Notes (Signed)
   Department of Radiation Oncology  Phone:  [phone] Fax:        [phone]  Weekly Treatment Note    Name: Troy Barrera Date: 03/25/2015 MRN: [id number] DOB: 12-Mar-1956   Current dose: 5.4 Gy  Current fraction: 3   MEDICATIONS: Current Outpatient Prescriptions  Medication Sig Dispense Refill  . [START ON 03/28/2015] capecitabine (XELODA) 500 MG tablet Take 2 tablets (1,000 mg total) by mouth 2 (two) times daily after a meal. Take on Days of Radiation only 80 tablet 0  . glipiZIDE (GLUCOTROL XL) 5 MG 24 hr tablet TAKE ONE TABLET BY MOUTH EVERY MORNING AT BREAKFAST FOR DIABETES  5  . HYDROmorphone (DILAUDID) 4 MG tablet Take 1 tablet (4 mg total) by mouth every 4 (four) hours as needed for severe pain. (Patient not taking: Reported on 03/25/2015) 50 tablet 0  . ondansetron (ZOFRAN) 8 MG tablet Take 1 tablet (8 mg total) by mouth every 8 (eight) hours as needed for nausea or vomiting. (Patient not taking: Reported on 03/25/2015) 30 tablet 1   No current facility-administered medications for this encounter.     ALLERGIES: Review of patient's allergies indicates no known allergies.   LABORATORY DATA:  Lab Results  Component Value Date   WBC 4.4 03/11/2015   HGB 12.4* 03/11/2015   HCT 37.3* 03/11/2015   MCV 83.5 03/11/2015   PLT 201 03/11/2015   Lab Results  Component Value Date   NA 143 03/11/2015   K 3.7 03/11/2015   CL 101 01/30/2015   CO2 25 03/11/2015   Lab Results  Component Value Date   ALT 22 03/11/2015   AST 18 03/11/2015   ALKPHOS 70 03/11/2015   BILITOT 0.58 03/11/2015     NARRATIVE: [name] was seen today for weekly treatment management. The chart was checked and the patient's films were reviewed.  Mr. Kanishk is here for his 3nd treatment. He began radiation and Xeloda 03/23/2015. He denies nausea, vomiting, and diarrhea. He does not have any mouth sores. He does not have hand or foot redness. He denies abdominal pain. He has a good  appetite. He is walking about two miles a day. He mentions that his urine and stool have a strong odor which he attributes to Xeloda. His urine is not cloudy and he denies dysuria. He does not drink ensure every day because it is so thick. He is trying to gain more weight.   PHYSICAL EXAMINATION: weight is 146 lb (66.225 kg). His oral temperature is 97.3 F (36.3 C). His blood pressure is 156/89 and his pulse is 49. His respiration is 20.     ASSESSMENT: The patient is doing satisfactorily with treatment.  PLAN: We will continue with the patient's radiation treatment as planned.      This document serves as a record of services personally performed by Kyung Rudd, MD. It was created on his behalf by  Lendon Collar, a trained medical scribe. The creation of this record is based on the scribe's personal observations and the provider's statements to them. This document has been checked and approved by the attending provider.

## 2015-03-25 NOTE — Progress Notes (Addendum)
  [place] OFFICE PROGRESS NOTE   Diagnosis:  Gastric cancer  INTERVAL HISTORY:   Mr. Conover returns for follow-up. He began radiation and Xeloda 03/23/2015. He denies nausea/vomiting. No mouth sores. No diarrhea. No hand or foot pain or redness. He denies abdominal pain. He has a good appetite. He is walking about 2 miles a day. He notes that his urine and stool both have a strong odor. He attributes the odor to Xeloda. Urine is not cloudy. No dysuria.  Objective:  Vital signs in last 24 hours:  Blood pressure 130/75, pulse 52, temperature 98.6 F (37 C), temperature source Oral, resp. rate 18, height _0  (1.753 m), weight 145 lb 9.6 oz (66.044 kg), SpO2 100 %.    HEENT: No thrush or ulcers. Resp: Lungs clear bilaterally. Cardio: Regular rate and rhythm. GI: Abdomen soft and nontender. No hepatomegaly. No mass. Vascular: No leg edema. Calves soft and nontender. Skin: Palms without erythema.    Lab Results:  Lab Results  Component Value Date   WBC 4.4 03/11/2015   HGB 12.4* 03/11/2015   HCT 37.3* 03/11/2015   MCV 83.5 03/11/2015   PLT 201 03/11/2015   NEUTROABS 1.4* 03/11/2015    Imaging:  No results found.  Medications: I have reviewed the patient's current medications.  Assessment/Plan: 1. 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 Diabetes 3. Mild neutropenia. Question benign normal variant.   Disposition: Mr. Robison appears stable. He completed 1 cycle of weekly 5-FU/leucovorin. He began concurrent radiation and Xeloda 03/23/2015. We reviewed potential  toxicities associated with Xeloda including mouth sores , nausea, diarrhea, hand-foot syndrome, skin hyperpigmentation, rash. He will continue as planned. He understands to take Xeloda on days of radiation only.  We will obtain labs 2020/03/2215. He will return for a follow-up visit on 04/07/2015. He understands to contact the office in the interim with any problems.  Patient seen with Dr. Benay Spice.  Ned Card ANP/GNP-BC   03/25/2015  3:06 PM  Mr. Litton tolerated the 5-FU/leucovorin well. He has started Xeloda/radiation. We again reviewed the potential toxicities associated with Xeloda. He will return for an office and lab visit in approximately 2 weeks.  Julieanne Manson, M.D.

## 2015-03-25 NOTE — Progress Notes (Addendum)
weeklt rad txs stomach 3/25 ,  Completd, no c/o pain, no nausea, pt education done, discussed ways to manage side effects, n,v,d, fatigue skin irritation, pain,  sonafine cream given, radiation therapy and you book, teach back given, will increas ensure with ice cream, patient saw  Dr. Benay Spice today  3:57 PM BP 156/89 mmHg  Pulse 49  Temp(Src) 97.3 F (36.3 C) (Oral)  Resp 20  Wt 146 lb (66.225 kg)  Wt Readings from Last 3 Encounters:  03/25/15 146 lb (66.225 kg)  03/25/15 145 lb 9.6 oz (66.044 kg)  03/16/15 145 lb 8 oz (65.998 kg)

## 2015-03-25 NOTE — Progress Notes (Addendum)
  Radiation Oncology         [phone] ________________________________  Name: Troy Barrera MRN: [id number]  Date: 03/16/2015  DOB: 12/26/1955  SIMULATION AND TREATMENT PLANNING NOTE  DIAGNOSIS:     ICD-9-CM ICD-10-CM   1. Cancer of greater curvature of stomach (HCC) 151.6 C16.6      Site:  abdomen  NARRATIVE:  The patient was brought to the [facility].  Identity was confirmed.  All relevant records and images related to the planned course of therapy were reviewed.   Written consent to proceed with treatment was confirmed which was freely given after reviewing the details related to the planned course of therapy had been reviewed with the patient.  Then, the patient was set-up in a stable reproducible  supine position for radiation therapy.  CT images were obtained.  Surface markings were placed.    Medically necessary complex treatment device(s) for immobilization:  Customized vac-lock bag.   The CT images were loaded into the planning software.  Then the target and avoidance structures were contoured.  Treatment planning then occurred.  The radiation prescription was entered and confirmed.  I have requested : Intensity Modulated Radiotherapy (IMRT) is medically necessary for this case for the following reason:  Adequate sparing of adjacent critical normal structures including the cord and kidneys bilaterally.   The patient will undergo daily image guidance to ensure accurate localization of the target, and adequate minimize dose to the normal surrounding structures in close proximity to the target.   PLAN:  The patient will receive 45 Gy in 25 fractions. The patient will then receive a 5.4 Gy boost for a total dose of 50.4 Gy.   Special treatment procedure The patient will also receive concurrent chemotherapy during the treatment. The patient may therefore experience increased toxicity or side effects and the patient will be monitored for such problems. This may  require extra lab work as necessary. This therefore constitutes a special treatment procedure.   ________________________________   Jodelle Gross, MD, PhD

## 2015-03-25 NOTE — Telephone Encounter (Signed)
Gave and printed appt sched and avs for pt for DEc °

## 2015-03-28 ENCOUNTER — Ambulatory Visit
Admission: RE | Admit: 2015-03-28 | Discharge: 2015-03-28 | Disposition: A | Discharge status: Home / Self Care | Payer: Managed Care, Other (non HMO) | Source: Ambulatory Visit | Attending: Radiation Oncology | Admitting: Radiation Oncology

## 2015-03-28 DIAGNOSIS — Z510 Encounter for antineoplastic radiation therapy: Secondary | ICD-10-CM | POA: Diagnosis not present

## 2015-03-29 ENCOUNTER — Ambulatory Visit
Admission: RE | Admit: 2015-03-29 | Discharge: 2015-03-29 | Disposition: A | Discharge status: Home / Self Care | Payer: Managed Care, Other (non HMO) | Source: Ambulatory Visit | Attending: Radiation Oncology | Admitting: Radiation Oncology

## 2015-03-29 DIAGNOSIS — Z510 Encounter for antineoplastic radiation therapy: Secondary | ICD-10-CM | POA: Diagnosis not present

## 2015-03-30 ENCOUNTER — Ambulatory Visit
Admission: RE | Admit: 2015-03-30 | Discharge: 2015-03-30 | Disposition: A | Discharge status: Home / Self Care | Payer: Managed Care, Other (non HMO) | Source: Ambulatory Visit | Attending: Radiation Oncology | Admitting: Radiation Oncology

## 2015-03-30 DIAGNOSIS — Z510 Encounter for antineoplastic radiation therapy: Secondary | ICD-10-CM | POA: Diagnosis not present

## 2015-04-01 ENCOUNTER — Ambulatory Visit: Payer: Managed Care, Other (non HMO)

## 2015-04-01 ENCOUNTER — Other Ambulatory Visit: Payer: Managed Care, Other (non HMO)

## 2015-04-04 ENCOUNTER — Encounter: Payer: Self-pay | Admitting: Radiation Oncology

## 2015-04-04 ENCOUNTER — Ambulatory Visit: Payer: Managed Care, Other (non HMO) | Admitting: Radiation Oncology

## 2015-04-04 ENCOUNTER — Ambulatory Visit: Admission: RE | Admit: 2015-04-04 | Payer: Managed Care, Other (non HMO) | Source: Ambulatory Visit

## 2015-04-04 ENCOUNTER — Ambulatory Visit
Admission: RE | Admit: 2015-04-04 | Discharge: 2015-04-04 | Disposition: A | Discharge status: Home / Self Care | Payer: Managed Care, Other (non HMO) | Source: Ambulatory Visit | Attending: Radiation Oncology | Admitting: Radiation Oncology

## 2015-04-04 VITALS — BP 138/79 | HR 54 | Temp 98.5°F | Resp 12 | Wt 146.3 lb

## 2015-04-04 DIAGNOSIS — C166 Malignant neoplasm of greater curvature of stomach, unspecified: Secondary | ICD-10-CM

## 2015-04-04 NOTE — Progress Notes (Signed)
   Department of Radiation Oncology  Phone:  [phone] Fax:        [phone]  Weekly Treatment Note    Name: Troy Barrera Date: 04/04/2015 MRN: [id number] DOB: 1955-12-18   Current dose: 10.8 Gy  Current fraction:6   MEDICATIONS: Current Outpatient Prescriptions  Medication Sig Dispense Refill  . capecitabine (XELODA) 500 MG tablet Take 2 tablets (1,000 mg total) by mouth 2 (two) times daily after a meal. Take on Days of Radiation only 80 tablet 0  . glipiZIDE (GLUCOTROL XL) 5 MG 24 hr tablet TAKE ONE TABLET BY MOUTH EVERY MORNING AT BREAKFAST FOR DIABETES  5  . HYDROmorphone (DILAUDID) 4 MG tablet Take 1 tablet (4 mg total) by mouth every 4 (four) hours as needed for severe pain. (Patient not taking: Reported on 03/25/2015) 50 tablet 0  . ondansetron (ZOFRAN) 8 MG tablet Take 1 tablet (8 mg total) by mouth every 8 (eight) hours as needed for nausea or vomiting. (Patient not taking: Reported on 03/25/2015) 30 tablet 1   No current facility-administered medications for this encounter.     ALLERGIES: Review of patient's allergies indicates no known allergies.   LABORATORY DATA:  Lab Results  Component Value Date   WBC 4.4 03/11/2015   HGB 12.4* 03/11/2015   HCT 37.3* 03/11/2015   MCV 83.5 03/11/2015   PLT 201 03/11/2015   Lab Results  Component Value Date   NA 143 03/11/2015   K 3.7 03/11/2015   CL 101 01/30/2015   CO2 25 03/11/2015   Lab Results  Component Value Date   ALT 22 03/11/2015   AST 18 03/11/2015   ALKPHOS 70 03/11/2015   BILITOT 0.58 03/11/2015     NARRATIVE: Troy Barrera was seen today for weekly treatment management. The chart was checked and the patient's films were reviewed. PAIN:  No pain stated.  BOWEL:  Abdominal tenderness only after radiation treatments. Soft bowel movements every day.  OTHER:  Pt reports he wants to gain weight, and has been eating a diet including protein. He is dissatisfied with his weight. No nausea.    WEIGHT/VS:  Wt Readings from Last 3 Encounters:   04/04/15  146 lb 4.8 oz (66.361 kg)   03/25/15  146 lb (66.225 kg)   03/25/15  145 lb 9.6 oz (66.044 kg)     PHYSICAL EXAMINATION: weight is 146 lb 4.8 oz (66.361 kg). His oral temperature is 98.5 F (36.9 C). His blood pressure is 138/79 and his pulse is 54. His respiration is 12 and oxygen saturation is 100%.       Well healed surgical sites on abdomen.   ASSESSMENT: The patient is doing satisfactorily with treatment.  PLAN: We will continue with the patient's radiation treatment as planned. The patient will continue nutritional supplementation and he is very motivated to gain weight.  ------------------------------------------------  Jodelle Gross, MD, PhD  This document serves as a record of services personally performed by Kyung Rudd, MD. It was created on his behalf by Derek Mound, a trained medical scribe. The creation of this record is based on the scribe's personal observations and the provider's statements to them. This document has been checked and approved by the attending provider.

## 2015-04-04 NOTE — Progress Notes (Signed)
PAIN: He is currently in no pain.  BOWEL: Abdominal tenderness only after radiation treatments. Soft bowel movements every day. OTHER: Pt reports he wants to gain weight, and has been eating a diet including protein.  He is dissatisfied with his weight. WEIGHT/VS: Wt Readings from Last 3 Encounters:  04/04/15 146 lb 4.8 oz (66.361 kg)  03/25/15 146 lb (66.225 kg)  03/25/15 145 lb 9.6 oz (66.044 kg)   BP 138/79 mmHg  Pulse 54  Temp(Src) 98.5 F (36.9 C) (Oral)  Resp 12  Wt 146 lb 4.8 oz (66.361 kg)  SpO2 100%

## 2015-04-05 ENCOUNTER — Ambulatory Visit
Admission: RE | Admit: 2015-04-05 | Discharge: 2015-04-05 | Disposition: A | Discharge status: Home / Self Care | Payer: Managed Care, Other (non HMO) | Source: Ambulatory Visit | Attending: Radiation Oncology | Admitting: Radiation Oncology

## 2015-04-05 ENCOUNTER — Encounter: Payer: Self-pay | Admitting: Pharmacist

## 2015-04-05 DIAGNOSIS — Z510 Encounter for antineoplastic radiation therapy: Secondary | ICD-10-CM | POA: Diagnosis not present

## 2015-04-05 NOTE — Progress Notes (Signed)
11/29: Patient's insurance dictates that Xeloda will need to be filled through [facility] for all future refills (first fill allowed through [place]). Forms faxed to [place] at [phone]  Thank you,   Montel Clock, PharmD, [facility]

## 2015-04-06 ENCOUNTER — Ambulatory Visit
Admission: RE | Admit: 2015-04-06 | Discharge: 2015-04-06 | Disposition: A | Discharge status: Home / Self Care | Payer: Managed Care, Other (non HMO) | Source: Ambulatory Visit | Attending: Radiation Oncology | Admitting: Radiation Oncology

## 2015-04-06 DIAGNOSIS — Z510 Encounter for antineoplastic radiation therapy: Secondary | ICD-10-CM | POA: Diagnosis not present

## 2015-04-07 ENCOUNTER — Telehealth: Payer: Self-pay | Admitting: Oncology

## 2015-04-07 ENCOUNTER — Ambulatory Visit: Payer: Managed Care, Other (non HMO) | Admitting: Oncology

## 2015-04-07 ENCOUNTER — Ambulatory Visit
Admission: RE | Admit: 2015-04-07 | Discharge: 2015-04-07 | Disposition: A | Discharge status: Home / Self Care | Payer: Managed Care, Other (non HMO) | Source: Ambulatory Visit | Attending: Radiation Oncology | Admitting: Radiation Oncology

## 2015-04-07 ENCOUNTER — Other Ambulatory Visit: Payer: Managed Care, Other (non HMO)

## 2015-04-07 DIAGNOSIS — Z510 Encounter for antineoplastic radiation therapy: Secondary | ICD-10-CM | POA: Diagnosis not present

## 2015-04-07 NOTE — Telephone Encounter (Signed)
Gave and printed appt new sched pt did not want to stay until 3pm

## 2015-04-08 ENCOUNTER — Ambulatory Visit
Admission: RE | Admit: 2015-04-08 | Discharge: 2015-04-08 | Disposition: A | Discharge status: Home / Self Care | Payer: Managed Care, Other (non HMO) | Source: Ambulatory Visit | Attending: Radiation Oncology | Admitting: Radiation Oncology

## 2015-04-08 ENCOUNTER — Encounter: Payer: Self-pay | Admitting: Radiation Oncology

## 2015-04-08 DIAGNOSIS — Z510 Encounter for antineoplastic radiation therapy: Secondary | ICD-10-CM | POA: Diagnosis not present

## 2015-04-10 ENCOUNTER — Other Ambulatory Visit: Payer: Self-pay | Admitting: Oncology

## 2015-04-11 ENCOUNTER — Ambulatory Visit
Admission: RE | Admit: 2015-04-11 | Discharge: 2015-04-11 | Disposition: A | Discharge status: Home / Self Care | Payer: Managed Care, Other (non HMO) | Source: Ambulatory Visit | Attending: Radiation Oncology | Admitting: Radiation Oncology

## 2015-04-11 DIAGNOSIS — Z510 Encounter for antineoplastic radiation therapy: Secondary | ICD-10-CM | POA: Diagnosis not present

## 2015-04-12 ENCOUNTER — Ambulatory Visit: Payer: Managed Care, Other (non HMO)

## 2015-04-12 ENCOUNTER — Ambulatory Visit
Admission: RE | Admit: 2015-04-12 | Discharge: 2015-04-12 | Disposition: A | Discharge status: Home / Self Care | Payer: Managed Care, Other (non HMO) | Source: Ambulatory Visit | Attending: Radiation Oncology | Admitting: Radiation Oncology

## 2015-04-13 ENCOUNTER — Ambulatory Visit
Admission: RE | Admit: 2015-04-13 | Discharge: 2015-04-13 | Disposition: A | Discharge status: Home / Self Care | Payer: Managed Care, Other (non HMO) | Source: Ambulatory Visit | Attending: Radiation Oncology | Admitting: Radiation Oncology

## 2015-04-13 DIAGNOSIS — Z510 Encounter for antineoplastic radiation therapy: Secondary | ICD-10-CM | POA: Diagnosis not present

## 2015-04-14 ENCOUNTER — Ambulatory Visit
Admission: RE | Admit: 2015-04-14 | Discharge: 2015-04-14 | Disposition: A | Discharge status: Home / Self Care | Payer: Managed Care, Other (non HMO) | Source: Ambulatory Visit | Attending: Radiation Oncology | Admitting: Radiation Oncology

## 2015-04-14 ENCOUNTER — Telehealth: Payer: Self-pay | Admitting: Oncology

## 2015-04-14 ENCOUNTER — Ambulatory Visit: Payer: Managed Care, Other (non HMO) | Admitting: Radiation Oncology

## 2015-04-14 DIAGNOSIS — Z510 Encounter for antineoplastic radiation therapy: Secondary | ICD-10-CM | POA: Diagnosis not present

## 2015-04-14 NOTE — Telephone Encounter (Signed)
returned call and lvm for pt confirming DEC appt....pt ok and aware

## 2015-04-15 ENCOUNTER — Other Ambulatory Visit (HOSPITAL_BASED_OUTPATIENT_CLINIC_OR_DEPARTMENT_OTHER): Payer: Managed Care, Other (non HMO)

## 2015-04-15 ENCOUNTER — Telehealth: Payer: Self-pay | Admitting: Physician Assistant

## 2015-04-15 ENCOUNTER — Ambulatory Visit: Payer: Managed Care, Other (non HMO) | Admitting: Radiation Oncology

## 2015-04-15 ENCOUNTER — Ambulatory Visit (HOSPITAL_BASED_OUTPATIENT_CLINIC_OR_DEPARTMENT_OTHER): Payer: Managed Care, Other (non HMO) | Admitting: Physician Assistant

## 2015-04-15 ENCOUNTER — Ambulatory Visit
Admission: RE | Admit: 2015-04-15 | Discharge: 2015-04-15 | Disposition: A | Discharge status: Home / Self Care | Payer: Managed Care, Other (non HMO) | Source: Ambulatory Visit | Attending: Radiation Oncology | Admitting: Radiation Oncology

## 2015-04-15 ENCOUNTER — Encounter: Payer: Self-pay | Admitting: Radiation Oncology

## 2015-04-15 VITALS — Ht 69.0 in | Wt 147.2 lb

## 2015-04-15 VITALS — BP 158/90 | HR 56 | Temp 98.9°F | Resp 20 | Ht 69.0 in | Wt 146.2 lb

## 2015-04-15 DIAGNOSIS — E119 Type 2 diabetes mellitus without complications: Secondary | ICD-10-CM | POA: Diagnosis not present

## 2015-04-15 DIAGNOSIS — D709 Neutropenia, unspecified: Secondary | ICD-10-CM | POA: Diagnosis not present

## 2015-04-15 DIAGNOSIS — C166 Malignant neoplasm of greater curvature of stomach, unspecified: Secondary | ICD-10-CM | POA: Diagnosis not present

## 2015-04-15 DIAGNOSIS — C169 Malignant neoplasm of stomach, unspecified: Secondary | ICD-10-CM

## 2015-04-15 DIAGNOSIS — Z510 Encounter for antineoplastic radiation therapy: Secondary | ICD-10-CM | POA: Diagnosis not present

## 2015-04-15 DIAGNOSIS — Z5111 Encounter for antineoplastic chemotherapy: Secondary | ICD-10-CM

## 2015-04-15 LAB — CBC WITH DIFFERENTIAL/PLATELET
BASO%: 0.7 % (ref 0.0–2.0)
Basophils Absolute: 0 10*3/uL (ref 0.0–0.1)
EOS%: 1.9 % (ref 0.0–7.0)
Eosinophils Absolute: 0.1 10*3/uL (ref 0.0–0.5)
HEMATOCRIT: 37 % — AB (ref 38.4–49.9)
HGB: 12.5 g/dL — ABNORMAL LOW (ref 13.0–17.1)
LYMPH#: 0.5 10*3/uL — AB (ref 0.9–3.3)
LYMPH%: 11.5 % — ABNORMAL LOW (ref 14.0–49.0)
MCH: 28.7 pg (ref 27.2–33.4)
MCHC: 33.8 g/dL (ref 32.0–36.0)
MCV: 84.9 fL (ref 79.3–98.0)
MONO#: 1.2 10*3/uL — ABNORMAL HIGH (ref 0.1–0.9)
MONO%: 27.6 % — ABNORMAL HIGH (ref 0.0–14.0)
NEUT%: 58.3 % (ref 39.0–75.0)
NEUTROS ABS: 2.4 10*3/uL (ref 1.5–6.5)
Platelets: 196 10*3/uL (ref 140–400)
RBC: 4.36 10*6/uL (ref 4.20–5.82)
RDW: 19.7 % — AB (ref 11.0–14.6)
WBC: 4.2 10*3/uL (ref 4.0–10.3)

## 2015-04-15 LAB — COMPREHENSIVE METABOLIC PANEL
ALK PHOS: 77 U/L (ref 40–150)
ALT: 14 U/L (ref 0–55)
AST: 18 U/L (ref 5–34)
Albumin: 4.2 g/dL (ref 3.5–5.0)
Anion Gap: 11 mEq/L (ref 3–11)
BUN: 12.8 mg/dL (ref 7.0–26.0)
CALCIUM: 8.7 mg/dL (ref 8.4–10.4)
CHLORIDE: 109 meq/L (ref 98–109)
CO2: 21 meq/L — AB (ref 22–29)
Creatinine: 0.9 mg/dL (ref 0.7–1.3)
EGFR: >90 mL/min/{1.73_m2} (ref >90)
Glucose: 92 mg/dl (ref 70–140)
Potassium: 3.8 mEq/L (ref 3.5–5.1)
Sodium: 141 mEq/L (ref 136–145)
Total Bilirubin: 0.74 mg/dL (ref 0.20–1.20)
Total Protein: 7.5 g/dL (ref 6.4–8.3)

## 2015-04-15 NOTE — Progress Notes (Signed)
  Radiation Oncology         [phone]     Name: Troy Barrera MRN: [id number]   Date: 04/15/2015  DOB: 1955/12/11   Weekly Radiation Therapy Management    ICD-9-CM ICD-10-CM   1. 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  Ethanjames Werling has completed 14 fractions to his stomach. He denies having pain and nausea. He is taking Xeloda. He reports his bowel movements have a bad odor and attributes it to the Xeloda. He is having one bowel movement per day. He denies having any skin changes on his abdomen. He reports he is drinking 1 ensure every other day and wants to gain weight. He denies having fatigue and is walking 5-6 miles a day. He saw Dr. Benay Spice today and vitals were done at that visit.  The patient is without complaint. Set-up films were reviewed. The chart was checked.  Physical Findings  height is 5\' 9"  (1.753 m) and weight is 147 lb 3.2 oz (66.769 kg). . Weight essentially stable.  No significant changes.  Impression The patient is tolerating radiation.  Plan Continue treatment as planned.         Sheral Apley Tammi Klippel, M.D.  This document serves as a record of services personally performed by Tyler Pita, MD. It was created on his behalf by Arlyce Harman, a trained medical scribe. The creation of this record is based on the scribe's personal observations and the provider's statements to them. This document has been checked and approved by the attending provider.

## 2015-04-15 NOTE — Progress Notes (Signed)
Troy Barrera has completed 14 fractions to his stomach.  He denies having pain and nausea.  He is taking Xeloda.  He reports his bowel movements have a bad odor and attributes it to the Xeloda.  He is having one bowel movement per day.  He denies having any skin changes on his abdomen.  He reports he is drinking 1 ensure every other day and wants to gain weight.  He denies having fatigue and is walking 5-6 miles a day.  He saw Dr. Benay Spice today and vitals were done at that visit.     Ht 5\' 9"  (1.753 m)  Wt 147 lb 3.2 oz (66.769 kg)  BMI 21.73 kg/m2   Wt Readings from Last 3 Encounters:  04/15/15 147 lb 3.2 oz (66.769 kg)  04/15/15 146 lb 3.2 oz (66.316 kg)  04/08/15 147 lb 9.6 oz (66.951 kg)

## 2015-04-15 NOTE — Telephone Encounter (Signed)
per pof to schpt appt-gave tp copyp f avs

## 2015-04-15 NOTE — Progress Notes (Signed)
  [place] OFFICE PROGRESS NOTE   Diagnosis:  Gastric cancer  INTERVAL HISTORY:   Troy Barrera returns for follow-up. He began radiation and Xeloda 03/23/2015. He denies nausea/vomiting. No mouth sores. No diarrhea. No hand or foot pain or redness. He denies abdominal pain. He has a good appetite. He is walking about 2 miles a day. He notes that his urine and stool both have a strong odor. He attributes the odor to Xeloda. Urine is not cloudy. No dysuria.No bleeding lesions noted.  Objective:  Vital signs in last 24 hours:  Blood pressure 158/90, pulse 56, temperature 98.9 F (37.2 C), temperature source Oral, resp. rate 20, height $RemoveBe'5\' 9"'GWGJwnXVB$  (1.753 m), weight 146 lb 3.2 oz (66.316 kg), SpO2 100 %.    HEENT: No thrush or ulcers. Resp: Lungs clear bilaterally. Cardio: Regular rate and rhythm. GI: Abdomen soft and nontender. No hepatomegaly. No mass. Vascular: No leg edema. Calves soft and nontender. Skin: Palms without erythema.    Lab Results:  Lab Results  Component Value Date   WBC 4.2 04/15/2015   HGB 12.5* 04/15/2015   HCT 37.0* 04/15/2015   MCV 84.9 04/15/2015   PLT 196 04/15/2015   NEUTROABS 2.4 04/15/2015   CMP Latest Ref Rng 04/15/2015 03/11/2015 02/25/2015  Glucose 70 - 140 mg/dl 92 86 105  BUN 7.0 - 26.0 mg/dL 12.8 8.5 11.2  Creatinine 0.7 - 1.3 mg/dL 0.9 1.0 1.0  Sodium 136 - 145 mEq/L 141 143 143  Potassium 3.5 - 5.1 mEq/L 3.8 3.7 3.6  Chloride 101 - 111 mmol/L - - -  CO2 22 - 29 mEq/L 21(L) 25 26  Calcium 8.4 - 10.4 mg/dL 8.7 9.4 9.2  Total Protein 6.4 - 8.3 g/dL 7.5 7.0 6.5  Total Bilirubin 0.20 - 1.20 mg/dL 0.74 0.58 0.45  Alkaline Phos 40 - 150 U/L 77 70 60  AST 5 - 34 U/L $Remo'18 18 15  'dfwBG$ ALT 0 - 55 U/L $Remo'14 22 18    'dfslY$ Imaging:  No results found.  Medications: I have reviewed the patient's current medications.  Assessment/Plan: 1. 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 Diabetes 3. Mild neutropenia. Question benign normal variant.   Disposition: Troy Barrera appears stable.  He began concurrent radiation and Xeloda 03/23/2015.   The plan is to continue taking Xeloda while on XRT, once discontinuation of radiation on 05/06/2015, he will have a 3 to four-week break, prior to reinitiating chemotherapy with weekly 5-FU and leucovorin. He will be returning to the office on 04/29/2015 at 9:30 AM for a follow-up at which time new labs will be drawn    Patient seen with Dr. Benay Spice.  [facility] ANP/GNP-BC   04/15/2015  11:50 AM   This was a shared visit with Sherrilyn Rist. Troy Barrera was interviewed and examined. He appears to be tolerating the adjuvant radiation and Xeloda well. He will return for an office visit in 2 weeks.  Julieanne Manson, M.D.

## 2015-04-18 ENCOUNTER — Ambulatory Visit
Admission: RE | Admit: 2015-04-18 | Discharge: 2015-04-18 | Disposition: A | Discharge status: Home / Self Care | Payer: Managed Care, Other (non HMO) | Source: Ambulatory Visit | Attending: Radiation Oncology | Admitting: Radiation Oncology

## 2015-04-18 DIAGNOSIS — Z510 Encounter for antineoplastic radiation therapy: Secondary | ICD-10-CM | POA: Diagnosis not present

## 2015-04-19 ENCOUNTER — Ambulatory Visit
Admission: RE | Admit: 2015-04-19 | Discharge: 2015-04-19 | Disposition: A | Discharge status: Home / Self Care | Payer: Managed Care, Other (non HMO) | Source: Ambulatory Visit | Attending: Radiation Oncology | Admitting: Radiation Oncology

## 2015-04-19 DIAGNOSIS — Z510 Encounter for antineoplastic radiation therapy: Secondary | ICD-10-CM | POA: Diagnosis not present

## 2015-04-20 ENCOUNTER — Ambulatory Visit
Admission: RE | Admit: 2015-04-20 | Discharge: 2015-04-20 | Disposition: A | Discharge status: Home / Self Care | Payer: Managed Care, Other (non HMO) | Source: Ambulatory Visit | Attending: Radiation Oncology | Admitting: Radiation Oncology

## 2015-04-20 ENCOUNTER — Other Ambulatory Visit: Payer: Self-pay | Admitting: Oncology

## 2015-04-20 ENCOUNTER — Encounter: Payer: Self-pay | Admitting: Radiation Oncology

## 2015-04-20 VITALS — BP 150/72 | HR 48 | Temp 97.8°F | Resp 20 | Wt 146.7 lb

## 2015-04-20 DIAGNOSIS — Z510 Encounter for antineoplastic radiation therapy: Secondary | ICD-10-CM | POA: Diagnosis not present

## 2015-04-20 DIAGNOSIS — C166 Malignant neoplasm of greater curvature of stomach, unspecified: Secondary | ICD-10-CM

## 2015-04-20 NOTE — Progress Notes (Signed)
Weekly rad tx gastric   17 completed,  Regular soft formed stools, no gas, no nausa, takes xeloda at night, walks for exercise, no pain, energy level good BP 150/72 mmHg  Pulse 48  Temp(Src) 97.8 F (36.6 C) (Oral)  Resp 20  Wt 146 lb 11.2 oz (66.543 kg)  SpO2 100%  Wt Readings from Last 3 Encounters:  04/20/15 146 lb 11.2 oz (66.543 kg)  04/15/15 147 lb 3.2 oz (66.769 kg)  04/15/15 146 lb 3.2 oz (66.316 kg)

## 2015-04-20 NOTE — Progress Notes (Signed)
   Department of Radiation Oncology  Phone:  [phone] Fax:        [phone]  Weekly Treatment Note    Name: Troy Barrera Date: 04/20/2015 MRN: [id number] DOB: [birth year]   Current dose: 30.6 Gy  Current fraction: 17   MEDICATIONS: Current Outpatient Prescriptions  Medication Sig Dispense Refill  . capecitabine (XELODA) 500 MG tablet Take 2 tablets (1,000 mg total) by mouth 2 (two) times daily after a meal. Take on Days of Radiation only 80 tablet 0  . glipiZIDE (GLUCOTROL XL) 5 MG 24 hr tablet TAKE ONE TABLET BY MOUTH EVERY MORNING AT BREAKFAST FOR DIABETES  5  . HYDROmorphone (DILAUDID) 4 MG tablet Take 1 tablet (4 mg total) by mouth every 4 (four) hours as needed for severe pain. (Patient not taking: Reported on 03/25/2015) 50 tablet 0  . ondansetron (ZOFRAN) 8 MG tablet Take 1 tablet (8 mg total) by mouth every 8 (eight) hours as needed for nausea or vomiting. (Patient not taking: Reported on 03/25/2015) 30 tablet 1   No current facility-administered medications for this encounter.     ALLERGIES: Review of patient's allergies indicates no known allergies.   LABORATORY DATA:  Lab Results  Component Value Date   WBC 4.2 04/15/2015   HGB 12.5* 04/15/2015   HCT 37.0* 04/15/2015   MCV 84.9 04/15/2015   PLT 196 04/15/2015   Lab Results  Component Value Date   NA 141 04/15/2015   K 3.8 04/15/2015   CL 101 01/30/2015   CO2 21* 04/15/2015   Lab Results  Component Value Date   ALT 14 04/15/2015   AST 18 04/15/2015   ALKPHOS 77 04/15/2015   BILITOT 0.74 04/15/2015     NARRATIVE: Troy Barrera was seen today for weekly treatment management. The chart was checked and the patient's films were reviewed.  Weekly radiation treatment gastric 17 completed. Patient has regular, soft formed stools. He denies gas and nausea. He takes xeloda at night. He walks for exercise. He denies pain. He states his energy level is good. He only drinks Ensure every other day  instead of every day.  PHYSICAL EXAMINATION: weight is 146 lb 11.2 oz (66.543 kg). His oral temperature is 97.8 F (36.6 C). His blood pressure is 150/72 and his pulse is 48. His respiration is 20 and oxygen saturation is 100%.        Well healed surgical sites on abdomen.   ASSESSMENT: The patient is doing satisfactorily with treatment.  PLAN: We will continue with the patient's radiation treatment as planned. The patient will continue nutritional supplementation and he is very motivated to gain weight.  ------------------------------------------------  Jodelle Gross, MD, PhD  This document serves as a record of services personally performed by Kyung Rudd, MD. It was created on his behalf by  Lendon Collar, a trained medical scribe. The creation of this record is based on the scribe's personal observations and the provider's statements to them. This document has been checked and approved by the attending provider.

## 2015-04-21 ENCOUNTER — Ambulatory Visit
Admission: RE | Admit: 2015-04-21 | Discharge: 2015-04-21 | Disposition: A | Discharge status: Home / Self Care | Payer: Managed Care, Other (non HMO) | Source: Ambulatory Visit | Attending: Radiation Oncology | Admitting: Radiation Oncology

## 2015-04-21 DIAGNOSIS — Z510 Encounter for antineoplastic radiation therapy: Secondary | ICD-10-CM | POA: Diagnosis not present

## 2015-04-22 ENCOUNTER — Ambulatory Visit
Admission: RE | Admit: 2015-04-22 | Discharge: 2015-04-22 | Disposition: A | Discharge status: Home / Self Care | Payer: Managed Care, Other (non HMO) | Source: Ambulatory Visit | Attending: Radiation Oncology | Admitting: Radiation Oncology

## 2015-04-22 DIAGNOSIS — Z510 Encounter for antineoplastic radiation therapy: Secondary | ICD-10-CM | POA: Diagnosis not present

## 2015-04-25 ENCOUNTER — Ambulatory Visit
Admission: RE | Admit: 2015-04-25 | Discharge: 2015-04-25 | Disposition: A | Discharge status: Home / Self Care | Payer: Managed Care, Other (non HMO) | Source: Ambulatory Visit | Attending: Radiation Oncology | Admitting: Radiation Oncology

## 2015-04-25 ENCOUNTER — Encounter: Payer: Self-pay | Admitting: *Deleted

## 2015-04-25 ENCOUNTER — Encounter: Payer: Self-pay | Admitting: Radiation Oncology

## 2015-04-25 DIAGNOSIS — Z510 Encounter for antineoplastic radiation therapy: Secondary | ICD-10-CM | POA: Diagnosis not present

## 2015-04-25 NOTE — Progress Notes (Signed)
Oncology Nurse Navigator Documentation  Oncology Nurse Navigator Flowsheets 04/25/2015  Referral date to RadOnc/MedOnc -  Navigator Encounter Type Treatment  Patient Visit Type Radonc  Treatment Phase Treatment #20-RT + Xeloda bid  Barriers/Navigation Needs No barriers at this time-feeling well, exercises and walks daily. 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  Education -  Interventions -  Referrals -  Coordination of Care -  Education Method -  Support Groups/Services -  Time Spent with Patient 15

## 2015-04-26 ENCOUNTER — Ambulatory Visit
Admission: RE | Admit: 2015-04-26 | Discharge: 2015-04-26 | Disposition: A | Discharge status: Home / Self Care | Payer: Managed Care, Other (non HMO) | Source: Ambulatory Visit | Attending: Radiation Oncology | Admitting: Radiation Oncology

## 2015-04-26 DIAGNOSIS — Z510 Encounter for antineoplastic radiation therapy: Secondary | ICD-10-CM | POA: Diagnosis not present

## 2015-04-27 ENCOUNTER — Ambulatory Visit
Admission: RE | Admit: 2015-04-27 | Discharge: 2015-04-27 | Disposition: A | Discharge status: Home / Self Care | Payer: Managed Care, Other (non HMO) | Source: Ambulatory Visit | Attending: Radiation Oncology | Admitting: Radiation Oncology

## 2015-04-27 DIAGNOSIS — Z510 Encounter for antineoplastic radiation therapy: Secondary | ICD-10-CM | POA: Diagnosis not present

## 2015-04-28 ENCOUNTER — Ambulatory Visit
Admission: RE | Admit: 2015-04-28 | Discharge: 2015-04-28 | Disposition: A | Discharge status: Home / Self Care | Payer: Managed Care, Other (non HMO) | Source: Ambulatory Visit | Attending: Radiation Oncology | Admitting: Radiation Oncology

## 2015-04-28 DIAGNOSIS — Z510 Encounter for antineoplastic radiation therapy: Secondary | ICD-10-CM | POA: Diagnosis not present

## 2015-04-29 ENCOUNTER — Other Ambulatory Visit (HOSPITAL_BASED_OUTPATIENT_CLINIC_OR_DEPARTMENT_OTHER): Payer: Managed Care, Other (non HMO)

## 2015-04-29 ENCOUNTER — Ambulatory Visit: Payer: Managed Care, Other (non HMO)

## 2015-04-29 ENCOUNTER — Ambulatory Visit (HOSPITAL_BASED_OUTPATIENT_CLINIC_OR_DEPARTMENT_OTHER): Payer: Managed Care, Other (non HMO) | Admitting: Physician Assistant

## 2015-04-29 ENCOUNTER — Ambulatory Visit
Admission: RE | Admit: 2015-04-29 | Discharge: 2015-04-29 | Disposition: A | Discharge status: Home / Self Care | Payer: Managed Care, Other (non HMO) | Source: Ambulatory Visit | Attending: Radiation Oncology | Admitting: Radiation Oncology

## 2015-04-29 ENCOUNTER — Telehealth: Payer: Self-pay | Admitting: Oncology

## 2015-04-29 VITALS — BP 145/74 | HR 50 | Temp 98.3°F | Resp 18 | Ht 69.0 in | Wt 141.7 lb

## 2015-04-29 VITALS — BP 157/88 | HR 56 | Temp 98.9°F | Ht 69.0 in | Wt 143.3 lb

## 2015-04-29 DIAGNOSIS — C166 Malignant neoplasm of greater curvature of stomach, unspecified: Secondary | ICD-10-CM

## 2015-04-29 DIAGNOSIS — Z5111 Encounter for antineoplastic chemotherapy: Secondary | ICD-10-CM

## 2015-04-29 DIAGNOSIS — Z510 Encounter for antineoplastic radiation therapy: Secondary | ICD-10-CM | POA: Diagnosis not present

## 2015-04-29 LAB — CBC WITH DIFFERENTIAL/PLATELET
BASO%: 1.2 % (ref 0.0–2.0)
Basophils Absolute: 0 10*3/uL (ref 0.0–0.1)
EOS ABS: 0.1 10*3/uL (ref 0.0–0.5)
EOS%: 2.4 % (ref 0.0–7.0)
HCT: 37.9 % — ABNORMAL LOW (ref 38.4–49.9)
HGB: 13 g/dL (ref 13.0–17.1)
LYMPH%: 11.9 % — AB (ref 14.0–49.0)
MCH: 29.5 pg (ref 27.2–33.4)
MCHC: 34.3 g/dL (ref 32.0–36.0)
MCV: 86.1 fL (ref 79.3–98.0)
MONO#: 0.7 10*3/uL (ref 0.1–0.9)
MONO%: 28.6 % — AB (ref 0.0–14.0)
NEUT%: 55.9 % (ref 39.0–75.0)
NEUTROS ABS: 1.4 10*3/uL — AB (ref 1.5–6.5)
PLATELETS: 230 10*3/uL (ref 140–400)
RBC: 4.4 10*6/uL (ref 4.20–5.82)
RDW: 19.1 % — ABNORMAL HIGH (ref 11.0–14.6)
WBC: 2.5 10*3/uL — AB (ref 4.0–10.3)
lymph#: 0.3 10*3/uL — ABNORMAL LOW (ref 0.9–3.3)

## 2015-04-29 LAB — COMPREHENSIVE METABOLIC PANEL
ALT: 15 U/L (ref 0–55)
ANION GAP: 9 meq/L (ref 3–11)
AST: 20 U/L (ref 5–34)
Albumin: 4.4 g/dL (ref 3.5–5.0)
Alkaline Phosphatase: 75 U/L (ref 40–150)
BUN: 8.9 mg/dL (ref 7.0–26.0)
CHLORIDE: 106 meq/L (ref 98–109)
CO2: 24 meq/L (ref 22–29)
Calcium: 9.4 mg/dL (ref 8.4–10.4)
Creatinine: 1.1 mg/dL (ref 0.7–1.3)
EGFR: 83 mL/min/{1.73_m2} — AB (ref >90)
GLUCOSE: 153 mg/dL — AB (ref 70–140)
POTASSIUM: 4.2 meq/L (ref 3.5–5.1)
SODIUM: 139 meq/L (ref 136–145)
TOTAL PROTEIN: 7.7 g/dL (ref 6.4–8.3)
Total Bilirubin: 0.67 mg/dL (ref 0.20–1.20)

## 2015-04-29 NOTE — Progress Notes (Signed)
Me. Eshbaugh has received 24 fractions.  Denies having any pain today.  Appetite good.  No change in bowel or bladder functions.  Energy level is good still able to walk wit some running eight to ten miles on a track a day.\ To see medical oncologist today Dr. Benay Spice  BP 157/88 mmHg  Pulse 56  Temp(Src) 98.9 F (37.2 C) (Oral)  Ht 5\' 9"  (1.753 m)  Wt 143 lb 4.8 oz (65 kg)  BMI 21.15 kg/m2  SpO2 100%  Wt Readings from Last 3 Encounters:  04/29/15 143 lb 4.8 oz (65 kg)  04/20/15 146 lb 11.2 oz (66.543 kg)  04/15/15 147 lb 3.2 oz (66.769 kg)

## 2015-04-29 NOTE — Progress Notes (Signed)
   Department of Radiation Oncology  Phone:  [phone] Fax:        [phone]  Weekly Treatment Note    Name: Troy Barrera Date: 04/29/2015 MRN: [id number] DOB: Oct 17, 1955   Current dose:  43.2 Gy  Current fraction: 24   MEDICATIONS: Current Outpatient Prescriptions  Medication Sig Dispense Refill  . capecitabine (XELODA) 500 MG tablet Take 2 tablets (1,000 mg total) by mouth 2 (two) times daily after a meal. Take on Days of Radiation only 80 tablet 0  . glipiZIDE (GLUCOTROL XL) 5 MG 24 hr tablet TAKE ONE TABLET BY MOUTH EVERY MORNING AT BREAKFAST FOR DIABETES  5  . HYDROmorphone (DILAUDID) 4 MG tablet Take 1 tablet (4 mg total) by mouth every 4 (four) hours as needed for severe pain. (Patient not taking: Reported on 03/25/2015) 50 tablet 0  . ondansetron (ZOFRAN) 8 MG tablet Take 1 tablet (8 mg total) by mouth every 8 (eight) hours as needed for nausea or vomiting. (Patient not taking: Reported on 03/25/2015) 30 tablet 1   No current facility-administered medications for this encounter.     ALLERGIES: Review of patient's allergies indicates no known allergies.   LABORATORY DATA:  Lab Results  Component Value Date   WBC 2.5* 04/29/2015   HGB 13.0 04/29/2015   HCT 37.9* 04/29/2015   MCV 86.1 04/29/2015   PLT 230 04/29/2015   Lab Results  Component Value Date   NA 139 04/29/2015   K 4.2 04/29/2015   CL 101 01/30/2015   CO2 24 04/29/2015   Lab Results  Component Value Date   ALT 15 04/29/2015   AST 20 04/29/2015   ALKPHOS 75 04/29/2015   BILITOT 0.67 04/29/2015     NARRATIVE: Troy Barrera was seen today for weekly treatment management. The chart was checked and the patient's films were reviewed.  Troy Barrera has received 24 fractions. Denies any pain today. Appetite and energy levels are good. No change in bowel or bladder functions. He is doing physical activity through running and walking. He is to see medical oncology today, Dr. Benay Spice  PHYSICAL  EXAMINATION: height is 5\' 9"  (1.753 m) and weight is 143 lb 4.8 oz (65 kg). His oral temperature is 98.9 F (37.2 C). His blood pressure is 157/88 and his pulse is 56. His oxygen saturation is 100%.         ASSESSMENT: The patient is doing satisfactorily with treatment.  PLAN: We will continue with the patient's radiation treatment as planned. The patient will continue nutritional supplementation and he is motivated to gain weight.  ------------------------------------------------  Jodelle Gross, MD, PhD  This document serves as a record of services personally performed by Kyung Rudd, MD. It was created on his behalf by Darcus Austin, a trained medical scribe. The creation of this record is based on the scribe's personal observations and the provider's statements to them. This document has been checked and approved by the attending provider.

## 2015-04-29 NOTE — Telephone Encounter (Signed)
Added additional lab for 12/30. Confirmed with SW patient to return 12/30 for lab only. Spoke with patient he is aware.

## 2015-04-29 NOTE — Progress Notes (Signed)
  Radiation Oncology         [phone] ________________________________  Name: Troy Barrera MRN: [id number]  Date: 04/25/2015  DOB: July 27, 1955  COMPLEX SIMULATION  NOTE  Diagnosis: gastric cancer  Narrative The patient has initially been planned to receive a course of radiation treatment to a dose of 45 gray in 25 fractions at 1.8 gray per fraction. The patient will now receive a boost to the high risk target volume for an additional 5.4 gray. This will be delivered in 3 fractions at 1.8 gray per fraction and a cone down boost technique will be utilized. To accomplish this, an IMRT boost has been designed for this purpose. A complex isodose plan is requested to ensure that the high-risk target region receives the appropriate radiation dose and that the nearby normal structures continue to be appropriately spared. The patient's final total dose therefore will be 50.4 gray.   ________________________________ ------------------------------------------------  Jodelle Gross, MD, PhD

## 2015-04-29 NOTE — Progress Notes (Signed)
  Troy Barrera OFFICE PROGRESS NOTE   Diagnosis:  Gastric cancer  INTERVAL HISTORY:   Troy Barrera returns for follow-up. He began radiation and Xeloda 03/23/2015. He denies nausea/vomiting. No mouth sores. No diarrhea. No hand or foot pain or redness. He denies abdominal pain. He has a good appetite. He is walking about 2 miles a day. He notes that his urine and stool both have a strong odor. He attributes the odor to Xeloda. Urine is not cloudy. No dysuria. No bleeding lesions noted.  Objective:  Vital signs in last 24 hours:  Blood pressure 145/74, pulse 50, temperature 98.3 F (36.8 C), temperature source Oral, resp. rate 18, height '5\' 9"'$  (1.753 m), weight 141 lb 11.2 oz (64.275 kg), SpO2 100 %.    HEENT: No thrush or ulcers. Resp: Lungs clear bilaterally. Cardio: Regular rate and rhythm. GI: Abdomen soft and nontender. No hepatomegaly. No mass. Vascular: No leg edema. Calves soft and nontender. Skin: Palms without erythema. Mild discoloration. No open sores.   Lab Results:  Lab Results  Component Value Date   WBC 2.5* 04/29/2015   HGB 13.0 04/29/2015   HCT 37.9* 04/29/2015   MCV 86.1 04/29/2015   PLT 230 04/29/2015   NEUTROABS 1.4* 04/29/2015   CMP Latest Ref Rng 04/29/2015 04/15/2015 03/11/2015  Glucose 70 - 140 mg/dl 153(H) 92 86  BUN 7.0 - 26.0 mg/dL 8.9 12.8 8.5  Creatinine 0.7 - 1.3 mg/dL 1.1 0.9 1.0  Sodium 136 - 145 mEq/L 139 141 143  Potassium 3.5 - 5.1 mEq/L 4.2 3.8 3.7  Chloride 101 - 111 mmol/L - - -  CO2 22 - 29 mEq/L 24 21(L) 25  Calcium 8.4 - 10.4 mg/dL 9.4 8.7 9.4  Total Protein 6.4 - 8.3 g/dL 7.7 7.5 7.0  Total Bilirubin 0.20 - 1.20 mg/dL 0.67 0.74 0.58  Alkaline Phos 40 - 150 U/L 75 77 70  AST 5 - 34 U/L '20 18 18  '$ ALT 0 - 55 U/L '15 14 22    '$ Imaging:  No results found.  Medications: I have reviewed the patient's current medications.  Assessment/Plan: 1. 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 Diabetes 3. Mild neutropenia. Question benign normal variant.   Disposition: Troy Barrera appears stable.  He began concurrent radiation and Xeloda 03/23/2015.  The plan is to continue taking Xeloda while on XRT, once discontinuation of radiation on 05/06/2015, he will have a 3 week break, prior to reinitiating chemotherapy with weekly 5-FU and leucovorin, planning 2 rounds of 4 weekly treatments with 5 FU and LV and 2 weeks off.  Patient agrees with plan He will return prior to his reinnitiation of treatment  Patient seen with Dr. Benay Spice as part of shared visit.  [facility] ANP/GNP-BC   04/29/2015  11:55 AM This was a shared visit with Sharene Butters. Troy Barrera is tolerating the radiation and Xeloda well. He will return on 05/27/2015 to resume adjuvant 5-FU/leucovorin.  Julieanne Manson, M.D.

## 2015-04-29 NOTE — Telephone Encounter (Signed)
Gave patient avs report and appointments for January and February. Per BS ok for appointments to be scheduled on fridays with next date being 1/20 - patient off on Fridays.

## 2015-05-03 ENCOUNTER — Ambulatory Visit
Admission: RE | Admit: 2015-05-03 | Discharge: 2015-05-03 | Disposition: A | Discharge status: Home / Self Care | Payer: Managed Care, Other (non HMO) | Source: Ambulatory Visit | Attending: Radiation Oncology | Admitting: Radiation Oncology

## 2015-05-03 ENCOUNTER — Ambulatory Visit: Payer: Managed Care, Other (non HMO)

## 2015-05-03 DIAGNOSIS — Z510 Encounter for antineoplastic radiation therapy: Secondary | ICD-10-CM | POA: Diagnosis not present

## 2015-05-04 ENCOUNTER — Ambulatory Visit: Payer: Managed Care, Other (non HMO)

## 2015-05-04 ENCOUNTER — Ambulatory Visit
Admission: RE | Admit: 2015-05-04 | Discharge: 2015-05-04 | Disposition: A | Discharge status: Home / Self Care | Payer: Managed Care, Other (non HMO) | Source: Ambulatory Visit | Attending: Radiation Oncology | Admitting: Radiation Oncology

## 2015-05-04 DIAGNOSIS — Z510 Encounter for antineoplastic radiation therapy: Secondary | ICD-10-CM | POA: Diagnosis not present

## 2015-05-05 ENCOUNTER — Ambulatory Visit
Admission: RE | Admit: 2015-05-05 | Discharge: 2015-05-05 | Disposition: A | Discharge status: Home / Self Care | Payer: Managed Care, Other (non HMO) | Source: Ambulatory Visit | Attending: Radiation Oncology | Admitting: Radiation Oncology

## 2015-05-05 ENCOUNTER — Ambulatory Visit: Payer: Managed Care, Other (non HMO)

## 2015-05-05 DIAGNOSIS — Z510 Encounter for antineoplastic radiation therapy: Secondary | ICD-10-CM | POA: Diagnosis not present

## 2015-05-06 ENCOUNTER — Ambulatory Visit
Admission: RE | Admit: 2015-05-06 | Discharge: 2015-05-06 | Disposition: A | Discharge status: Home / Self Care | Payer: Managed Care, Other (non HMO) | Source: Ambulatory Visit | Attending: Radiation Oncology | Admitting: Radiation Oncology

## 2015-05-06 ENCOUNTER — Ambulatory Visit: Payer: Managed Care, Other (non HMO)

## 2015-05-06 ENCOUNTER — Encounter: Payer: Self-pay | Admitting: Radiation Oncology

## 2015-05-06 ENCOUNTER — Encounter: Payer: Self-pay | Admitting: *Deleted

## 2015-05-06 ENCOUNTER — Other Ambulatory Visit (HOSPITAL_BASED_OUTPATIENT_CLINIC_OR_DEPARTMENT_OTHER): Payer: Managed Care, Other (non HMO)

## 2015-05-06 VITALS — BP 127/75 | HR 48 | Temp 98.4°F | Ht 69.0 in | Wt 140.0 lb

## 2015-05-06 DIAGNOSIS — C166 Malignant neoplasm of greater curvature of stomach, unspecified: Secondary | ICD-10-CM

## 2015-05-06 DIAGNOSIS — C169 Malignant neoplasm of stomach, unspecified: Secondary | ICD-10-CM

## 2015-05-06 DIAGNOSIS — Z510 Encounter for antineoplastic radiation therapy: Secondary | ICD-10-CM | POA: Diagnosis not present

## 2015-05-06 LAB — CBC WITH DIFFERENTIAL/PLATELET
BASO%: 1.6 % (ref 0.0–2.0)
BASOS ABS: 0 10*3/uL (ref 0.0–0.1)
EOS%: 2.4 % (ref 0.0–7.0)
Eosinophils Absolute: 0.1 10*3/uL (ref 0.0–0.5)
HEMATOCRIT: 36.8 % — AB (ref 38.4–49.9)
HGB: 11.9 g/dL — ABNORMAL LOW (ref 13.0–17.1)
LYMPH#: 0.3 10*3/uL — AB (ref 0.9–3.3)
LYMPH%: 16.6 % (ref 14.0–49.0)
MCH: 28.6 pg (ref 27.2–33.4)
MCHC: 32.4 g/dL (ref 32.0–36.0)
MCV: 88.2 fL (ref 79.3–98.0)
MONO#: 0.7 10*3/uL (ref 0.1–0.9)
MONO%: 35.5 % — ABNORMAL HIGH (ref 0.0–14.0)
NEUT#: 0.9 10*3/uL — ABNORMAL LOW (ref 1.5–6.5)
NEUT%: 43.9 % (ref 39.0–75.0)
Platelets: 201 10*3/uL (ref 140–400)
RBC: 4.17 10*6/uL — ABNORMAL LOW (ref 4.20–5.82)
RDW: 19.5 % — ABNORMAL HIGH (ref 11.0–14.6)
WBC: 2.1 10*3/uL — ABNORMAL LOW (ref 4.0–10.3)

## 2015-05-06 NOTE — Progress Notes (Signed)
  Radiation Oncology         [phone]     Name: Troy Barrera MRN: [id number]   Date: 05/06/2015  DOB: 26-Feb-1956   Weekly Radiation Therapy Management    ICD-9-CM ICD-10-CM   1. Malignant neoplasm of stomach, unspecified location ([place]) 151.9 C16.9     Current Dose: 50.4 Gy  Planned Dose: 50.4 Gy  Narrative The patient presents for routine under treatment assessment.  Troy Barrera has completed external radiaiton to his stomach. He denies any pain or diarrhea. He has no other complaints at this time.  The patient is without complaint. Set-up films were reviewed. The chart was checked.  Physical Findings  height is 5\' 9"  (1.753 m) and weight is 140 lb (63.504 kg). His temperature is 98.4 F (36.9 C). His blood pressure is 127/75 and his pulse is 48. . Weight essentially stable.  No significant changes.  Impression The patient is tolerating radiation.  Plan Troy Barrera completed radiation today. He has a follow up appointment scheduled in a month with Dr. Lisbeth Renshaw. He will return to work 05/16/2015.         Sheral Apley Tammi Klippel, M.D.  This document serves as a record of services personally performed by Tyler Pita, MD. It was created on his behalf by Lendon Collar, a trained medical scribe. The creation of this record is based on the scribe's personal observations and the provider's statements to them. This document has been checked and approved by the attending provider.

## 2015-05-06 NOTE — Progress Notes (Signed)
Troy Barrera has completed XRT to his stomach.  He denies any pain nor diarrhea.  One month FU appt. Given.  He is requesting a letter to return to work on 05/16/15.

## 2015-05-06 NOTE — Progress Notes (Signed)
  Radiation Oncology         [phone] ________________________________  Name: Troy Barrera  MRN: [id number]  Date: 05/06/2015  DOB: 04-13-1956  To whom it may concern:  [name] is released to return to work on 05/16/15   Rodman Key A. Tammi Klippel, M.D.

## 2015-05-22 ENCOUNTER — Other Ambulatory Visit: Payer: Self-pay | Admitting: Oncology

## 2015-05-27 ENCOUNTER — Encounter: Payer: Self-pay | Admitting: Oncology

## 2015-05-27 ENCOUNTER — Ambulatory Visit: Payer: Managed Care, Other (non HMO) | Admitting: Oncology

## 2015-05-27 ENCOUNTER — Other Ambulatory Visit: Payer: Self-pay | Admitting: *Deleted

## 2015-05-27 ENCOUNTER — Ambulatory Visit: Payer: Managed Care, Other (non HMO)

## 2015-05-27 ENCOUNTER — Telehealth: Payer: Self-pay | Admitting: Oncology

## 2015-05-27 ENCOUNTER — Other Ambulatory Visit: Payer: Managed Care, Other (non HMO)

## 2015-05-27 DIAGNOSIS — C166 Malignant neoplasm of greater curvature of stomach, unspecified: Secondary | ICD-10-CM

## 2015-05-27 NOTE — Progress Notes (Signed)
Message was given to Troy Barrera yesterday regarding patient wanting to cancel appointment due to having new insurance and wanted a return call. Darlena called patient back and left message to have him call me due to her not being here today. Patient called me this morning stating that he has questions about his new insurance and how much his treatment will cost him with his new insurance before he starts treatment again. He understands that his copay is now $50 for specialist and is ok with that but worried about the cost of treatment itself. He states he called his HR department at his job and they told him to call us to call [employer] to find out his cost. He states he was going to call [place] office to cancel his appointment til next week to give me time to get the information. I asked if he called [employer] and he said no. I asked if he had a benefit book or paperwork other than the card he said no. I explained to him that if he cancels his appointment today, then the cost of his treatment still will not be available until after he has treatment and it has been billed through insurance and then the remaining to himself. He said ok but was still saying he was going to cancel. He also states that he had a cancer policy through his employer which is not being offered this year. He needed itemized statements from all of his treatments. I gave him the number to billing at [phone] to have that information sent to him or to the cancer policy. He verbalized understanding. I also advised him that if there is any copay assistance available for his treatment, I would let him know and apply on his behalf. I told him I would call him back later this afternoon if he didn't come in for his appointment once I was able to obtain benefits via telephone and have a printout. I called [employer] with the id number he gave me on his card to obtain benefit information. I am waiting on the fax. I will then call him back. In the meanwhile, I will  be checking for copay assistance possibilities.

## 2015-05-27 NOTE — Progress Notes (Signed)
After receiving benefits and eligibility from [facility], asked for RX information, was given the number to [place]. Called [employer] to get information on RX drug coverage. Called patient back to give him detailed information on his 80/20 plan and how it works. Asked patient if he received RX card and he said he did. Asked patient for gross income and would need W-2 form for 2016. Patient quoted his gross and he is over-income for copay assistance. Advised patient of this and if any  assistance comes available that he may qualify for I will contact him. Patient verbalized understanding and states he will now call scheduling and get on the schedule for next week to proceed with his treatment. Patient thanked me for my research,time,and effort.

## 2015-05-27 NOTE — Telephone Encounter (Signed)
pt cld to CX appt and left vopicemail to CX--wanted to spk w/nurse to r/s inf-trans to [employer]

## 2015-05-28 ENCOUNTER — Telehealth: Payer: Self-pay | Admitting: Nurse Practitioner

## 2015-05-28 NOTE — Telephone Encounter (Signed)
Aware of added appts on 1/27

## 2015-06-03 ENCOUNTER — Telehealth: Payer: Self-pay | Admitting: Nurse Practitioner

## 2015-06-03 ENCOUNTER — Telehealth: Payer: Self-pay | Admitting: *Deleted

## 2015-06-03 ENCOUNTER — Ambulatory Visit (HOSPITAL_BASED_OUTPATIENT_CLINIC_OR_DEPARTMENT_OTHER): Payer: 59 | Admitting: Nurse Practitioner

## 2015-06-03 ENCOUNTER — Ambulatory Visit (HOSPITAL_BASED_OUTPATIENT_CLINIC_OR_DEPARTMENT_OTHER): Payer: 59

## 2015-06-03 ENCOUNTER — Ambulatory Visit: Payer: 59 | Admitting: Nutrition

## 2015-06-03 ENCOUNTER — Encounter: Payer: Self-pay | Admitting: *Deleted

## 2015-06-03 ENCOUNTER — Other Ambulatory Visit: Payer: Managed Care, Other (non HMO)

## 2015-06-03 ENCOUNTER — Other Ambulatory Visit (HOSPITAL_BASED_OUTPATIENT_CLINIC_OR_DEPARTMENT_OTHER): Payer: 59

## 2015-06-03 VITALS — BP 142/83 | HR 62 | Temp 98.3°F | Resp 18 | Ht 69.0 in | Wt 135.6 lb

## 2015-06-03 DIAGNOSIS — C162 Malignant neoplasm of body of stomach: Secondary | ICD-10-CM

## 2015-06-03 DIAGNOSIS — C169 Malignant neoplasm of stomach, unspecified: Secondary | ICD-10-CM

## 2015-06-03 DIAGNOSIS — E119 Type 2 diabetes mellitus without complications: Secondary | ICD-10-CM | POA: Diagnosis not present

## 2015-06-03 DIAGNOSIS — D696 Thrombocytopenia, unspecified: Secondary | ICD-10-CM

## 2015-06-03 DIAGNOSIS — C166 Malignant neoplasm of greater curvature of stomach, unspecified: Secondary | ICD-10-CM

## 2015-06-03 DIAGNOSIS — Z5111 Encounter for antineoplastic chemotherapy: Secondary | ICD-10-CM | POA: Diagnosis not present

## 2015-06-03 LAB — CBC WITH DIFFERENTIAL/PLATELET
BASO%: 0.7 % (ref 0.0–2.0)
Basophils Absolute: 0 10*3/uL (ref 0.0–0.1)
EOS%: 0.3 % (ref 0.0–7.0)
Eosinophils Absolute: 0 10*3/uL (ref 0.0–0.5)
HEMATOCRIT: 41.2 % (ref 38.4–49.9)
HEMOGLOBIN: 13.3 g/dL (ref 13.0–17.1)
LYMPH#: 0.4 10*3/uL — AB (ref 0.9–3.3)
LYMPH%: 10 % — ABNORMAL LOW (ref 14.0–49.0)
MCH: 28.9 pg (ref 27.2–33.4)
MCHC: 32.2 g/dL (ref 32.0–36.0)
MCV: 89.5 fL (ref 79.3–98.0)
MONO#: 0.9 10*3/uL (ref 0.1–0.9)
MONO%: 21.6 % — ABNORMAL HIGH (ref 0.0–14.0)
NEUT%: 67.4 % (ref 39.0–75.0)
NEUTROS ABS: 2.7 10*3/uL (ref 1.5–6.5)
PLATELETS: 125 10*3/uL — AB (ref 140–400)
RBC: 4.6 10*6/uL (ref 4.20–5.82)
RDW: 16 % — AB (ref 11.0–14.6)
WBC: 4.1 10*3/uL (ref 4.0–10.3)

## 2015-06-03 LAB — COMPREHENSIVE METABOLIC PANEL
ALT: 27 U/L (ref 0–55)
ANION GAP: 7 meq/L (ref 3–11)
AST: 22 U/L (ref 5–34)
Albumin: 4 g/dL (ref 3.5–5.0)
Alkaline Phosphatase: 100 U/L (ref 40–150)
BILIRUBIN TOTAL: 1 mg/dL (ref 0.20–1.20)
BUN: 9.5 mg/dL (ref 7.0–26.0)
CO2: 26 meq/L (ref 22–29)
CREATININE: 1.2 mg/dL (ref 0.7–1.3)
Calcium: 9.4 mg/dL (ref 8.4–10.4)
Chloride: 106 mEq/L (ref 98–109)
EGFR: 78 mL/min/{1.73_m2} — ABNORMAL LOW (ref >90)
Glucose: 172 mg/dl — ABNORMAL HIGH (ref 70–140)
Potassium: 3.9 mEq/L (ref 3.5–5.1)
Sodium: 138 mEq/L (ref 136–145)
TOTAL PROTEIN: 7.8 g/dL (ref 6.4–8.3)

## 2015-06-03 MED ORDER — LEUCOVORIN CALCIUM INJECTION 100 MG
20.0000 mg/m2 | Freq: Once | INTRAMUSCULAR | Status: AC
  Administered 2015-06-03: 36 mg via INTRAVENOUS
  Filled 2015-06-03: qty 1.8

## 2015-06-03 MED ORDER — FLUOROURACIL CHEMO INJECTION 2.5 GM/50ML
400.0000 mg/m2 | Freq: Once | INTRAVENOUS | Status: AC
  Administered 2015-06-03: 700 mg via INTRAVENOUS
  Filled 2015-06-03: qty 14

## 2015-06-03 MED ORDER — PROCHLORPERAZINE MALEATE 10 MG PO TABS
10.0000 mg | ORAL_TABLET | Freq: Once | ORAL | Status: AC
  Administered 2015-06-03: 10 mg via ORAL

## 2015-06-03 MED ORDER — SODIUM CHLORIDE 0.9 % IV SOLN
Freq: Once | INTRAVENOUS | Status: AC
  Administered 2015-06-03: 13:00:00 via INTRAVENOUS

## 2015-06-03 MED ORDER — PROCHLORPERAZINE MALEATE 10 MG PO TABS
ORAL_TABLET | ORAL | Status: AC
  Filled 2015-06-03: qty 1

## 2015-06-03 NOTE — Telephone Encounter (Signed)
Per staff message and POF I have scheduled appts. Advised scheduler of appts. JMW  

## 2015-06-03 NOTE — Telephone Encounter (Signed)
per pof to sch pt appt-sent MW email to sch trmt-gave pt copy of avs °

## 2015-06-03 NOTE — Telephone Encounter (Signed)
Per note from chemo note I have moved appts up on 2/3. Patient called and given new times

## 2015-06-03 NOTE — Patient Instructions (Signed)
[facility] Discharge Instructions for Patients Receiving Chemotherapy  Today you received the following chemotherapy agents;  Leucovorin and Fluorouracil.   To help prevent nausea and vomiting after your treatment, we encourage you to take your nausea medication as directed.    If you develop nausea and vomiting that is not controlled by your nausea medication, call the clinic.   BELOW ARE SYMPTOMS THAT SHOULD BE REPORTED IMMEDIATELY:  *FEVER GREATER THAN 100.5 F  *CHILLS WITH OR WITHOUT FEVER  NAUSEA AND VOMITING THAT IS NOT CONTROLLED WITH YOUR NAUSEA MEDICATION  *UNUSUAL SHORTNESS OF BREATH  *UNUSUAL BRUISING OR BLEEDING  TENDERNESS IN MOUTH AND THROAT WITH OR WITHOUT PRESENCE OF ULCERS  *URINARY PROBLEMS  *BOWEL PROBLEMS  UNUSUAL RASH Items with * indicate a potential emergency and should be followed up as soon as possible.  Feel free to call the clinic you have any questions or concerns. The clinic phone number is [phone].  Please show the CHEMO ALERT CARD at check-in to the Emergency Department and triage nurse.   

## 2015-06-03 NOTE — Telephone Encounter (Signed)
per pof to sch pt appt-gave pt copy of avs °

## 2015-06-03 NOTE — Progress Notes (Signed)
Oncology Nurse Navigator Documentation  Oncology Nurse Navigator Flowsheets 06/03/2015  Navigator Location [facility]-Med Onc  Navigator Encounter Type Treatment/3 month F/U  Patient Visit Type MedOnc  Treatment Phase Treatment # 1 5FU/LV (s/p RT)  Barriers/Navigation Needs No Questions;No Needs;No barriers at this time  Education -  Interventions None required  Referrals -  Coordination of Care -  Education Method -  Support Groups/Services -  Acuity Level 1  Time Spent with Patient 15

## 2015-06-03 NOTE — Progress Notes (Signed)
Nutrition follow-up completed with patient during infusion for gastric cancer. Patient's weight decreased and documented as 135 pounds today decreased from 140 pounds December 30. Patient attributes weight loss to the fact that he has returned to work and is more active. States he drinks 2 bottles of Ensure Plus a day and prefers vanilla and strawberry flavors. Patient denies nutrition impact symptoms.  Nutrition diagnosis: Food and nutrition related knowledge deficit continues. Unintended weight loss related to inadequate oral intake as evidenced by approximately 5 pound weight loss over 3 weeks.  Intervention: Educated patient to continue strategies for increased calories and protein. Recommended patient increase Ensure Plus 3 times a day Provided additional samples and coupons. Questions were answered.  Teach back method used.  Monitoring, evaluation, goals: Patient will work to increase overall oral intake to minimize further weight loss.  Next visit: Friday, February 10, during infusion.  **Disclaimer: This note was dictated with voice recognition software. Similar sounding words can inadvertently be transcribed and this note may contain transcription errors which may not have been corrected upon publication of note.**

## 2015-06-03 NOTE — Progress Notes (Addendum)
  [place] OFFICE PROGRESS NOTE   Diagnosis:   Gastric cancer  INTERVAL HISTORY:   Troy Barrera returns as scheduled. He feels well. He has returned to work. He is more active. He continues to have a good appetite. He has lost some weight. No nausea or vomiting. No mouth sores. No diarrhea. No pain. He felt "chilled" this morning. No fever. Midline abdominal incision feels "tight".  Objective:  Vital signs in last 24 hours:  Blood pressure 142/83, pulse 62, temperature 98.3 F (36.8 C), temperature source Oral, resp. rate 18, height _0  (1.753 m), weight 135 lb 9.6 oz (61.508 kg), SpO2 100 %.    HEENT:  No thrush or ulcers. Resp:  Lungs clear bilaterally. Cardio:  Regular rate and rhythm. GI:  Abdomen soft and nontender. No hepatomegaly. Vascular:  No leg edema. Skin:  Palms with hyperpigmentation.    Lab Results:  Lab Results  Component Value Date   WBC 4.1 06/03/2015   HGB 13.3 06/03/2015   HCT 41.2 06/03/2015   MCV 89.5 06/03/2015   PLT 125* 06/03/2015   NEUTROABS 2.7 06/03/2015    Imaging:  No results found.  Medications: I have reviewed the patient's current medications.  Assessment/Plan: 1. 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 Diabetes 3. Mild neutropenia. Question benign normal variant.   Disposition: Troy Barrera appears stable. Plan to proceed with the first of 2 cycles of weekly 5-FU/leucovorin 4 today as scheduled. He will return for a follow-up visit in 3 weeks. He will contact the office in the interim with any  problems.  Patient seen with Dr. Benay Spice.  Troy Barrera ANP/GNP-BC   06/03/2015  10:46 AM  This was a shared visit with Troy Barrera. We discussed the adjuvant treatment plan with Troy Barrera and his daughter. He will resume adjuvant 5-FU/leucovorin today.  Troy Barrera, M.D.

## 2015-06-04 ENCOUNTER — Other Ambulatory Visit: Payer: Self-pay | Admitting: Oncology

## 2015-06-10 ENCOUNTER — Ambulatory Visit (HOSPITAL_BASED_OUTPATIENT_CLINIC_OR_DEPARTMENT_OTHER): Payer: 59

## 2015-06-10 ENCOUNTER — Other Ambulatory Visit (HOSPITAL_BASED_OUTPATIENT_CLINIC_OR_DEPARTMENT_OTHER): Payer: 59

## 2015-06-10 VITALS — BP 112/70 | HR 49 | Temp 98.5°F | Resp 17

## 2015-06-10 DIAGNOSIS — C166 Malignant neoplasm of greater curvature of stomach, unspecified: Secondary | ICD-10-CM

## 2015-06-10 DIAGNOSIS — Z5111 Encounter for antineoplastic chemotherapy: Secondary | ICD-10-CM | POA: Diagnosis not present

## 2015-06-10 DIAGNOSIS — C169 Malignant neoplasm of stomach, unspecified: Secondary | ICD-10-CM

## 2015-06-10 DIAGNOSIS — C162 Malignant neoplasm of body of stomach: Secondary | ICD-10-CM

## 2015-06-10 LAB — CBC WITH DIFFERENTIAL/PLATELET
BASO%: 0.8 % (ref 0.0–2.0)
BASOS ABS: 0 10*3/uL (ref 0.0–0.1)
EOS ABS: 0 10*3/uL (ref 0.0–0.5)
EOS%: 1.1 % (ref 0.0–7.0)
HEMATOCRIT: 36 % — AB (ref 38.4–49.9)
HEMOGLOBIN: 12.6 g/dL — AB (ref 13.0–17.1)
LYMPH#: 0.5 10*3/uL — AB (ref 0.9–3.3)
LYMPH%: 13.7 % — ABNORMAL LOW (ref 14.0–49.0)
MCH: 30.2 pg (ref 27.2–33.4)
MCHC: 35 g/dL (ref 32.0–36.0)
MCV: 86.3 fL (ref 79.3–98.0)
MONO#: 0.8 10*3/uL (ref 0.1–0.9)
MONO%: 21.8 % — AB (ref 0.0–14.0)
NEUT#: 2.3 10*3/uL (ref 1.5–6.5)
NEUT%: 62.6 % (ref 39.0–75.0)
NRBC: 0 % (ref 0–0)
PLATELETS: 129 10*3/uL — AB (ref 140–400)
RBC: 4.17 10*6/uL — ABNORMAL LOW (ref 4.20–5.82)
RDW: 14 % (ref 11.0–14.6)
WBC: 3.7 10*3/uL — ABNORMAL LOW (ref 4.0–10.3)

## 2015-06-10 LAB — COMPREHENSIVE METABOLIC PANEL
ALBUMIN: 3.9 g/dL (ref 3.5–5.0)
ALT: 29 U/L (ref 0–55)
ANION GAP: 13 meq/L — AB (ref 3–11)
AST: 26 U/L (ref 5–34)
Alkaline Phosphatase: 118 U/L (ref 40–150)
BUN: 12.8 mg/dL (ref 7.0–26.0)
CALCIUM: 9.2 mg/dL (ref 8.4–10.4)
CHLORIDE: 106 meq/L (ref 98–109)
CO2: 21 meq/L — AB (ref 22–29)
Creatinine: 1.1 mg/dL (ref 0.7–1.3)
EGFR: 89 mL/min/{1.73_m2} — AB (ref >90)
GLUCOSE: 77 mg/dL (ref 70–140)
Potassium: 3.5 mEq/L (ref 3.5–5.1)
Sodium: 140 mEq/L (ref 136–145)
TOTAL PROTEIN: 7.6 g/dL (ref 6.4–8.3)
Total Bilirubin: 0.61 mg/dL (ref 0.20–1.20)

## 2015-06-10 MED ORDER — PROCHLORPERAZINE MALEATE 10 MG PO TABS
10.0000 mg | ORAL_TABLET | Freq: Once | ORAL | Status: AC
  Administered 2015-06-10: 10 mg via ORAL

## 2015-06-10 MED ORDER — FLUOROURACIL CHEMO INJECTION 2.5 GM/50ML
400.0000 mg/m2 | Freq: Once | INTRAVENOUS | Status: AC
  Administered 2015-06-10: 700 mg via INTRAVENOUS
  Filled 2015-06-10: qty 14

## 2015-06-10 MED ORDER — PROCHLORPERAZINE MALEATE 10 MG PO TABS
ORAL_TABLET | ORAL | Status: AC
  Filled 2015-06-10: qty 1

## 2015-06-10 MED ORDER — SODIUM CHLORIDE 0.9 % IV SOLN
Freq: Once | INTRAVENOUS | Status: AC
  Administered 2015-06-10: 09:00:00 via INTRAVENOUS

## 2015-06-10 MED ORDER — LEUCOVORIN CALCIUM INJECTION 100 MG
20.0000 mg/m2 | Freq: Once | INTRAMUSCULAR | Status: AC
Start: 2015-06-10 — End: 2015-06-10
  Administered 2015-06-10: 36 mg via INTRAVENOUS
  Filled 2015-06-10: qty 1.8

## 2015-06-10 NOTE — Patient Instructions (Signed)
[facility] Discharge Instructions for Patients Receiving Chemotherapy  Today you received the following chemotherapy agents: Leucovorin and 5FU.  To help prevent nausea and vomiting after your treatment, we encourage you to take your nausea medication: Zofran 8 mg every 8 hours as needed.   If you develop nausea and vomiting that is not controlled by your nausea medication, call the clinic.   BELOW ARE SYMPTOMS THAT SHOULD BE REPORTED IMMEDIATELY:  *FEVER GREATER THAN 100.5 F  *CHILLS WITH OR WITHOUT FEVER  NAUSEA AND VOMITING THAT IS NOT CONTROLLED WITH YOUR NAUSEA MEDICATION  *UNUSUAL SHORTNESS OF BREATH  *UNUSUAL BRUISING OR BLEEDING  TENDERNESS IN MOUTH AND THROAT WITH OR WITHOUT PRESENCE OF ULCERS  *URINARY PROBLEMS  *BOWEL PROBLEMS  UNUSUAL RASH Items with * indicate a potential emergency and should be followed up as soon as possible.  Feel free to call the clinic you have any questions or concerns. The clinic phone number is [phone].  Please show the [place] at check-in to the Emergency Department and triage nurse.

## 2015-06-12 ENCOUNTER — Other Ambulatory Visit: Payer: Self-pay | Admitting: Oncology

## 2015-06-16 ENCOUNTER — Other Ambulatory Visit: Payer: Self-pay | Admitting: *Deleted

## 2015-06-16 DIAGNOSIS — C166 Malignant neoplasm of greater curvature of stomach, unspecified: Secondary | ICD-10-CM

## 2015-06-16 NOTE — Progress Notes (Signed)
  Radiation Oncology         [phone] ________________________________  Name: Troy Barrera MRN: [id number]  Date: 05/06/2015  DOB: [birth year]  End of Treatment Note  Diagnosis:    Gastric cancer s/p subtotal gastrectomy 01/28/15   Staging form: Stomach, AJCC 7th Edition     Pathologic: Stage IIIA (T2, N3a, cM0) - Signed by Ladell Pier, MD on 02/11/2015       Indication for treatment::  curative       Radiation treatment dates:   03/23/2015 through 05/06/2015  Site/dose:   The patient was treated to the abdomen using a IMRT  technique . The patient initially was treated to 45 gray at 1.8 gray per fraction. He then received a 5.4 gray boost for a final dose of 50.4 gray. Concurrent chemotherapy was given.   Narrative: The patient tolerated radiation treatment relatively well.   GI symptoms were managed symptomatically during his course of radiation treatment.  Plan: The patient has completed radiation treatment. The patient will return to radiation oncology clinic for routine followup in one month. I advised the patient to call or return sooner if they have any questions or concerns related to their recovery or treatment. ________________________________  Jodelle Gross, M.D., Ph.D.

## 2015-06-17 ENCOUNTER — Other Ambulatory Visit (HOSPITAL_BASED_OUTPATIENT_CLINIC_OR_DEPARTMENT_OTHER): Payer: 59

## 2015-06-17 ENCOUNTER — Encounter: Payer: Self-pay | Admitting: Radiation Oncology

## 2015-06-17 ENCOUNTER — Ambulatory Visit
Admission: RE | Admit: 2015-06-17 | Discharge: 2015-06-17 | Disposition: A | Discharge status: Home / Self Care | Payer: 59 | Source: Ambulatory Visit | Attending: Radiation Oncology | Admitting: Radiation Oncology

## 2015-06-17 ENCOUNTER — Ambulatory Visit: Payer: 59 | Admitting: Nutrition

## 2015-06-17 ENCOUNTER — Ambulatory Visit (HOSPITAL_BASED_OUTPATIENT_CLINIC_OR_DEPARTMENT_OTHER): Payer: 59

## 2015-06-17 VITALS — BP 140/87 | HR 50 | Temp 98.7°F | Resp 20 | Ht 69.0 in | Wt 135.5 lb

## 2015-06-17 DIAGNOSIS — Z5111 Encounter for antineoplastic chemotherapy: Secondary | ICD-10-CM

## 2015-06-17 DIAGNOSIS — C162 Malignant neoplasm of body of stomach: Secondary | ICD-10-CM

## 2015-06-17 DIAGNOSIS — C169 Malignant neoplasm of stomach, unspecified: Secondary | ICD-10-CM | POA: Diagnosis not present

## 2015-06-17 DIAGNOSIS — C166 Malignant neoplasm of greater curvature of stomach, unspecified: Secondary | ICD-10-CM

## 2015-06-17 LAB — CBC WITH DIFFERENTIAL/PLATELET
BASO%: 1.2 % (ref 0.0–2.0)
Basophils Absolute: 0 10*3/uL (ref 0.0–0.1)
EOS%: 1.5 % (ref 0.0–7.0)
Eosinophils Absolute: 0 10*3/uL (ref 0.0–0.5)
HEMATOCRIT: 39.2 % (ref 38.4–49.9)
HEMOGLOBIN: 12.9 g/dL — AB (ref 13.0–17.1)
LYMPH#: 0.6 10*3/uL — AB (ref 0.9–3.3)
LYMPH%: 22.6 % (ref 14.0–49.0)
MCH: 29.2 pg (ref 27.2–33.4)
MCHC: 32.9 g/dL (ref 32.0–36.0)
MCV: 88.6 fL (ref 79.3–98.0)
MONO#: 0.6 10*3/uL (ref 0.1–0.9)
MONO%: 23.3 % — ABNORMAL HIGH (ref 0.0–14.0)
NEUT%: 51.4 % (ref 39.0–75.0)
NEUTROS ABS: 1.3 10*3/uL — AB (ref 1.5–6.5)
Platelets: 166 10*3/uL (ref 140–400)
RBC: 4.42 10*6/uL (ref 4.20–5.82)
RDW: 14.2 % (ref 11.0–14.6)
WBC: 2.6 10*3/uL — AB (ref 4.0–10.3)

## 2015-06-17 MED ORDER — PROCHLORPERAZINE MALEATE 10 MG PO TABS
ORAL_TABLET | ORAL | Status: AC
  Filled 2015-06-17: qty 1

## 2015-06-17 MED ORDER — FLUOROURACIL CHEMO INJECTION 2.5 GM/50ML
400.0000 mg/m2 | Freq: Once | INTRAVENOUS | Status: AC
  Administered 2015-06-17: 700 mg via INTRAVENOUS
  Filled 2015-06-17: qty 14

## 2015-06-17 MED ORDER — SODIUM CHLORIDE 0.9 % IV SOLN
Freq: Once | INTRAVENOUS | Status: AC
  Administered 2015-06-17: 12:00:00 via INTRAVENOUS

## 2015-06-17 MED ORDER — PROCHLORPERAZINE MALEATE 10 MG PO TABS
10.0000 mg | ORAL_TABLET | Freq: Once | ORAL | Status: AC
  Administered 2015-06-17: 10 mg via ORAL

## 2015-06-17 MED ORDER — LEUCOVORIN CALCIUM INJECTION 100 MG
20.0000 mg/m2 | Freq: Once | INTRAMUSCULAR | Status: AC
  Administered 2015-06-17: 36 mg via INTRAVENOUS
  Filled 2015-06-17: qty 1.8

## 2015-06-17 NOTE — Progress Notes (Signed)
  Radiation Oncology         [phone] ________________________________  Name: Troy Barrera MRN: [id number]  Date: 06/17/2015  DOB: [birth year]  Follow-Up Visit Note  CC: Deloria Lair, MD  Ladell Pier, MD  Diagnosis:      ICD-9-CM ICD-10-CM   1. 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   Follow up s/p rad txs completed 05/06/15, no abdominal pain,nausea or gas, c/o constipation since starting his chemotherapy new treatment,  regimen, and wakes up weak  and chills labs and infusin later today will call to see if he can be sen earlier in med onc infusion, appetite still good, taste buds aren't the same stated BP 140/87 mmHg  Pulse 50  Temp(Src) 98.7 F (37.1 C) (Oral)  Resp 20  Ht 5\' 9"  (1.753 m)  Wt 135 lb 8 oz (61.462 kg)  BMI 20.00 kg/m2  SpO2 100%  Wt Readings from Last 3 Encounters:  06/17/15 135 lb 8 oz (61.462 kg)  06/03/15 135 lb 9.6 oz (61.508 kg)  05/06/15 140 lb (63.504 kg)                                 ALLERGIES:  has No Known Allergies.  Meds: Current Outpatient Prescriptions  Medication Sig Dispense Refill  . ENSURE PLUS (ENSURE PLUS) LIQD Take 237 mLs by mouth 2 (two) times daily between meals.    Marland Kitchen glipiZIDE (GLUCOTROL XL) 5 MG 24 hr tablet Reported on 06/17/2015  5  . Multiple Vitamin (MULTIVITAMIN) tablet Take 1 tablet by mouth daily. megamend    . ondansetron (ZOFRAN) 8 MG tablet Take 1 tablet (8 mg total) by mouth every 8 (eight) hours as needed for nausea or vomiting. 30 tablet 1   No current facility-administered medications for this encounter.    Physical Findings: The patient is in no acute distress. Patient is alert and oriented.  height is 5\' 9"  (1.753 m) and weight is 135 lb 8 oz (61.462 kg). His oral temperature is 98.7 F (37.1 C). His blood pressure is 140/87  and his pulse is 50. His respiration is 20 and oxygen saturation is 100%. .     Lab Findings: Lab Results  Component Value Date   WBC 3.7* 06/10/2015   HGB 12.6* 06/10/2015   HCT 36.0* 06/10/2015   MCV 86.3 06/10/2015   PLT 129* 06/10/2015     Radiographic Findings: No results found.  Impression:    The patient clinically is doing fairly well although he feels that his recent chemotherapy has been more difficult than his prior chemotherapy. No major problems with this however. He is continuing with additional chemotherapy today. No problems with nausea, diarrhea.  Plan:  The patient will follow-up in our clinic in 6 months for ongoing follow-up.   Jodelle Gross, M.D., Ph.D.

## 2015-06-17 NOTE — Progress Notes (Signed)
Follow up s/p rad txs completed 05/06/15, no abdominal pain,nausea or gas, c/o constipation since starting his chemotherapy new treatment,  regimen, and wakes up weak  and chills labs and infusin later today will call to see if he can be sen earlier in med onc infusion, appetite still good, taste buds aren't the same stated BP 140/87 mmHg  Pulse 50  Temp(Src) 98.7 F (37.1 C) (Oral)  Resp 20  Ht 5\' 9"  (1.753 m)  Wt 135 lb 8 oz (61.462 kg)  BMI 20.00 kg/m2  SpO2 100%  Wt Readings from Last 3 Encounters:  06/17/15 135 lb 8 oz (61.462 kg)  06/03/15 135 lb 9.6 oz (61.508 kg)  05/06/15 140 lb (63.504 kg)

## 2015-06-17 NOTE — Progress Notes (Signed)
OK to treat with ANC of 1.3 per Dr. Sherrill  

## 2015-06-17 NOTE — Progress Notes (Signed)
Nutrition follow-up completed with patient during infusion for gastric cancer. Weight is stable and documented as 135.5 pounds. Patient continues to be active. Reports he is drinking 2 bottles of strawberry Ensure Plus but prefers strawberry ensure Enlive  Nutrition diagnosis: Food and nutrition related knowledge deficit and unintended weight loss improved.  Intervention: Educated patient to continue increased calories and protein with ensure enlive BID. Provided additional samples and coupons. Teach back method used.  Monitoring, evaluation, goals: Patient will continue to try to consume small frequent meals and snacks with ensure Enlive twice a day  Next visit: To be scheduled as needed  **Disclaimer: This note was dictated with voice recognition software. Similar sounding words can inadvertently be transcribed and this note may contain transcription errors which may not have been corrected upon publication of note.**

## 2015-06-17 NOTE — Patient Instructions (Signed)
[place] Discharge Instructions for Patients Receiving Chemotherapy  Today you received the following chemotherapy agents: Leucovorin and 5FU.  To help prevent nausea and vomiting after your treatment, we encourage you to take your nausea medication: Zofran 4 mg every6 hours as needed.   If you develop nausea and vomiting that is not controlled by your nausea medication, call the clinic.   BELOW ARE SYMPTOMS THAT SHOULD BE REPORTED IMMEDIATELY:  *FEVER GREATER THAN 100.5 F  *CHILLS WITH OR WITHOUT FEVER  NAUSEA AND VOMITING THAT IS NOT CONTROLLED WITH YOUR NAUSEA MEDICATION  *UNUSUAL SHORTNESS OF BREATH  *UNUSUAL BRUISING OR BLEEDING  TENDERNESS IN MOUTH AND THROAT WITH OR WITHOUT PRESENCE OF ULCERS  *URINARY PROBLEMS  *BOWEL PROBLEMS  UNUSUAL RASH Items with * indicate a potential emergency and should be followed up as soon as possible.  Feel free to call the clinic you have any questions or concerns. The clinic phone number is [phone].  Please show the [place] at check-in to the Emergency Department and triage nurse.

## 2015-06-23 ENCOUNTER — Other Ambulatory Visit: Payer: Self-pay | Admitting: *Deleted

## 2015-06-23 DIAGNOSIS — C169 Malignant neoplasm of stomach, unspecified: Secondary | ICD-10-CM

## 2015-06-24 ENCOUNTER — Other Ambulatory Visit (HOSPITAL_BASED_OUTPATIENT_CLINIC_OR_DEPARTMENT_OTHER): Payer: 59

## 2015-06-24 ENCOUNTER — Telehealth: Payer: Self-pay | Admitting: Oncology

## 2015-06-24 ENCOUNTER — Ambulatory Visit (HOSPITAL_BASED_OUTPATIENT_CLINIC_OR_DEPARTMENT_OTHER): Payer: 59 | Admitting: Oncology

## 2015-06-24 ENCOUNTER — Ambulatory Visit (HOSPITAL_BASED_OUTPATIENT_CLINIC_OR_DEPARTMENT_OTHER): Payer: 59

## 2015-06-24 ENCOUNTER — Telehealth: Payer: Self-pay | Admitting: *Deleted

## 2015-06-24 VITALS — BP 99/59 | HR 51 | Temp 98.4°F | Resp 16

## 2015-06-24 VITALS — BP 112/82 | HR 54 | Temp 97.5°F | Resp 18 | Ht 69.0 in | Wt 134.9 lb

## 2015-06-24 DIAGNOSIS — Z5111 Encounter for antineoplastic chemotherapy: Secondary | ICD-10-CM

## 2015-06-24 DIAGNOSIS — D6959 Other secondary thrombocytopenia: Secondary | ICD-10-CM

## 2015-06-24 DIAGNOSIS — C169 Malignant neoplasm of stomach, unspecified: Secondary | ICD-10-CM

## 2015-06-24 DIAGNOSIS — C162 Malignant neoplasm of body of stomach: Secondary | ICD-10-CM | POA: Diagnosis not present

## 2015-06-24 LAB — COMPREHENSIVE METABOLIC PANEL
ALBUMIN: 3.7 g/dL (ref 3.5–5.0)
ALK PHOS: 120 U/L (ref 40–150)
ALT: 21 U/L (ref 0–55)
AST: 22 U/L (ref 5–34)
Anion Gap: 9 mEq/L (ref 3–11)
BILIRUBIN TOTAL: 0.87 mg/dL (ref 0.20–1.20)
BUN: 9.8 mg/dL (ref 7.0–26.0)
CALCIUM: 9.2 mg/dL (ref 8.4–10.4)
CO2: 26 mEq/L (ref 22–29)
CREATININE: 1.1 mg/dL (ref 0.7–1.3)
Chloride: 103 mEq/L (ref 98–109)
EGFR: 84 mL/min/{1.73_m2} — ABNORMAL LOW (ref >90)
GLUCOSE: 135 mg/dL (ref 70–140)
POTASSIUM: 4 meq/L (ref 3.5–5.1)
Sodium: 138 mEq/L (ref 136–145)
TOTAL PROTEIN: 7.4 g/dL (ref 6.4–8.3)

## 2015-06-24 LAB — CBC WITH DIFFERENTIAL/PLATELET
BASO%: 0.7 % (ref 0.0–2.0)
BASOS ABS: 0 10*3/uL (ref 0.0–0.1)
EOS%: 0.7 % (ref 0.0–7.0)
Eosinophils Absolute: 0 10*3/uL (ref 0.0–0.5)
HEMATOCRIT: 37.9 % — AB (ref 38.4–49.9)
HEMOGLOBIN: 12.6 g/dL — AB (ref 13.0–17.1)
LYMPH#: 0.4 10*3/uL — AB (ref 0.9–3.3)
LYMPH%: 9.8 % — ABNORMAL LOW (ref 14.0–49.0)
MCH: 29.2 pg (ref 27.2–33.4)
MCHC: 33.1 g/dL (ref 32.0–36.0)
MCV: 88.2 fL (ref 79.3–98.0)
MONO#: 0.7 10*3/uL (ref 0.1–0.9)
MONO%: 18.6 % — ABNORMAL HIGH (ref 0.0–14.0)
NEUT%: 70.2 % (ref 39.0–75.0)
NEUTROS ABS: 2.7 10*3/uL (ref 1.5–6.5)
Platelets: 108 10*3/uL — ABNORMAL LOW (ref 140–400)
RBC: 4.3 10*6/uL (ref 4.20–5.82)
RDW: 14.6 % (ref 11.0–14.6)
WBC: 3.8 10*3/uL — AB (ref 4.0–10.3)

## 2015-06-24 MED ORDER — PROCHLORPERAZINE MALEATE 10 MG PO TABS
10.0000 mg | ORAL_TABLET | Freq: Once | ORAL | Status: AC
  Administered 2015-06-24: 10 mg via ORAL

## 2015-06-24 MED ORDER — SODIUM CHLORIDE 0.9 % IV SOLN
Freq: Once | INTRAVENOUS | Status: AC
  Administered 2015-06-24: 09:00:00 via INTRAVENOUS

## 2015-06-24 MED ORDER — PROCHLORPERAZINE MALEATE 10 MG PO TABS
ORAL_TABLET | ORAL | Status: AC
  Filled 2015-06-24: qty 1

## 2015-06-24 MED ORDER — FLUOROURACIL CHEMO INJECTION 2.5 GM/50ML
400.0000 mg/m2 | Freq: Once | INTRAVENOUS | Status: AC
  Administered 2015-06-24: 700 mg via INTRAVENOUS
  Filled 2015-06-24: qty 14

## 2015-06-24 MED ORDER — LEUCOVORIN CALCIUM INJECTION 350 MG
400.0000 mg/m2 | Freq: Once | INTRAMUSCULAR | Status: AC
  Administered 2015-06-24: 712 mg via INTRAVENOUS
  Filled 2015-06-24: qty 35.6

## 2015-06-24 NOTE — Telephone Encounter (Signed)
Staff message sent to [facility] to schedule chemo, per Dr. Benay Spice okay to schedule @ 8:30 on 03/10.

## 2015-06-24 NOTE — Progress Notes (Signed)
  [place] OFFICE PROGRESS NOTE   Diagnosis: Gastric cancer  INTERVAL HISTORY:   Troy Barrera returns as scheduled. He has completed 3 consecutive weeks of 5-FU/leucovorin. No mouth sores, nausea, or diarrhea. He feels "cold "at times and reports malaise following chemotherapy. He is working full-time. Good appetite. No fever.  Objective:  Vital signs in last 24 hours:  Blood pressure 112/82, pulse 54, temperature 97.5 F (36.4 C), temperature source Oral, resp. rate 18, height _0  (1.753 m), weight 134 lb 14.4 oz (61.19 kg), SpO2 100 %.    HEENT: No thrush or ulcers Resp: Lungs clear bilaterally Cardio: Regular rate and rhythm GI: No hepatomegaly, no mass, nontender Vascular: No leg edema  Skin: Mild hyperpigmentation of the hands, no erythema     Lab Results:  Lab Results  Component Value Date   WBC 3.8* 06/24/2015   HGB 12.6* 06/24/2015   HCT 37.9* 06/24/2015   MCV 88.2 06/24/2015   PLT 108* 06/24/2015   NEUTROABS 2.7 06/24/2015     Medications: I have reviewed the patient's current medications.  Assessment/Plan: 1. 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 Diabetes 3. History of Mild neutropenia. Likely a benign normal variant. 4. Mild thrombocytopenia secondary to chemotherapy   Disposition:  Troy Barrera appears stable. He will complete the current cycle of 5-FU/leucovorin today. He will contact us for a fever or chills. He will also contact us for spontaneous bleeding or bruising.  He  will return to begin the final planned 4 week cycle of 5-FU/leucovorin on 07/08/2015.  Betsy Coder, MD  06/24/2015  8:46 AM

## 2015-06-24 NOTE — Telephone Encounter (Signed)
Per staff message and POF I have scheduled appts. Advised scheduler of appts. JMW  

## 2015-06-24 NOTE — Telephone Encounter (Signed)
Scheduled patient appt per pof.  °

## 2015-06-24 NOTE — Patient Instructions (Addendum)
[place] Discharge Instructions for Patients Receiving Chemotherapy  Today you received the following chemotherapy agents 5-FU, Leucovorin  To help prevent nausea and vomiting after your treatment, we encourage you to take your nausea medication    If you develop nausea and vomiting that is not controlled by your nausea medication, call the clinic.   BELOW ARE SYMPTOMS THAT SHOULD BE REPORTED IMMEDIATELY:  *FEVER GREATER THAN 100.5 F  *CHILLS WITH OR WITHOUT FEVER  NAUSEA AND VOMITING THAT IS NOT CONTROLLED WITH YOUR NAUSEA MEDICATION  *UNUSUAL SHORTNESS OF BREATH  *UNUSUAL BRUISING OR BLEEDING  TENDERNESS IN MOUTH AND THROAT WITH OR WITHOUT PRESENCE OF ULCERS  *URINARY PROBLEMS  *BOWEL PROBLEMS  UNUSUAL RASH Items with * indicate a potential emergency and should be followed up as soon as possible.  Feel free to call the clinic you have any questions or concerns. The clinic phone number is [phone].  Please show the [place] at check-in to the Emergency Department and triage nurse.

## 2015-07-03 ENCOUNTER — Other Ambulatory Visit: Payer: Self-pay | Admitting: Oncology

## 2015-07-08 ENCOUNTER — Ambulatory Visit (HOSPITAL_BASED_OUTPATIENT_CLINIC_OR_DEPARTMENT_OTHER): Payer: 59

## 2015-07-08 ENCOUNTER — Other Ambulatory Visit: Payer: 59

## 2015-07-08 VITALS — BP 132/80 | HR 60 | Temp 98.1°F | Resp 17

## 2015-07-08 DIAGNOSIS — Z5111 Encounter for antineoplastic chemotherapy: Secondary | ICD-10-CM

## 2015-07-08 DIAGNOSIS — C162 Malignant neoplasm of body of stomach: Secondary | ICD-10-CM

## 2015-07-08 DIAGNOSIS — C169 Malignant neoplasm of stomach, unspecified: Secondary | ICD-10-CM | POA: Diagnosis not present

## 2015-07-08 LAB — CBC WITH DIFFERENTIAL/PLATELET
BASO%: 0.9 % (ref 0.0–2.0)
Basophils Absolute: 0 10*3/uL (ref 0.0–0.1)
EOS%: 1.9 % (ref 0.0–7.0)
Eosinophils Absolute: 0.1 10*3/uL (ref 0.0–0.5)
HCT: 40.8 % (ref 38.4–49.9)
HGB: 13.3 g/dL (ref 13.0–17.1)
LYMPH#: 0.4 10*3/uL — AB (ref 0.9–3.3)
LYMPH%: 14.8 % (ref 14.0–49.0)
MCH: 29.4 pg (ref 27.2–33.4)
MCHC: 32.7 g/dL (ref 32.0–36.0)
MCV: 90.1 fL (ref 79.3–98.0)
MONO#: 0.5 10*3/uL (ref 0.1–0.9)
MONO%: 18.6 % — ABNORMAL HIGH (ref 0.0–14.0)
NEUT%: 63.8 % (ref 39.0–75.0)
NEUTROS ABS: 1.7 10*3/uL (ref 1.5–6.5)
PLATELETS: 142 10*3/uL (ref 140–400)
RBC: 4.53 10*6/uL (ref 4.20–5.82)
RDW: 16.3 % — ABNORMAL HIGH (ref 11.0–14.6)
WBC: 2.7 10*3/uL — AB (ref 4.0–10.3)

## 2015-07-08 MED ORDER — SODIUM CHLORIDE 0.9 % IV SOLN
Freq: Once | INTRAVENOUS | Status: AC
  Administered 2015-07-08: 09:00:00 via INTRAVENOUS

## 2015-07-08 MED ORDER — PROCHLORPERAZINE MALEATE 10 MG PO TABS
ORAL_TABLET | ORAL | Status: AC
  Filled 2015-07-08: qty 1

## 2015-07-08 MED ORDER — PROCHLORPERAZINE MALEATE 10 MG PO TABS
10.0000 mg | ORAL_TABLET | Freq: Once | ORAL | Status: AC
  Administered 2015-07-08: 10 mg via ORAL

## 2015-07-08 MED ORDER — SODIUM CHLORIDE 0.9 % IV SOLN
400.0000 mg/m2 | Freq: Once | INTRAVENOUS | Status: AC
  Administered 2015-07-08: 712 mg via INTRAVENOUS
  Filled 2015-07-08: qty 35.6

## 2015-07-08 MED ORDER — FLUOROURACIL CHEMO INJECTION 2.5 GM/50ML
400.0000 mg/m2 | Freq: Once | INTRAVENOUS | Status: AC
  Administered 2015-07-08: 700 mg via INTRAVENOUS
  Filled 2015-07-08: qty 14

## 2015-07-08 NOTE — Patient Instructions (Signed)
[place] Discharge Instructions for Patients Receiving Chemotherapy  Today you received the following chemotherapy agents: Leucorvorin, 5FU.  To help prevent nausea and vomiting after your treatment, we encourage you to take your nausea medication: Zofran 8 mg every 8 hours as needed.   If you develop nausea and vomiting that is not controlled by your nausea medication, call the clinic.   BELOW ARE SYMPTOMS THAT SHOULD BE REPORTED IMMEDIATELY:  *FEVER GREATER THAN 100.5 F  *CHILLS WITH OR WITHOUT FEVER  NAUSEA AND VOMITING THAT IS NOT CONTROLLED WITH YOUR NAUSEA MEDICATION  *UNUSUAL SHORTNESS OF BREATH  *UNUSUAL BRUISING OR BLEEDING  TENDERNESS IN MOUTH AND THROAT WITH OR WITHOUT PRESENCE OF ULCERS  *URINARY PROBLEMS  *BOWEL PROBLEMS  UNUSUAL RASH Items with * indicate a potential emergency and should be followed up as soon as possible.  Feel free to call the clinic you have any questions or concerns. The clinic phone number is [phone].  Please show the [place] at check-in to the Emergency Department and triage nurse.

## 2015-07-15 ENCOUNTER — Telehealth: Payer: Self-pay | Admitting: Nurse Practitioner

## 2015-07-15 ENCOUNTER — Ambulatory Visit (HOSPITAL_BASED_OUTPATIENT_CLINIC_OR_DEPARTMENT_OTHER): Payer: 59

## 2015-07-15 ENCOUNTER — Telehealth: Payer: Self-pay | Admitting: *Deleted

## 2015-07-15 ENCOUNTER — Other Ambulatory Visit (HOSPITAL_BASED_OUTPATIENT_CLINIC_OR_DEPARTMENT_OTHER): Payer: 59

## 2015-07-15 ENCOUNTER — Ambulatory Visit: Payer: 59 | Admitting: Nutrition

## 2015-07-15 ENCOUNTER — Telehealth: Payer: Self-pay | Admitting: Oncology

## 2015-07-15 ENCOUNTER — Ambulatory Visit (HOSPITAL_BASED_OUTPATIENT_CLINIC_OR_DEPARTMENT_OTHER): Payer: 59 | Admitting: Oncology

## 2015-07-15 VITALS — BP 139/72 | HR 51 | Temp 98.3°F | Resp 18 | Ht 69.0 in | Wt 132.5 lb

## 2015-07-15 DIAGNOSIS — C166 Malignant neoplasm of greater curvature of stomach, unspecified: Secondary | ICD-10-CM

## 2015-07-15 DIAGNOSIS — Z5111 Encounter for antineoplastic chemotherapy: Secondary | ICD-10-CM

## 2015-07-15 DIAGNOSIS — C162 Malignant neoplasm of body of stomach: Secondary | ICD-10-CM

## 2015-07-15 DIAGNOSIS — E119 Type 2 diabetes mellitus without complications: Secondary | ICD-10-CM

## 2015-07-15 LAB — COMPREHENSIVE METABOLIC PANEL
ALT: 19 U/L (ref 0–55)
AST: 24 U/L (ref 5–34)
Albumin: 4.1 g/dL (ref 3.5–5.0)
Alkaline Phosphatase: 128 U/L (ref 40–150)
Anion Gap: 10 mEq/L (ref 3–11)
BUN: 9.1 mg/dL (ref 7.0–26.0)
CHLORIDE: 107 meq/L (ref 98–109)
CO2: 24 meq/L (ref 22–29)
CREATININE: 1.2 mg/dL (ref 0.7–1.3)
Calcium: 9.2 mg/dL (ref 8.4–10.4)
EGFR: 79 mL/min/{1.73_m2} — ABNORMAL LOW (ref >90)
Glucose: 141 mg/dl — ABNORMAL HIGH (ref 70–140)
POTASSIUM: 3.5 meq/L (ref 3.5–5.1)
SODIUM: 140 meq/L (ref 136–145)
Total Bilirubin: 0.54 mg/dL (ref 0.20–1.20)
Total Protein: 7.6 g/dL (ref 6.4–8.3)

## 2015-07-15 LAB — CBC WITH DIFFERENTIAL/PLATELET
BASO%: 1.1 % (ref 0.0–2.0)
BASOS ABS: 0 10*3/uL (ref 0.0–0.1)
EOS%: 1.7 % (ref 0.0–7.0)
Eosinophils Absolute: 0 10*3/uL (ref 0.0–0.5)
HCT: 37.9 % — ABNORMAL LOW (ref 38.4–49.9)
HGB: 12.7 g/dL — ABNORMAL LOW (ref 13.0–17.1)
LYMPH#: 0.5 10*3/uL — AB (ref 0.9–3.3)
LYMPH%: 17.6 % (ref 14.0–49.0)
MCH: 29.9 pg (ref 27.2–33.4)
MCHC: 33.6 g/dL (ref 32.0–36.0)
MCV: 88.9 fL (ref 79.3–98.0)
MONO#: 0.7 10*3/uL (ref 0.1–0.9)
MONO%: 23.5 % — AB (ref 0.0–14.0)
NEUT#: 1.6 10*3/uL (ref 1.5–6.5)
NEUT%: 56.1 % (ref 39.0–75.0)
Platelets: 191 10*3/uL (ref 140–400)
RBC: 4.26 10*6/uL (ref 4.20–5.82)
RDW: 15.8 % — ABNORMAL HIGH (ref 11.0–14.6)
WBC: 2.8 10*3/uL — ABNORMAL LOW (ref 4.0–10.3)

## 2015-07-15 MED ORDER — FLUOROURACIL CHEMO INJECTION 2.5 GM/50ML
400.0000 mg/m2 | Freq: Once | INTRAVENOUS | Status: AC
  Administered 2015-07-15: 700 mg via INTRAVENOUS
  Filled 2015-07-15: qty 14

## 2015-07-15 MED ORDER — SODIUM CHLORIDE 0.9 % IV SOLN
400.0000 mg/m2 | Freq: Once | INTRAVENOUS | Status: AC
  Administered 2015-07-15: 712 mg via INTRAVENOUS
  Filled 2015-07-15: qty 35.6

## 2015-07-15 MED ORDER — PROCHLORPERAZINE MALEATE 10 MG PO TABS
10.0000 mg | ORAL_TABLET | Freq: Once | ORAL | Status: AC
  Administered 2015-07-15: 10 mg via ORAL

## 2015-07-15 MED ORDER — PROCHLORPERAZINE MALEATE 10 MG PO TABS
ORAL_TABLET | ORAL | Status: AC
  Filled 2015-07-15: qty 1

## 2015-07-15 MED ORDER — SODIUM CHLORIDE 0.9 % IV SOLN
Freq: Once | INTRAVENOUS | Status: AC
  Administered 2015-07-15: 10:00:00 via INTRAVENOUS

## 2015-07-15 NOTE — Telephone Encounter (Signed)
Per staff message and POF I have scheduled appts. Advised scheduler of appts. JMW  

## 2015-07-15 NOTE — Telephone Encounter (Signed)
per pof to sch pt appt*sent MW email to move trmt to coordinate w/MD appt-pt to get updated copy b4 leaving

## 2015-07-15 NOTE — Progress Notes (Signed)
  [place] OFFICE PROGRESS NOTE   Diagnosis: Gastric cancer  INTERVAL HISTORY:   [name] returns as scheduled. He began another cycle of 5-FU/leucovorin on 07/08/2015. He feels well. He is working. No nausea, mouth sores, diarrhea, or hand/foot pain.  Objective:  Vital signs in last 24 hours:  Blood pressure 139/72, pulse 51, temperature 98.3 F (36.8 C), temperature source Oral, resp. rate 18, height '5\' 9"'$  (1.753 m), weight 132 lb 8 oz (60.102 kg), SpO2 100 %.    HEENT: No thrush or ulcers Resp: Lungs clear bilaterally Cardio: Regular rate and rhythm GI: No hepatomegaly, nontender Vascular: No leg edema  Skin: Mild hyperpigmentation of the palms     Lab Results:  Lab Results  Component Value Date   WBC 2.8* 07/15/2015   HGB 12.7* 07/15/2015   HCT 37.9* 07/15/2015   MCV 88.9 07/15/2015   PLT 191 07/15/2015   NEUTROABS 1.6 07/15/2015     Medications: I have reviewed the patient's current medications.  Assessment/Plan: 1. 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 Diabetes 3. History of Mild neutropenia. Likely a benign normal variant.    Disposition:  [name] appears stable. He will complete another treatment with 5-FU/leucovorin today. The plan is to complete 2 additional weeks of 5-FU/leucovorin. He will return for an office visit prior to the final planned treatment with 5-FU/leucovorin on 07/29/2015.  Betsy Coder, MD  07/15/2015  8:43 AM

## 2015-07-15 NOTE — Patient Instructions (Signed)
[facility] Discharge Instructions for Patients Receiving Chemotherapy  Today you received the following chemotherapy agents: Leucovorin/5 FU To help prevent nausea and vomiting after your treatment, we encourage you to take your nausea medication as prescribed.   If you develop nausea and vomiting that is not controlled by your nausea medication, call the clinic.   BELOW ARE SYMPTOMS THAT SHOULD BE REPORTED IMMEDIATELY:  *FEVER GREATER THAN 100.5 F  *CHILLS WITH OR WITHOUT FEVER  NAUSEA AND VOMITING THAT IS NOT CONTROLLED WITH YOUR NAUSEA MEDICATION  *UNUSUAL SHORTNESS OF BREATH  *UNUSUAL BRUISING OR BLEEDING  TENDERNESS IN MOUTH AND THROAT WITH OR WITHOUT PRESENCE OF ULCERS  *URINARY PROBLEMS  *BOWEL PROBLEMS  UNUSUAL RASH Items with * indicate a potential emergency and should be followed up as soon as possible.  Feel free to call the clinic you have any questions or concerns. The clinic phone number is [phone].  Please show the CHEMO ALERT CARD at check-in to the Emergency Department and triage nurse.   

## 2015-07-15 NOTE — Progress Notes (Signed)
Nutrition follow-up completed with patient during chemotherapy for gastric cancer. Weight decreased and documented as 132.5 pounds March 10 from 135.5 pounds. Patient is active and has gone back to work. He denies nutrition impact symptoms. Reports he can only drink one bottle of ensure daily.  Nutrition diagnosis: Food and nutrition related knowledge deficit resolved. Unintended weight loss continues.  Intervention: Reviewed strategies for patient to add extra calories and protein to meals and snacks. Provided samples of ensure. And encouraged patient to try to increase to 2 times daily. Teach back method used.  Monitoring, evaluation, goals: Patient will work to increase calories and protein to minimize weight loss.  Next visit: Patient was encouraged to contact me with questions or concerns.  No follow-up is scheduled.  **Disclaimer: This note was dictated with voice recognition software. Similar sounding words can inadvertently be transcribed and this note may contain transcription errors which may not have been corrected upon publication of note.**

## 2015-07-22 ENCOUNTER — Ambulatory Visit (HOSPITAL_BASED_OUTPATIENT_CLINIC_OR_DEPARTMENT_OTHER): Payer: 59

## 2015-07-22 VITALS — BP 123/78 | HR 56 | Temp 97.2°F

## 2015-07-22 DIAGNOSIS — C166 Malignant neoplasm of greater curvature of stomach, unspecified: Secondary | ICD-10-CM | POA: Diagnosis not present

## 2015-07-22 DIAGNOSIS — C162 Malignant neoplasm of body of stomach: Secondary | ICD-10-CM

## 2015-07-22 DIAGNOSIS — Z5111 Encounter for antineoplastic chemotherapy: Secondary | ICD-10-CM | POA: Diagnosis not present

## 2015-07-22 MED ORDER — SODIUM CHLORIDE 0.9 % IV SOLN
Freq: Once | INTRAVENOUS | Status: AC
  Administered 2015-07-22: 09:00:00 via INTRAVENOUS

## 2015-07-22 MED ORDER — PROCHLORPERAZINE MALEATE 10 MG PO TABS
ORAL_TABLET | ORAL | Status: AC
  Filled 2015-07-22: qty 1

## 2015-07-22 MED ORDER — SODIUM CHLORIDE 0.9 % IV SOLN
400.0000 mg/m2 | Freq: Once | INTRAVENOUS | Status: AC
  Administered 2015-07-22: 712 mg via INTRAVENOUS
  Filled 2015-07-22: qty 35.6

## 2015-07-22 MED ORDER — PROCHLORPERAZINE MALEATE 10 MG PO TABS
10.0000 mg | ORAL_TABLET | Freq: Once | ORAL | Status: AC
  Administered 2015-07-22: 10 mg via ORAL

## 2015-07-22 MED ORDER — FLUOROURACIL CHEMO INJECTION 2.5 GM/50ML
400.0000 mg/m2 | Freq: Once | INTRAVENOUS | Status: AC
  Administered 2015-07-22: 700 mg via INTRAVENOUS
  Filled 2015-07-22: qty 14

## 2015-07-22 NOTE — Patient Instructions (Signed)
[facility] Discharge Instructions for Patients Receiving Chemotherapy  Today you received the following chemotherapy agents:  5FU and Leucovorin.  To help prevent nausea and vomiting after your treatment, we encourage you to take your nausea medication as prescribed.   If you develop nausea and vomiting that is not controlled by your nausea medication, call the clinic.   BELOW ARE SYMPTOMS THAT SHOULD BE REPORTED IMMEDIATELY:  *FEVER GREATER THAN 100.5 F  *CHILLS WITH OR WITHOUT FEVER  NAUSEA AND VOMITING THAT IS NOT CONTROLLED WITH YOUR NAUSEA MEDICATION  *UNUSUAL SHORTNESS OF BREATH  *UNUSUAL BRUISING OR BLEEDING  TENDERNESS IN MOUTH AND THROAT WITH OR WITHOUT PRESENCE OF ULCERS  *URINARY PROBLEMS  *BOWEL PROBLEMS  UNUSUAL RASH Items with * indicate a potential emergency and should be followed up as soon as possible.  Feel free to call the clinic you have any questions or concerns. The clinic phone number is [phone].  Please show the [place] at check-in to the Emergency Department and triage nurse.

## 2015-07-29 ENCOUNTER — Telehealth: Payer: Self-pay | Admitting: Oncology

## 2015-07-29 ENCOUNTER — Other Ambulatory Visit (HOSPITAL_BASED_OUTPATIENT_CLINIC_OR_DEPARTMENT_OTHER): Payer: 59

## 2015-07-29 ENCOUNTER — Encounter: Payer: 59 | Admitting: Nutrition

## 2015-07-29 ENCOUNTER — Ambulatory Visit (HOSPITAL_BASED_OUTPATIENT_CLINIC_OR_DEPARTMENT_OTHER): Payer: 59 | Admitting: Nurse Practitioner

## 2015-07-29 ENCOUNTER — Ambulatory Visit (HOSPITAL_BASED_OUTPATIENT_CLINIC_OR_DEPARTMENT_OTHER): Payer: 59

## 2015-07-29 VITALS — BP 140/75 | HR 53 | Temp 98.4°F | Resp 18 | Ht 69.0 in | Wt 134.0 lb

## 2015-07-29 DIAGNOSIS — C166 Malignant neoplasm of greater curvature of stomach, unspecified: Secondary | ICD-10-CM | POA: Diagnosis not present

## 2015-07-29 DIAGNOSIS — C162 Malignant neoplasm of body of stomach: Secondary | ICD-10-CM

## 2015-07-29 DIAGNOSIS — Z5111 Encounter for antineoplastic chemotherapy: Secondary | ICD-10-CM | POA: Diagnosis not present

## 2015-07-29 DIAGNOSIS — E119 Type 2 diabetes mellitus without complications: Secondary | ICD-10-CM | POA: Diagnosis not present

## 2015-07-29 LAB — CBC WITH DIFFERENTIAL/PLATELET
BASO%: 1 % (ref 0.0–2.0)
BASOS ABS: 0 10*3/uL (ref 0.0–0.1)
EOS ABS: 0.1 10*3/uL (ref 0.0–0.5)
EOS%: 2 % (ref 0.0–7.0)
HCT: 34.9 % — ABNORMAL LOW (ref 38.4–49.9)
HEMOGLOBIN: 11.4 g/dL — AB (ref 13.0–17.1)
LYMPH%: 13 % — AB (ref 14.0–49.0)
MCH: 29.6 pg (ref 27.2–33.4)
MCHC: 32.6 g/dL (ref 32.0–36.0)
MCV: 90.9 fL (ref 79.3–98.0)
MONO#: 0.6 10*3/uL (ref 0.1–0.9)
MONO%: 21 % — AB (ref 0.0–14.0)
NEUT#: 1.9 10*3/uL (ref 1.5–6.5)
NEUT%: 63 % (ref 39.0–75.0)
Platelets: 175 10*3/uL (ref 140–400)
RBC: 3.84 10*6/uL — AB (ref 4.20–5.82)
RDW: 18.1 % — ABNORMAL HIGH (ref 11.0–14.6)
WBC: 3 10*3/uL — ABNORMAL LOW (ref 4.0–10.3)
lymph#: 0.4 10*3/uL — ABNORMAL LOW (ref 0.9–3.3)

## 2015-07-29 MED ORDER — PROCHLORPERAZINE MALEATE 10 MG PO TABS
ORAL_TABLET | ORAL | Status: AC
  Filled 2015-07-29: qty 1

## 2015-07-29 MED ORDER — LEUCOVORIN CALCIUM INJECTION 350 MG
400.0000 mg/m2 | Freq: Once | INTRAMUSCULAR | Status: AC
  Administered 2015-07-29: 712 mg via INTRAVENOUS
  Filled 2015-07-29: qty 35.6

## 2015-07-29 MED ORDER — PROCHLORPERAZINE MALEATE 10 MG PO TABS
10.0000 mg | ORAL_TABLET | Freq: Once | ORAL | Status: AC
  Administered 2015-07-29: 10 mg via ORAL

## 2015-07-29 MED ORDER — SODIUM CHLORIDE 0.9 % IV SOLN
Freq: Once | INTRAVENOUS | Status: AC
  Administered 2015-07-29: 11:00:00 via INTRAVENOUS

## 2015-07-29 MED ORDER — FLUOROURACIL CHEMO INJECTION 2.5 GM/50ML
400.0000 mg/m2 | Freq: Once | INTRAVENOUS | Status: AC
  Administered 2015-07-29: 700 mg via INTRAVENOUS
  Filled 2015-07-29: qty 14

## 2015-07-29 NOTE — Progress Notes (Addendum)
  [facility] OFFICE PROGRESS NOTE   Diagnosis:  Gastric cancer  INTERVAL HISTORY:   Mr. Sevin returns as scheduled. He completed another treatment with 5-FU/leucovorin on 07/22/2015. He denies nausea/vomiting. No mouth sores. No diarrhea. No hand or foot pain or redness. He feels well.  Objective:  Vital signs in last 24 hours:  Blood pressure 140/75, pulse 53, temperature 98.4 F (36.9 C), temperature source Oral, resp. rate 18, height '5\' 9"'$  (1.753 m), weight 134 lb (60.782 kg), SpO2 100 %.    HEENT: No thrush or ulcers. Resp: Lungs clear bilaterally. Cardio: Regular rate and rhythm. GI: Abdomen soft and nontender. No hepatomegaly. Vascular: No leg edema. Skin: Palms with hyperpigmentation. No skin breakdown.    Lab Results:  Lab Results  Component Value Date   WBC 3.0* 07/29/2015   HGB 11.4* 07/29/2015   HCT 34.9* 07/29/2015   MCV 90.9 07/29/2015   PLT 175 07/29/2015   NEUTROABS 1.9 07/29/2015    Imaging:  No results found.  Medications: I have reviewed the patient's current medications.  Assessment/Plan: 1. 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 Diabetes 3. History of mild neutropenia. Likely a benign normal variant.   Disposition: Mr. Lipkin appears stable. He will complete the planned course of adjuvant 5-FU/leucovorin today. He will return for a follow-up visit in 3 months. He understands to contact the office in the interim with any problems.  Patient seen  with Dr. Benay Spice.    Ned Card ANP/GNP-BC   07/29/2015  9:54 AM  This was a shared visit with Ned Card. Mr. Sliwa will complete adjuvant therapy today.  Julieanne Manson, M.D.

## 2015-07-29 NOTE — Progress Notes (Signed)
Per Dr. Benay Spice: No CMET needed for treatment today.

## 2015-07-29 NOTE — Progress Notes (Signed)
OK to treat with CMET from 07/15/15 per Leander Rams NP

## 2015-07-29 NOTE — Patient Instructions (Signed)
[facility] Discharge Instructions for Patients Receiving Chemotherapy  Today you received the following chemotherapy agents;  Leucovorin and Fluorouracil.   To help prevent nausea and vomiting after your treatment, we encourage you to take your nausea medication as directed.    If you develop nausea and vomiting that is not controlled by your nausea medication, call the clinic.   BELOW ARE SYMPTOMS THAT SHOULD BE REPORTED IMMEDIATELY:  *FEVER GREATER THAN 100.5 F  *CHILLS WITH OR WITHOUT FEVER  NAUSEA AND VOMITING THAT IS NOT CONTROLLED WITH YOUR NAUSEA MEDICATION  *UNUSUAL SHORTNESS OF BREATH  *UNUSUAL BRUISING OR BLEEDING  TENDERNESS IN MOUTH AND THROAT WITH OR WITHOUT PRESENCE OF ULCERS  *URINARY PROBLEMS  *BOWEL PROBLEMS  UNUSUAL RASH Items with * indicate a potential emergency and should be followed up as soon as possible.  Feel free to call the clinic you have any questions or concerns. The clinic phone number is [phone].  Please show the CHEMO ALERT CARD at check-in to the Emergency Department and triage nurse.   

## 2015-07-29 NOTE — Telephone Encounter (Signed)
Gave and printed appt sched and avs for pt for June °

## 2015-10-23 ENCOUNTER — Other Ambulatory Visit: Payer: Self-pay | Admitting: Oncology

## 2015-10-26 ENCOUNTER — Telehealth: Payer: Self-pay | Admitting: Oncology

## 2015-10-26 NOTE — Telephone Encounter (Signed)
pt cld to r/s appt-on vacation will n ot be back til next week some time-gave pt 1st available-asked if he needed to come in earlier we could sch w/Lisa-pt stated wanted to see dr Lucienne Minks appt ime & date

## 2015-10-28 ENCOUNTER — Ambulatory Visit: Payer: 59 | Admitting: Oncology

## 2015-11-25 ENCOUNTER — Telehealth: Payer: Self-pay | Admitting: Oncology

## 2015-11-25 ENCOUNTER — Ambulatory Visit (HOSPITAL_BASED_OUTPATIENT_CLINIC_OR_DEPARTMENT_OTHER): Payer: 59 | Admitting: Oncology

## 2015-11-25 VITALS — BP 129/73 | HR 52 | Temp 98.3°F | Resp 16 | Ht 69.0 in | Wt 137.8 lb

## 2015-11-25 DIAGNOSIS — C166 Malignant neoplasm of greater curvature of stomach, unspecified: Secondary | ICD-10-CM | POA: Diagnosis not present

## 2015-11-25 NOTE — Progress Notes (Signed)
  [facility] OFFICE PROGRESS NOTE   Diagnosis: Gastric cancer  INTERVAL HISTORY:   [name] returns as scheduled feels well. He is working. He eats a regular diet. He is no longer using ensure. No more early satiety. No complaint.  Objective:  Vital signs in last 24 hours:  Blood pressure 129/73, pulse 52, temperature 98.3 F (36.8 C), temperature source Oral, resp. rate 16, height _0  (1.753 m), weight 137 lb 12.8 oz (62.506 kg), SpO2 100 %.    HEENT: Neck without mass Lymphatics: No cervical, supraclavicular, axillary, or inguinal nodes Resp: Lungs clear bilaterally Cardio: Regular rate and rhythm GI: No hepatomegaly, no mass, no apparent ascites Vascular: No leg edema   Lab Results:  Lab Results  Component Value Date   WBC 3.0* 07/29/2015   HGB 11.4* 07/29/2015   HCT 34.9* 07/29/2015   MCV 90.9 07/29/2015   PLT 175 07/29/2015   NEUTROABS 1.9 07/29/2015     Medications: I have reviewed the patient's current medications.  Assessment/Plan: 1. 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 Diabetes 3. History of mild neutropenia. Likely a benign normal variant.   Disposition:  Troy Barrera is in clinical remission from gastric cancer. He will return for an office visit in 3 months.  Betsy Coder, MD  11/25/2015  10:33 AM

## 2015-11-25 NOTE — Telephone Encounter (Signed)
per pof to sch pt appt-gave pt copy of avs °

## 2016-01-31 ENCOUNTER — Telehealth: Payer: Self-pay

## 2016-02-06 NOTE — Telephone Encounter (Signed)
"  I'm calling for my Dad.  Can I confirm an appointment.  Is his appointment October 26 th or the 27 th?"  Advised the October appointment has been rescheduled to November 11 th at 11:00 am.

## 2016-02-24 ENCOUNTER — Ambulatory Visit: Payer: 59 | Admitting: Oncology

## 2016-03-02 ENCOUNTER — Ambulatory Visit: Payer: 59 | Admitting: Oncology

## 2016-03-23 ENCOUNTER — Telehealth: Payer: Self-pay | Admitting: Oncology

## 2016-03-23 ENCOUNTER — Ambulatory Visit (HOSPITAL_BASED_OUTPATIENT_CLINIC_OR_DEPARTMENT_OTHER): Payer: 59 | Admitting: Oncology

## 2016-03-23 VITALS — BP 154/81 | HR 50 | Temp 98.0°F | Resp 18 | Wt 135.9 lb

## 2016-03-23 DIAGNOSIS — C166 Malignant neoplasm of greater curvature of stomach, unspecified: Secondary | ICD-10-CM

## 2016-03-23 DIAGNOSIS — E119 Type 2 diabetes mellitus without complications: Secondary | ICD-10-CM | POA: Diagnosis not present

## 2016-03-23 NOTE — Progress Notes (Signed)
  [facility] OFFICE PROGRESS NOTE   Diagnosis: Gastric cancer  INTERVAL HISTORY:   Troy Barrera returns as scheduled. He feels well. Good appetite. He is working. No nausea or pain. He reports Dr. Zella Richer has referred him for a surveillance endoscopy.  Objective:  Vital signs in last 24 hours:  Blood pressure (!) 154/81, pulse (!) 50, temperature 98 F (36.7 C), temperature source Oral, resp. rate 18, weight 135 lb 14.4 oz (61.6 kg).    HEENT: Neck without mass Lymphatics: No cervical, supraclavicular, axillary, or inguinal nodes Resp: Lungs clear bilaterally Cardio: Regular rate and rhythm GI: No mass, no hepatosplenomegaly, nontender Vascular: No leg edema  Medications: I have reviewed the patient's current medications.  Assessment/Plan: 1. 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 Staging CT scans at [facility] on 12/10/2014 with small lymph nodes along the greater curvature of the stomach, no evidence of distant metastatic disease, nonspecific small lung nodules ? Status post one cycle of weekly 5-FU/leucovorin beginning 02/18/2015 (weekly 4) ? Initiation of radiation and Xeloda 03/23/2015; completed 05/06/2015 ? Adjuvant 5-FU/leucovorin resumed 06/03/2015; completed 07/29/2015 2. Diabetes 3. History of mild neutropenia. Likely a benign normal variant.    Disposition:  Troy Barrera is in clinical remission from gastric cancer. He will return for an office visit in 4 months.  Betsy Coder, MD  03/23/2016  1:39 PM

## 2016-03-23 NOTE — Telephone Encounter (Signed)
Appointments scheduled per 03/23/16 los. AVS and appointment schedule was given to patient,per 03/23/16 los.  °

## 2016-07-20 ENCOUNTER — Telehealth: Payer: Self-pay

## 2016-07-20 ENCOUNTER — Ambulatory Visit (HOSPITAL_BASED_OUTPATIENT_CLINIC_OR_DEPARTMENT_OTHER): Payer: 59 | Admitting: Nurse Practitioner

## 2016-07-20 ENCOUNTER — Telehealth: Payer: Self-pay | Admitting: Oncology

## 2016-07-20 VITALS — BP 136/80 | HR 57 | Temp 98.8°F | Resp 17 | Wt 132.2 lb

## 2016-07-20 DIAGNOSIS — E119 Type 2 diabetes mellitus without complications: Secondary | ICD-10-CM | POA: Diagnosis not present

## 2016-07-20 DIAGNOSIS — C166 Malignant neoplasm of greater curvature of stomach, unspecified: Secondary | ICD-10-CM

## 2016-07-20 NOTE — Telephone Encounter (Signed)
Appointments scheduled per 3.16.18 LOS. Patient given AVS report and calendars with future scheduled appointments.  °

## 2016-07-20 NOTE — Telephone Encounter (Signed)
Called [facility] for endoscopy report. Spoke with Vaughan Basta in HIM, she will fax report over.   Fax recieved

## 2016-07-20 NOTE — Progress Notes (Addendum)
  [facility] OFFICE PROGRESS NOTE   Diagnosis:  Gastric cancer  INTERVAL HISTORY:   Troy Barrera returns as scheduled. He feels well. He reports a good appetite. He is exercising daily. He has a good energy level. He denies pain. He denies dysphagia. His main concern is that he is not gaining weight.  He reports undergoing a surveillance endoscopy since his last visit.  Objective:  Vital signs in last 24 hours:  Blood pressure 136/80, pulse (!) 57, temperature 98.8 F (37.1 C), temperature source Oral, resp. rate 17, weight 132 lb 3.2 oz (60 kg), SpO2 100 %.    HEENT: Neck without mass. Lymphatics: No palpable cervical, supraclavicular, axillary or inguinal lymph nodes. Resp: Lungs clear bilaterally. Cardio: Regular rate and rhythm. GI: Abdomen soft and nontender. No organomegaly. No mass. Vascular: No leg edema.    Lab Results:  Lab Results  Component Value Date   WBC 3.0 (L) 07/29/2015   HGB 11.4 (L) 07/29/2015   HCT 34.9 (L) 07/29/2015   MCV 90.9 07/29/2015   PLT 175 07/29/2015   NEUTROABS 1.9 07/29/2015    Imaging:  No results found.  Medications: I have reviewed the patient's current medications.  Assessment/Plan: 1. 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 Staging CT scans at [facility] on 12/10/2014 with small lymph nodes along the greater curvature of the stomach, no evidence of distant metastatic disease, nonspecific small lung nodules ? Status post one cycle of weekly 5-FU/leucovorin beginning 02/18/2015 (weekly 4) ? Initiation of radiation and Xeloda 03/23/2015; completed 05/06/2015 ? Adjuvant 5-FU/leucovorin resumed 06/03/2015; completed 07/29/2015 ? Upper endoscopy 05/11/2016-examined esophagus normal. Z-line regular. Evidence of subtotal gastrectomy found in the stomach. This was characterized by healthy-appearing mucosa except with areas of patchy  telangiectasias. Biopsy showed chronic focally active gastritis. 2. Diabetes 3. History of mild neutropenia. Likely a benign normal variant.   Disposition: Troy Barrera remains in clinical remission from gastric cancer. He will return for an office visit in 4 months. He will contact the office in the interim with any problems.  Patient seen with Dr. Benay Spice.    Ned Card ANP/GNP-BC   07/20/2016  10:31 AM  This was a shared visit with Ned Card. Troy Barrera remains in remission from gastric cancer. The inability to gain weight is likely related to the subtotal gastrectomy.  Julieanne Manson, M.D.

## 2016-11-15 ENCOUNTER — Other Ambulatory Visit (HOSPITAL_COMMUNITY): Payer: Self-pay | Admitting: General Surgery

## 2016-11-15 DIAGNOSIS — Z85028 Personal history of other malignant neoplasm of stomach: Secondary | ICD-10-CM

## 2016-11-16 ENCOUNTER — Telehealth: Payer: Self-pay | Admitting: Oncology

## 2016-11-16 NOTE — Telephone Encounter (Signed)
lvm to inform pt of 7/17 appt at 1215 per sch msg

## 2016-11-20 ENCOUNTER — Ambulatory Visit: Payer: 59 | Admitting: Nurse Practitioner

## 2016-11-30 ENCOUNTER — Ambulatory Visit (HOSPITAL_COMMUNITY)
Admission: RE | Admit: 2016-11-30 | Discharge: 2016-11-30 | Disposition: A | Discharge status: Home / Self Care | Payer: 59 | Source: Ambulatory Visit | Attending: General Surgery | Admitting: General Surgery

## 2016-11-30 DIAGNOSIS — Z85028 Personal history of other malignant neoplasm of stomach: Secondary | ICD-10-CM | POA: Diagnosis present

## 2016-11-30 DIAGNOSIS — I700 Atherosclerosis of aorta: Secondary | ICD-10-CM | POA: Insufficient documentation

## 2016-11-30 LAB — POCT I-STAT CREATININE: CREATININE: 1 mg/dL (ref 0.61–1.24)

## 2016-11-30 MED ORDER — IOPAMIDOL (ISOVUE-300) INJECTION 61%
100.0000 mL | Freq: Once | INTRAVENOUS | Status: AC | PRN
  Administered 2016-11-30: 100 mL via INTRAVENOUS

## 2016-12-14 ENCOUNTER — Ambulatory Visit: Payer: 59 | Admitting: Oncology

## 2017-01-05 HISTORY — PX: ESOPHAGOGASTRODUODENOSCOPY: SHX1529

## 2017-03-12 ENCOUNTER — Telehealth: Payer: Self-pay | Admitting: *Deleted

## 2017-03-12 NOTE — Telephone Encounter (Signed)
FYI Patient's daughter daughter Shavar Gorka noted on 02-11-2015 [place].  Please tell your Dad he will be scheduled for further evaluation.  Unable to reach him today due to "mailbox full"  "I will tell him and let him know to clear his voicemail.  He likes to have early Friday Morning appointments.  Call me also with appointment information.  I told him to call his PCP but he'd rather see Dr. Benay Spice.  He drinks ensure in the mornings.  Not sure why he's throwing up after he eats.   Not sure what it looks like but it happens everytime he eats.  September CT scans by Dr. Michail Sermon looked good."

## 2017-03-12 NOTE — Telephone Encounter (Signed)
"  I need to schedule an appointment with Dr. Benay Spice.  I need disability.  I work for [employer].    I am always weak.  Still vomiting.  Started back loosing weight, weight varies from 118 lbs to 121 lbs.    I can't climb on my machine at work anymore.    HR department told me I need to see a doctor.  The surgeon suggested I call Dr. Benay Spice because he cannot.   Return number [phone]."   Routing call information to collaborative nurse and provider for review.  Further patient communication through collaborative nurse.

## 2017-03-12 NOTE — Telephone Encounter (Signed)
Attempted to reach pt, no answer. Reviewed call with MD. Message to schedulers for appt with APP.

## 2017-03-13 ENCOUNTER — Telehealth: Payer: Self-pay | Admitting: Oncology

## 2017-03-13 NOTE — Telephone Encounter (Signed)
Scheduled appt per 11/6 sch message - left message with appt date and time on daughters cell.

## 2017-03-14 ENCOUNTER — Telehealth: Payer: Self-pay | Admitting: Nurse Practitioner

## 2017-03-14 ENCOUNTER — Telehealth: Payer: Self-pay

## 2017-03-14 NOTE — Telephone Encounter (Signed)
Pt called for appt time. When he was told it was on Tuesday he said he had to make it for Friday, b/c of work. He started talking about trying to get on disability. His PCP is retiring today and he cannot get in to see him. Inbasket sent for next available Friday appt with Lattie Haw.

## 2017-03-14 NOTE — Telephone Encounter (Signed)
Called patient to r/s per 11/8 sch message -  Patient is aware of updated appt date and time.

## 2017-03-19 ENCOUNTER — Ambulatory Visit: Payer: 59 | Admitting: Nurse Practitioner

## 2017-03-20 ENCOUNTER — Telehealth: Payer: Self-pay | Admitting: Oncology

## 2017-03-20 NOTE — Telephone Encounter (Signed)
Message from pt requesting to cancel 11/16 appts. "They made it mandatory that I work on Friday." Message to scheduler to call pt to reschedule.

## 2017-03-20 NOTE — Telephone Encounter (Signed)
Spoke with patient and had to cancel his appt for 11/16 - per him having to work. Patient expressed that he will call back and reschedule when he knows his schedule.

## 2017-03-22 ENCOUNTER — Ambulatory Visit: Payer: 59 | Admitting: Nurse Practitioner

## 2017-03-27 ENCOUNTER — Telehealth: Payer: Self-pay | Admitting: Oncology

## 2017-03-27 NOTE — Telephone Encounter (Signed)
Spoke with patient regarding appt that was added per 11/14 sch msg.

## 2017-04-05 ENCOUNTER — Encounter: Payer: Self-pay | Admitting: Nurse Practitioner

## 2017-04-05 ENCOUNTER — Other Ambulatory Visit: Payer: 59

## 2017-04-05 ENCOUNTER — Ambulatory Visit (HOSPITAL_BASED_OUTPATIENT_CLINIC_OR_DEPARTMENT_OTHER): Payer: 59 | Admitting: Nurse Practitioner

## 2017-04-05 ENCOUNTER — Ambulatory Visit (HOSPITAL_BASED_OUTPATIENT_CLINIC_OR_DEPARTMENT_OTHER): Payer: 59

## 2017-04-05 VITALS — BP 138/71 | HR 45 | Temp 98.2°F | Resp 18 | Ht 69.0 in | Wt 116.3 lb

## 2017-04-05 DIAGNOSIS — R634 Abnormal weight loss: Secondary | ICD-10-CM

## 2017-04-05 DIAGNOSIS — C166 Malignant neoplasm of greater curvature of stomach, unspecified: Secondary | ICD-10-CM

## 2017-04-05 DIAGNOSIS — R1110 Vomiting, unspecified: Secondary | ICD-10-CM

## 2017-04-05 DIAGNOSIS — C169 Malignant neoplasm of stomach, unspecified: Secondary | ICD-10-CM

## 2017-04-05 LAB — COMPREHENSIVE METABOLIC PANEL
ALT: 14 U/L (ref 0–55)
ANION GAP: 7 meq/L (ref 3–11)
AST: 20 U/L (ref 5–34)
Albumin: 4.1 g/dL (ref 3.5–5.0)
Alkaline Phosphatase: 85 U/L (ref 40–150)
BILIRUBIN TOTAL: 0.52 mg/dL (ref 0.20–1.20)
BUN: 10.3 mg/dL (ref 7.0–26.0)
CHLORIDE: 107 meq/L (ref 98–109)
CO2: 26 meq/L (ref 22–29)
Calcium: 8.9 mg/dL (ref 8.4–10.4)
Creatinine: 1.1 mg/dL (ref 0.7–1.3)
GLUCOSE: 87 mg/dL (ref 70–140)
Potassium: 4 mEq/L (ref 3.5–5.1)
SODIUM: 139 meq/L (ref 136–145)
TOTAL PROTEIN: 7 g/dL (ref 6.4–8.3)

## 2017-04-05 LAB — CBC WITH DIFFERENTIAL/PLATELET
BASO%: 1.1 % (ref 0.0–2.0)
Basophils Absolute: 0 10*3/uL (ref 0.0–0.1)
EOS ABS: 0 10*3/uL (ref 0.0–0.5)
EOS%: 1.1 % (ref 0.0–7.0)
HCT: 36.6 % — ABNORMAL LOW (ref 38.4–49.9)
HGB: 12.3 g/dL — ABNORMAL LOW (ref 13.0–17.1)
LYMPH%: 38.4 % (ref 14.0–49.0)
MCH: 29.3 pg (ref 27.2–33.4)
MCHC: 33.6 g/dL (ref 32.0–36.0)
MCV: 87.1 fL (ref 79.3–98.0)
MONO#: 0.3 10*3/uL (ref 0.1–0.9)
MONO%: 10.9 % (ref 0.0–14.0)
NEUT#: 1.4 10*3/uL — ABNORMAL LOW (ref 1.5–6.5)
NEUT%: 48.5 % (ref 39.0–75.0)
Platelets: 225 10*3/uL (ref 140–400)
RBC: 4.2 10*6/uL (ref 4.20–5.82)
RDW: 15.2 % — ABNORMAL HIGH (ref 11.0–14.6)
WBC: 2.8 10*3/uL — AB (ref 4.0–10.3)
lymph#: 1.1 10*3/uL (ref 0.9–3.3)

## 2017-04-05 LAB — TSH: TSH: 1.397 m(IU)/L (ref 0.320–4.118)

## 2017-04-05 LAB — CEA (IN HOUSE-CHCC): CEA (CHCC-In House): 4.39 ng/mL (ref 0.00–5.00)

## 2017-04-05 MED ORDER — PANTOPRAZOLE SODIUM 40 MG PO TBEC: 40.0000 mg | DELAYED_RELEASE_TABLET | Freq: Every day | ORAL | 0 refills | Status: DC

## 2017-04-05 MED ORDER — METOCLOPRAMIDE HCL 5 MG PO TABS: 5.0000 mg | ORAL_TABLET | Freq: Three times a day (TID) | ORAL | 0 refills | Status: DC

## 2017-04-05 NOTE — Progress Notes (Addendum)
[facility] OFFICE PROGRESS NOTE   Diagnosis: Gastric cancer  INTERVAL HISTORY:   Troy Barrera was last seen at the [facility] 07/20/2016.  He was unable to keep an appointment in July due to his work schedule.  He contacted the office earlier this month requesting an appointment to evaluate weight loss and intermittent vomiting.  For the past several months he has felt like food is remaining in his stomach after he eats.  He intermittently vomits about 30 minutes after eating.  He feels "bloated".  Bowels moving regularly.  He is losing weight.  He reports undergoing an upper endoscopy last month which was  "negative for cancer".  He was started on a trial of omeprazole with minimal improvement.  He also reports a needle sensation in his feet.  Objective:  Vital signs in last 24 hours:  Blood pressure 138/71, pulse (!) 45, temperature 98.2 F (36.8 C), temperature source Oral, resp. rate 18, height '5\' 9"'  (1.753 m), weight 116 lb 4.8 oz (52.8 kg), SpO2 100 %.    HEENT: Neck without mass. Lymphatics: No palpable cervical, supraclavicular, axillary or inguinal lymph nodes. Resp: Lungs clear bilaterally.   Cardio: Regular rate and rhythm. GI: Abdomen soft and nontender.  No hepatomegaly.  No mass. Vascular: No leg edema.   Lab Results:  Lab Results  Component Value Date   WBC 3.0 (L) 07/29/2015   HGB 11.4 (L) 07/29/2015   HCT 34.9 (L) 07/29/2015   MCV 90.9 07/29/2015   PLT 175 07/29/2015   NEUTROABS 1.9 07/29/2015    Imaging:  No results found.  Medications: I have reviewed the patient's current medications.  Assessment/Plan: 1. 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 Staging CT scans at [facility] on 12/10/2014 with small lymph nodes along the greater curvature of the stomach, no evidence of distant metastatic disease, nonspecific small lung nodules ? Status post one cycle of  weekly 5-FU/leucovorin beginning 02/18/2015 (weekly 4) ? Initiation of radiation and Xeloda 03/23/2015; completed 05/06/2015 ? Adjuvant 5-FU/leucovorin resumed 06/03/2015; completed 07/29/2015 ? Upper endoscopy 05/11/2016-examined esophagus normal. Z-line regular. Evidence of subtotal gastrectomy found in the stomach. This was characterized by healthy-appearing mucosa except with areas of patchy telangiectasias. Biopsy showed chronic focally active gastritis. ? CTs abdomen/pelvis 11/30/2016-liver normal.  Unchanged 1.5 cm cyst left hepatic lobe and 1.1 cm cyst hepatic dome.  Oral contrast material throughout the stomach, small and large bowel.  Postsurgical changes compatible with partial gastrectomy.  Anastomosis appears patent.  Appears to be some wall thickening of the small bowel at the gastroenteric anastomosis near the suture line.  Filling defect along the inferior aspect of the stomach.  No free fluid or free intraperitoneal air.  No evidence for small bowel obstruction. ? Upper endoscopy 01/29/2017-normal esophagus.  Z line regular, 42 cm from the incisors.  Patent Billroth II gastrojejunostomy with healthy appearing mucosa; congested and erythematous mucosa in the anastomosis.  Biopsy-gastric mucosa with chronic inflammation.  Negative for H. pylori organisms.  Negative for infiltrating epithelial cells. 2. Diabetes 3. History of mild neutropenia. Likely a benign normal variant.   Disposition: Troy Barrera presents with a several month history of vomiting/regurgitation with resultant weight loss.  Etiology unclear.  CT 11/30/2016 and upper endoscopy 01/29/2017 with no explanation for his symptoms.  He will return to the lab today for a CBC, chemistry panel, CEA, B12 and TSH.  He will begin Protonix 40 mg daily (he knows  to discontinue Prilosec ) and Reglan 5 mg 3 times a day before meals.  We referred him for repeat CT scans.  He will return for a follow-up visit in 1 week.  Patient seen with Dr.  Benay Barrera.  25 minutes were spent face-to-face at today's visit with the majority of that time involved in counseling/coordination of care.    Troy Barrera ANP/GNP-BC   Aug 20, 202018  10:00 AM  This was a shared visit with Troy Barrera.  Troy Barrera was interviewed and examined.  The etiology of his symptoms is unclear, but we are concerned he may have recurrent gastric cancer.  He was prescribed Reglan and Protonix.  We reviewed the potential toxicities associated with Reglan including the chance for tardive dyskinesia. He will return for an office visit in 1 week.  Troy Manson, MD

## 2017-04-06 LAB — VITAMIN B12: Vitamin B12: 274 pg/mL (ref 232–1245)

## 2017-04-08 ENCOUNTER — Ambulatory Visit (HOSPITAL_COMMUNITY)
Admission: RE | Admit: 2017-04-08 | Discharge: 2017-04-08 | Disposition: A | Discharge status: Home / Self Care | Payer: 59 | Source: Ambulatory Visit | Attending: Nurse Practitioner | Admitting: Nurse Practitioner

## 2017-04-08 DIAGNOSIS — N289 Disorder of kidney and ureter, unspecified: Secondary | ICD-10-CM | POA: Diagnosis not present

## 2017-04-08 DIAGNOSIS — R634 Abnormal weight loss: Secondary | ICD-10-CM | POA: Insufficient documentation

## 2017-04-08 DIAGNOSIS — J439 Emphysema, unspecified: Secondary | ICD-10-CM | POA: Insufficient documentation

## 2017-04-08 DIAGNOSIS — K769 Liver disease, unspecified: Secondary | ICD-10-CM | POA: Insufficient documentation

## 2017-04-08 DIAGNOSIS — C169 Malignant neoplasm of stomach, unspecified: Secondary | ICD-10-CM | POA: Diagnosis present

## 2017-04-08 DIAGNOSIS — R64 Cachexia: Secondary | ICD-10-CM | POA: Diagnosis not present

## 2017-04-08 DIAGNOSIS — I700 Atherosclerosis of aorta: Secondary | ICD-10-CM | POA: Diagnosis not present

## 2017-04-08 DIAGNOSIS — M13859 Other specified arthritis, unspecified hip: Secondary | ICD-10-CM | POA: Diagnosis not present

## 2017-04-08 DIAGNOSIS — R140 Abdominal distension (gaseous): Secondary | ICD-10-CM | POA: Diagnosis present

## 2017-04-08 MED ORDER — IOPAMIDOL (ISOVUE-300) INJECTION 61%
100.0000 mL | Freq: Once | INTRAVENOUS | Status: AC | PRN
  Administered 2017-04-08: 100 mL via INTRAVENOUS

## 2017-04-12 ENCOUNTER — Telehealth: Payer: Self-pay | Admitting: Oncology

## 2017-04-12 ENCOUNTER — Ambulatory Visit (HOSPITAL_BASED_OUTPATIENT_CLINIC_OR_DEPARTMENT_OTHER): Payer: 59 | Admitting: Oncology

## 2017-04-12 DIAGNOSIS — E119 Type 2 diabetes mellitus without complications: Secondary | ICD-10-CM

## 2017-04-12 DIAGNOSIS — C169 Malignant neoplasm of stomach, unspecified: Secondary | ICD-10-CM

## 2017-04-12 MED ORDER — PANTOPRAZOLE SODIUM 40 MG PO TBEC: 40.0000 mg | DELAYED_RELEASE_TABLET | Freq: Every day | ORAL | 5 refills | Status: DC

## 2017-04-12 MED ORDER — METOCLOPRAMIDE HCL 10 MG PO TABS: 10.0000 mg | ORAL_TABLET | Freq: Three times a day (TID) | ORAL | 5 refills | Status: DC

## 2017-04-12 NOTE — Telephone Encounter (Signed)
Scheduled appt per 12/7 los - Gave patient AVS and calender per los.  

## 2017-04-12 NOTE — Progress Notes (Signed)
Troy Barrera OFFICE PROGRESS NOTE   Diagnosis: Gastric cancer  INTERVAL HISTORY:   Troy Barrera returns as scheduled.  He reports significant improvement in postprandial bloating since beginning Protonix and metoclopramide.  No diarrhea.  No new complaint.  He feels that he is gaining weight.  Objective:  Vital signs in last 24 hours:  Blood pressure (!) 129/59, pulse 68, temperature 97.6 F (36.4 C), temperature source Oral, resp. rate 18, height '5\' 9"'  (1.753 m), weight 117 lb 3.2 oz (53.2 kg), SpO2 100 %.    Resp: Lungs clear bilaterally Cardio: Regular rate and rhythm GI: No hepatosplenomegaly, no mass, nontender Vascular: No leg edema   Lab Results:  Lab Results  Component Value Date   WBC 2.8 (L) 11-09-202018   HGB 12.3 (L) 11-09-202018   HCT 36.6 (L) 11-09-202018   MCV 87.1 11-09-202018   PLT 225 11-09-202018   NEUTROABS 1.4 (L) 11-09-202018    CMP     Component Value Date/Time   NA 139 11-09-202018 1053   K 4.0 11-09-202018 1053   CL 101 01/30/2015 0526   CO2 26 11-09-202018 1053   GLUCOSE 87 11-09-202018 1053   BUN 10.3 11-09-202018 1053   CREATININE 1.1 11-09-202018 1053   CALCIUM 8.9 11-09-202018 1053   PROT 7.0 11-09-202018 1053   ALBUMIN 4.1 11-09-202018 1053   AST 20 11-09-202018 1053   ALT 14 11-09-202018 1053   ALKPHOS 85 11-09-202018 1053   BILITOT 0.52 11-09-202018 1053   GFRNONAA >60 01/30/2015 0526   GFRAA >60 01/30/2015 0526    Lab Results  Component Value Date   CEA1 4.39 11-09-202018     Imaging:  Ct Chest W Contrast  Result Date: 04/09/2017 CLINICAL DATA:  Gastric cancer status post chemotherapy and radiation therapy, for restaging. 7 pound weight loss since July 2018. EXAM: CT CHEST, ABDOMEN, AND PELVIS WITH CONTRAST TECHNIQUE: Multidetector CT imaging of the chest, abdomen and pelvis was performed following the standard protocol during bolus administration of intravenous contrast. CONTRAST:  152m ISOVUE-300 IOPAMIDOL (ISOVUE-300) INJECTION 61%  COMPARISON:  11/30/2016 and 12/10/2014 FINDINGS: CT CHEST FINDINGS Cardiovascular: Coronary, aortic arch, and branch vessel atherosclerotic vascular disease. No cardiomegaly. Mediastinum/Nodes: Mild contrast medium in the esophageal lumen which may reflect dysmotility or reflux. No adenopathy identified. Lungs/Pleura: No worrisome pulmonary nodules. Mild centrilobular emphysema. Musculoskeletal: Lower cervical and mild thoracic spondylosis. CT ABDOMEN PELVIS FINDINGS Hepatobiliary: Stable hepatic cysts. Several of the tiny hepatic hypodensities are technically too small to characterize, although stable and statistically likely to be benign. Gallbladder unremarkable. Pancreas: Atrophic pancreas. Spleen: Unremarkable Adrenals/Urinary Tract: Indistinct fullness of both adrenal glands including a left adrenal mass measuring 1.2 cm in thickness, previously the same. Multiple small hypodense lesions in the kidneys are likely cysts. No appreciable urinary tract calculi. Stomach/Bowel: Postoperative findings along the stomach with apparent wall thickening and diverticular like outpouching near the gastrojejunostomy. The gastrojejunostomy appears patent. Very poor definition of tissue planes around the pancreaticobiliary limb intersection site. Transverse colon is collapsed, leading to a vague soft tissue density appearance rather than well-defined bowel. Prominence of stool in the descending and sigmoid colon. No dilated small bowel. Vascular/Lymphatic: Aortoiliac atherosclerotic vascular disease. Reproductive: Unremarkable Other: Severe paucity of adipose tissue in the subcutaneous tissues and abdomen, significantly worsened even from 11/30/2016. This causes blurring of adjacent structures which can lower overall diagnostic sensitivity and specificity. Musculoskeletal: Mild lower lumbar spondylosis. Moderate to prominent axial loss of articular space in both hips. IMPRESSION: 1. Wall thickening and small diverticular  type  outpouching along the gastrojejunostomy side, nonspecific. 2. No findings of new or progressive metastatic disease. 3. Progressive cachexia. This leads to poor border definitions in the abdomen which can lower diagnostic sensitivity and specificity. 4. Other imaging findings of potential clinical significance: Aortic Atherosclerosis (ICD10-I70.0) and Emphysema (ICD10-J43.9). Hepatic and renal hypodense lesions are probably cysts. Degenerative hip arthropathy. Electronically Signed   By: Van Clines M.D.   On: 04/09/2017 11:08   Ct Abdomen Pelvis W Contrast  Result Date: 04/09/2017 CLINICAL DATA:  Gastric cancer status post chemotherapy and radiation therapy, for restaging. 7 pound weight loss since July 2018. EXAM: CT CHEST, ABDOMEN, AND PELVIS WITH CONTRAST TECHNIQUE: Multidetector CT imaging of the chest, abdomen and pelvis was performed following the standard protocol during bolus administration of intravenous contrast. CONTRAST:  127m ISOVUE-300 IOPAMIDOL (ISOVUE-300) INJECTION 61% COMPARISON:  11/30/2016 and 12/10/2014 FINDINGS: CT CHEST FINDINGS Cardiovascular: Coronary, aortic arch, and branch vessel atherosclerotic vascular disease. No cardiomegaly. Mediastinum/Nodes: Mild contrast medium in the esophageal lumen which may reflect dysmotility or reflux. No adenopathy identified. Lungs/Pleura: No worrisome pulmonary nodules. Mild centrilobular emphysema. Musculoskeletal: Lower cervical and mild thoracic spondylosis. CT ABDOMEN PELVIS FINDINGS Hepatobiliary: Stable hepatic cysts. Several of the tiny hepatic hypodensities are technically too small to characterize, although stable and statistically likely to be benign. Gallbladder unremarkable. Pancreas: Atrophic pancreas. Spleen: Unremarkable Adrenals/Urinary Tract: Indistinct fullness of both adrenal glands including a left adrenal mass measuring 1.2 cm in thickness, previously the same. Multiple small hypodense lesions in the kidneys are likely  cysts. No appreciable urinary tract calculi. Stomach/Bowel: Postoperative findings along the stomach with apparent wall thickening and diverticular like outpouching near the gastrojejunostomy. The gastrojejunostomy appears patent. Very poor definition of tissue planes around the pancreaticobiliary limb intersection site. Transverse colon is collapsed, leading to a vague soft tissue density appearance rather than well-defined bowel. Prominence of stool in the descending and sigmoid colon. No dilated small bowel. Vascular/Lymphatic: Aortoiliac atherosclerotic vascular disease. Reproductive: Unremarkable Other: Severe paucity of adipose tissue in the subcutaneous tissues and abdomen, significantly worsened even from 11/30/2016. This causes blurring of adjacent structures which can lower overall diagnostic sensitivity and specificity. Musculoskeletal: Mild lower lumbar spondylosis. Moderate to prominent axial loss of articular space in both hips. IMPRESSION: 1. Wall thickening and small diverticular type outpouching along the gastrojejunostomy side, nonspecific. 2. No findings of new or progressive metastatic disease. 3. Progressive cachexia. This leads to poor border definitions in the abdomen which can lower diagnostic sensitivity and specificity. 4. Other imaging findings of potential clinical significance: Aortic Atherosclerosis (ICD10-I70.0) and Emphysema (ICD10-J43.9). Hepatic and renal hypodense lesions are probably cysts. Degenerative hip arthropathy. Electronically Signed   By: WVan ClinesM.D.   On: 04/09/2017 11:08    Medications: I have reviewed the patient's current medications.  Assessment/Plan: 1. 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 [facility] with small lymph nodes along the greater curvature of the stomach, no evidence of distant metastatic disease, nonspecific small lung  nodules ? Status post one cycle of weekly 5-FU/leucovorin beginning 02/18/2015 (weekly 4) ? Initiation of radiation and Xeloda 03/23/2015; completed 05/06/2015 ? Adjuvant 5-FU/leucovorin resumed 06/03/2015; completed 07/29/2015 ? Upper endoscopy 05/11/2016-examined esophagus normal.Z-line regular. Evidence of subtotal gastrectomy found in the stomach. This was characterized by healthy-appearing mucosa except with areas of patchy telangiectasias. Biopsy showed chronic focally active gastritis. ? CTs abdomen/pelvis 11/30/2016-liver normal.  Unchanged 1.5 cm cyst left hepatic lobe  and 1.1 cm cyst hepatic dome.  Oral contrast material throughout the stomach, small and large bowel.  Postsurgical changes compatible with partial gastrectomy.  Anastomosis appears patent.  Appears to be some wall thickening of the small bowel at the gastroenteric anastomosis near the suture line.  Filling defect along the inferior aspect of the stomach.  No free fluid or free intraperitoneal air.  No evidence for small bowel obstruction. ? Upper endoscopy 01/29/2017-normal esophagus.  Z line regular, 42 cm from the incisors.  Patent Billroth II gastrojejunostomy with healthy appearing mucosa; congested and erythematous mucosa in the anastomosis.  Biopsy-gastric mucosa with chronic inflammation.  Negative for H. pylori organisms.  Negative for infiltrating epithelial cells. ? CTs 04/09/2017-no evidence of recurrent gastric cancer 2. Diabetes 3. History of mild neutropenia. Likely a benign normal variant. 4. Postprandial bloating, weight loss summer/fall 2018-symptoms improved with metoclopramide/Protonix initiated Nov 25, 202018    Disposition:  Troy Barrera is experienced improvement in GI symptoms after initiation of Reglan/Protonix.  He requested an increase in the Reglan dose as his symptoms are not completely improved.  He will trial Reglan at a dose of 10 mg prior to meals and continue daily Protonix. The restaging CT shows no  evidence of disease progression.  Troy Barrera will return for an office visit in 1 month.  He will contact us in the interim if his symptoms are not improved.   Betsy Coder, MD  04/12/2017  8:21 AM

## 2017-04-29 ENCOUNTER — Other Ambulatory Visit: Payer: Self-pay | Admitting: *Deleted

## 2017-04-29 NOTE — Telephone Encounter (Signed)
Message from pt requesting refill of Pantoprazole. Returned call, instructed him to call pharmacy. 5 refills available.

## 2017-05-17 ENCOUNTER — Telehealth: Payer: Self-pay

## 2017-05-17 ENCOUNTER — Ambulatory Visit: Payer: 59 | Admitting: Nurse Practitioner

## 2017-05-17 NOTE — Telephone Encounter (Signed)
Patient called to inform that he will be unable to make appt due to his work schedule. Schedule message sent to reschedule patient.

## 2017-05-21 ENCOUNTER — Telehealth: Payer: Self-pay | Admitting: Oncology

## 2017-05-21 NOTE — Telephone Encounter (Signed)
Scheduled appt per 1/10 sch message - per RN  Lavella Lemons okay to schedule on any Friday - daughter aware of appt and message left on patients vmail.

## 2017-06-07 ENCOUNTER — Inpatient Hospital Stay: Payer: 59 | Attending: Oncology | Admitting: Oncology

## 2017-06-07 ENCOUNTER — Encounter: Payer: Self-pay | Admitting: Oncology

## 2017-06-07 ENCOUNTER — Telehealth: Payer: Self-pay | Admitting: Oncology

## 2017-06-07 VITALS — BP 135/75 | HR 76 | Temp 98.5°F | Resp 18 | Ht 69.0 in | Wt 114.3 lb

## 2017-06-07 DIAGNOSIS — Z79899 Other long term (current) drug therapy: Secondary | ICD-10-CM

## 2017-06-07 DIAGNOSIS — E119 Type 2 diabetes mellitus without complications: Secondary | ICD-10-CM | POA: Diagnosis not present

## 2017-06-07 DIAGNOSIS — C166 Malignant neoplasm of greater curvature of stomach, unspecified: Secondary | ICD-10-CM | POA: Insufficient documentation

## 2017-06-07 DIAGNOSIS — Z903 Acquired absence of stomach [part of]: Secondary | ICD-10-CM | POA: Diagnosis not present

## 2017-06-07 DIAGNOSIS — Z9221 Personal history of antineoplastic chemotherapy: Secondary | ICD-10-CM | POA: Diagnosis not present

## 2017-06-07 DIAGNOSIS — Z923 Personal history of irradiation: Secondary | ICD-10-CM | POA: Diagnosis not present

## 2017-06-07 NOTE — Telephone Encounter (Signed)
Scheduled appt per 2/1 los - Gave patient AVS and calender per los. 2 month f/u .Marland Kitchen

## 2017-06-07 NOTE — Progress Notes (Signed)
[place] OFFICE PROGRESS NOTE   Diagnosis: Gastric cancer  INTERVAL HISTORY:   [name] returns as scheduled.  He reports feeling well.  He is working.  He has a good appetite.  He is eating, but has difficulty gaining weight.  Reglan has helped the postprandial bloating.  He says it works better when he takes a 20 mg dose of Reglan.  Objective:  Vital signs in last 24 hours:  Blood pressure 135/75, pulse 76, temperature 98.5 F (36.9 C), temperature source Oral, resp. rate 18, height '5\' 9"'  (1.753 m), weight 114 lb 4.8 oz (51.8 kg), SpO2 100 %.    HEENT: Neck without mass Lymphatics: "Shotty "axillary and inguinal nodes Resp: Lungs clear bilaterally Cardio: Regular rate and rhythm GI: No hepatomegaly, no mass, nontender Vascular: No leg edema   Lab Results:  Lab Results  Component Value Date   WBC 2.8 (L) 2020-07-2116   HGB 12.3 (L) 2020-07-2116   HCT 36.6 (L) 2020-07-2116   MCV 87.1 2020-07-2116   PLT 225 2020-07-2116   NEUTROABS 1.4 (L) 2020-07-2116    CMP     Component Value Date/Time   NA 139 2020-07-2116 1053   K 4.0 2020-07-2116 1053   CL 101 01/30/2015 0526   CO2 26 2020-07-2116 1053   GLUCOSE 87 2020-07-2116 1053   BUN 10.3 2020-07-2116 1053   CREATININE 1.1 2020-07-2116 1053   CALCIUM 8.9 2020-07-2116 1053   PROT 7.0 2020-07-2116 1053   ALBUMIN 4.1 2020-07-2116 1053   AST 20 2020-07-2116 1053   ALT 14 2020-07-2116 1053   ALKPHOS 85 2020-07-2116 1053   BILITOT 0.52 2020-07-2116 1053   GFRNONAA >60 01/30/2015 0526   GFRAA >60 01/30/2015 0526    Lab Results  Component Value Date   CEA1 4.39 2020-07-2116     Medications: I have reviewed the patient's current medications.   Assessment/Plan: 1. 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 Staging CT scans at [facility] on 12/10/2014 with small lymph nodes along the greater curvature of the stomach, no evidence of distant metastatic disease,  nonspecific small lung nodules ? Status post one cycle of weekly 5-FU/leucovorin beginning 02/18/2015 (weekly 4) ? Initiation of radiation and Xeloda 03/23/2015; completed 05/06/2015 ? Adjuvant 5-FU/leucovorin resumed 06/03/2015; completed 07/29/2015 ? Upper endoscopy 05/11/2016-examined esophagus normal.Z-line regular. Evidence of subtotal gastrectomy found in the stomach. This was characterized by healthy-appearing mucosa except with areas of patchy telangiectasias. Biopsy showed chronic focally active gastritis. ? CTs abdomen/pelvis 11/30/2016-liver normal. Unchanged 1.5 cm cyst left hepatic lobe and 1.1 cm cyst hepatic dome. Oral contrast material throughout the stomach, small and large bowel. Postsurgical changes compatible withpartial gastrectomy. Anastomosis appears patent. Appears to be some wall thickening of the small bowel at the gastroenteric anastomosis near the suture line. Filling defect along the inferior aspect of the stomach. No free fluid or free intraperitoneal air. No evidence for small bowel obstruction. ? Upper endoscopy 01/29/2017-normal esophagus. Z line regular, 42 cm from the incisors. Patent Billroth II gastrojejunostomy with healthy appearing mucosa; congested and erythematous mucosa in the anastomosis. Biopsy-gastric mucosa with chronic inflammation. Negative for H. pylori organisms. Negative for infiltrating epithelial cells. ? CTs 04/09/2017-no evidence of recurrent gastric cancer 2. Diabetes 3. History of mild neutropenia. Likely a benign normal variant. 4. Postprandial bloating, weight loss summer/fall 2018-symptoms improved with metoclopramide/Protonix initiated 2020-07-2116   Disposition: He appears unchanged.  He will continue metoclopramide.  I encouraged him to not go above  the 10 mg dose.  He will use nutrition supplements in an attempt to gain weight.  Mr. Mahabir will return for an office visit in 2 months.  15 minutes were spent with the patient  today.  The majority of the time was used for counseling and coordination of care.  Betsy Coder, MD  06/07/2017  10:00 AM

## 2017-08-09 ENCOUNTER — Inpatient Hospital Stay: Payer: 59 | Attending: Oncology | Admitting: Nurse Practitioner

## 2017-08-09 ENCOUNTER — Telehealth: Payer: Self-pay

## 2017-08-09 ENCOUNTER — Encounter: Payer: Self-pay | Admitting: Nurse Practitioner

## 2017-08-09 VITALS — BP 123/65 | HR 58 | Temp 98.1°F | Resp 17 | Ht 69.0 in | Wt 112.8 lb

## 2017-08-09 DIAGNOSIS — Z79899 Other long term (current) drug therapy: Secondary | ICD-10-CM | POA: Diagnosis not present

## 2017-08-09 DIAGNOSIS — Z9221 Personal history of antineoplastic chemotherapy: Secondary | ICD-10-CM | POA: Insufficient documentation

## 2017-08-09 DIAGNOSIS — R1110 Vomiting, unspecified: Secondary | ICD-10-CM | POA: Diagnosis not present

## 2017-08-09 DIAGNOSIS — E119 Type 2 diabetes mellitus without complications: Secondary | ICD-10-CM | POA: Insufficient documentation

## 2017-08-09 DIAGNOSIS — C166 Malignant neoplasm of greater curvature of stomach, unspecified: Secondary | ICD-10-CM | POA: Diagnosis present

## 2017-08-09 DIAGNOSIS — Z903 Acquired absence of stomach [part of]: Secondary | ICD-10-CM | POA: Diagnosis not present

## 2017-08-09 DIAGNOSIS — Z923 Personal history of irradiation: Secondary | ICD-10-CM | POA: Insufficient documentation

## 2017-08-09 DIAGNOSIS — C169 Malignant neoplasm of stomach, unspecified: Secondary | ICD-10-CM

## 2017-08-09 MED ORDER — PANTOPRAZOLE SODIUM 40 MG PO TBEC: 40.0000 mg | DELAYED_RELEASE_TABLET | Freq: Two times a day (BID) | ORAL | 1 refills | Status: DC

## 2017-08-09 NOTE — Progress Notes (Addendum)
[place] OFFICE PROGRESS NOTE   Diagnosis: Gastric cancer  INTERVAL HISTORY:   Troy Barrera returns as scheduled.  He reports intermittent vomiting after eating "solid food".  He tolerates liquids without difficulty.  He continues Reglan before meals.  He takes Protonix once a day.  Overall his weight is stable.  He notes that it fluctuates within a few pounds.  He denies pain.  He thinks the Reglan is causing him to "drool" at nighttime.  He feels "cold" at times.  Objective:  Vital signs in last 24 hours:  Blood pressure 123/65, pulse (!) 58, temperature 98.1 F (36.7 C), temperature source Oral, resp. rate 17, height '5\' 9"'  (1.753 m), weight 112 lb 12.8 oz (51.2 kg), SpO2 100 %.    HEENT: Neck without mass. Lymphatics: No palpable cervical, supraclavicular, axillary or inguinal lymph nodes. Resp: Lungs clear bilaterally. Cardio: Regular rate and rhythm. GI: Abdomen soft and nontender.  No hepatomegaly. Vascular: No leg edema. Neuro: Alert and oriented. Skin: Warm and dry.   Lab Results:  Lab Results  Component Value Date   WBC 2.8 (L) 01/28/202018   HGB 12.3 (L) 01/28/202018   HCT 36.6 (L) 01/28/202018   MCV 87.1 01/28/202018   PLT 225 01/28/202018   NEUTROABS 1.4 (L) 01/28/202018    Imaging:  No results found.  Medications: I have reviewed the patient's current medications.  Assessment/Plan: 1. 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 Staging CT scans at [facility] on 12/10/2014 with small lymph nodes along the greater curvature of the stomach, no evidence of distant metastatic disease, nonspecific small lung nodules ? Status post one cycle of weekly 5-FU/leucovorin beginning 02/18/2015 (weekly 4) ? Initiation of radiation and Xeloda 03/23/2015; completed 05/06/2015 ? Adjuvant 5-FU/leucovorin resumed 06/03/2015; completed 07/29/2015 ? Upper endoscopy 05/11/2016-examined esophagus  normal.Z-line regular. Evidence of subtotal gastrectomy found in the stomach. This was characterized by healthy-appearing mucosa except with areas of patchy telangiectasias. Biopsy showed chronic focally active gastritis. ? CTs abdomen/pelvis 11/30/2016-liver normal. Unchanged 1.5 cm cyst left hepatic lobe and 1.1 cm cyst hepatic dome. Oral contrast material throughout the stomach, small and large bowel. Postsurgical changes compatible withpartial gastrectomy. Anastomosis appears patent. Appears to be some wall thickening of the small bowel at the gastroenteric anastomosis near the suture line. Filling defect along the inferior aspect of the stomach. No free fluid or free intraperitoneal air. No evidence for small bowel obstruction. ? Upper endoscopy 01/29/2017-normal esophagus. Z line regular, 42 cm from the incisors. Patent Billroth II gastrojejunostomy with healthy appearing mucosa; congested and erythematous mucosa in the anastomosis. Biopsy-gastric mucosa with chronic inflammation. Negative for H. pylori organisms. Negative for infiltrating epithelial cells. ? CTs 04/09/2017-no evidence of recurrent gastric cancer 2. Diabetes 3. History of mild neutropenia. Likely a benign normal variant. 4. Postprandial bloating, weight loss summer/fall 2018-symptoms improved with metoclopramide/Protonix initiated 01/28/202018    Disposition: Troy Barrera appears stable.  He is having intermittent regurgitation/vomiting following intake of solid food. He is tolerating liquids without difficulty.  We are referring him for a gastric emptying study.  He will increase Protonix from once daily to twice daily.  He will elevate the head of his bed at nighttime.  We also recommend he follow-up with Dr. Michail Sermon.  He will call to schedule that appointment.  He will return for a follow-up visit here in 6 weeks.  Patient seen with Dr. Benay Spice.  25 minutes were spent face-to-face at today's visit  with the majority of  that time involved in counseling/coordination of care.    Ned Card ANP/GNP-BC   08/09/2017  9:28 AM This was a shared visit with Ned Card.  Troy Barrera continues to have intermittent regurgitation.  We increased the Protonix dose.  He continues metoclopramide.  He will be referred for a gastric emptying study.  We referred him to Dr. Michail Sermon.  Julieanne Manson, MD

## 2017-08-09 NOTE — Telephone Encounter (Signed)
Printed avs and calender of upcoming appointment. Per 4/5 los 

## 2017-09-20 ENCOUNTER — Telehealth: Payer: Self-pay | Admitting: Oncology

## 2017-09-20 ENCOUNTER — Inpatient Hospital Stay: Payer: 59 | Admitting: Oncology

## 2017-09-20 ENCOUNTER — Telehealth: Payer: Self-pay | Admitting: *Deleted

## 2017-09-20 NOTE — Telephone Encounter (Signed)
Left message for patient to call back to set up f/u.

## 2017-09-20 NOTE — Telephone Encounter (Signed)
Received fax from [facility]: pt called to say he won't make today's appointment. Left message instructing him to call back to reschedule.

## 2017-10-01 ENCOUNTER — Other Ambulatory Visit: Payer: Self-pay | Admitting: Nurse Practitioner

## 2017-10-01 DIAGNOSIS — C169 Malignant neoplasm of stomach, unspecified: Secondary | ICD-10-CM

## 2017-10-07 ENCOUNTER — Telehealth: Payer: Self-pay | Admitting: Oncology

## 2017-10-07 NOTE — Telephone Encounter (Signed)
Returned call to patient requesting to reschedule appt per 6/3 voicemail from patient

## 2017-10-18 ENCOUNTER — Inpatient Hospital Stay: Payer: 59 | Attending: Oncology | Admitting: Nurse Practitioner

## 2017-10-18 ENCOUNTER — Telehealth: Payer: Self-pay | Admitting: Nurse Practitioner

## 2017-10-18 ENCOUNTER — Encounter: Payer: Self-pay | Admitting: Nurse Practitioner

## 2017-10-18 VITALS — BP 129/92 | HR 80 | Temp 98.4°F | Resp 18 | Ht 69.0 in | Wt 108.2 lb

## 2017-10-18 DIAGNOSIS — R531 Weakness: Secondary | ICD-10-CM | POA: Diagnosis not present

## 2017-10-18 DIAGNOSIS — C166 Malignant neoplasm of greater curvature of stomach, unspecified: Secondary | ICD-10-CM | POA: Diagnosis not present

## 2017-10-18 DIAGNOSIS — Z9221 Personal history of antineoplastic chemotherapy: Secondary | ICD-10-CM | POA: Diagnosis not present

## 2017-10-18 DIAGNOSIS — Z79899 Other long term (current) drug therapy: Secondary | ICD-10-CM | POA: Diagnosis not present

## 2017-10-18 DIAGNOSIS — R5381 Other malaise: Secondary | ICD-10-CM | POA: Diagnosis not present

## 2017-10-18 DIAGNOSIS — Z923 Personal history of irradiation: Secondary | ICD-10-CM | POA: Insufficient documentation

## 2017-10-18 DIAGNOSIS — R6881 Early satiety: Secondary | ICD-10-CM | POA: Diagnosis not present

## 2017-10-18 DIAGNOSIS — E46 Unspecified protein-calorie malnutrition: Secondary | ICD-10-CM | POA: Diagnosis not present

## 2017-10-18 DIAGNOSIS — R1110 Vomiting, unspecified: Secondary | ICD-10-CM | POA: Diagnosis not present

## 2017-10-18 DIAGNOSIS — R634 Abnormal weight loss: Secondary | ICD-10-CM | POA: Diagnosis not present

## 2017-10-18 DIAGNOSIS — C169 Malignant neoplasm of stomach, unspecified: Secondary | ICD-10-CM

## 2017-10-18 DIAGNOSIS — E119 Type 2 diabetes mellitus without complications: Secondary | ICD-10-CM | POA: Diagnosis not present

## 2017-10-18 DIAGNOSIS — R110 Nausea: Secondary | ICD-10-CM

## 2017-10-18 NOTE — Progress Notes (Addendum)
[place] OFFICE PROGRESS NOTE   Diagnosis: Gastric cancer  INTERVAL HISTORY:   Troy Barrera returns for follow-up.  He continues to have early satiety.  He states he is not eating due to the uncomfortable sensation he develops after eating.  He continues to vomit intermittently after intake of solids.  He tolerates liquids without difficulty.  For the past several days he has felt weak.  He has been unable to work.  No diarrhea.  He has not had the gastric emptying study ordered after his last visit.  Objective:  Vital signs in last 24 hours:  Blood pressure (!) 129/92, pulse 80, temperature 98.4 F (36.9 C), temperature source Oral, resp. rate 18, height _0  (1.753 m), weight 108 lb 3.2 oz (49.1 kg), SpO2 99 %.    HEENT: No thrush or ulcers. Lymphatics: No palpable cervical, supraclavicular or axillary lymph nodes. Resp: Lungs clear bilaterally. Cardio: Regular rate and rhythm. GI: Abdomen soft and nontender.  No hepatomegaly.  Small nodular area within the midline scar, question suture granuloma. Vascular: No leg edema.   Lab Results:  Lab Results  Component Value Date   WBC 2.8 (L) 02-26-202018   HGB 12.3 (L) 02-26-202018   HCT 36.6 (L) 02-26-202018   MCV 87.1 02-26-202018   PLT 225 02-26-202018   NEUTROABS 1.4 (L) 02-26-202018    Imaging:  No results found.  Medications: I have reviewed the patient's current medications.  Assessment/Plan: 1. 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 Staging CT scans at [facility] on 12/10/2014 with small lymph nodes along the greater curvature of the stomach, no evidence of distant metastatic disease, nonspecific small lung nodules ? Status post one cycle of weekly 5-FU/leucovorin beginning 02/18/2015 (weekly 4) ? Initiation of radiation and Xeloda 03/23/2015; completed 05/06/2015 ? Adjuvant 5-FU/leucovorin resumed 06/03/2015; completed  07/29/2015 ? Upper endoscopy 05/11/2016-examined esophagus normal.Z-line regular. Evidence of subtotal gastrectomy found in the stomach. This was characterized by healthy-appearing mucosa except with areas of patchy telangiectasias. Biopsy showed chronic focally active gastritis. ? CTs abdomen/pelvis 11/30/2016-liver normal. Unchanged 1.5 cm cyst left hepatic lobe and 1.1 cm cyst hepatic dome. Oral contrast material throughout the stomach, small and large bowel. Postsurgical changes compatible withpartial gastrectomy. Anastomosis appears patent. Appears to be some wall thickening of the small bowel at the gastroenteric anastomosis near the suture line. Filling defect along the inferior aspect of the stomach. No free fluid or free intraperitoneal air. No evidence for small bowel obstruction. ? Upper endoscopy 01/29/2017-normal esophagus. Z line regular, 42 cm from the incisors. Patent Billroth II gastrojejunostomy with healthy appearing mucosa; congested and erythematous mucosa in the anastomosis. Biopsy-gastric mucosa with chronic inflammation. Negative for H. pylori organisms. Negative for infiltrating epithelial cells. ? CTs 04/09/2017-no evidence of recurrent gastric cancer 2. Diabetes 3. History of mild neutropenia. Likely a benign normal variant. 4. Postprandial bloating, weight loss summer/fall 2018-symptoms improved with metoclopramide/Protonix initiated 02-26-202018    Disposition: Troy Barrera appears unchanged.  He continues to have early satiety and intermittent regurgitation/vomiting.  He is scheduled to have the gastric emptying study 10/23/2017.  He will contact Dr. Michail Sermon for a follow-up appointment.  He is debilitated secondary to malnutrition related to the previous gastric surgery.  We feel that he should qualify for medical disability.  He plans to initiate the process.  He will return for a follow-up visit here in 3 to 4 weeks.  He will contact the office in the  interim  with any problems.  Patient seen with Dr. Benay Spice.   Ned Card ANP/GNP-BC   10/18/2017  10:02 AM  This was a shared visit with Ned Card.  Troy Barrera interviewed and examined.  He continues to have early satiety.  He will complete the gastric emptying study next week.  He continues to lose weight and appears malnourished.  He should qualify for medical disability secondary to the gastrectomy procedure and malnutrition.  Julieanne Manson, MD

## 2017-10-18 NOTE — Telephone Encounter (Signed)
Scheduled appt per 6/14 los- gave patient aVS and calender per los.  

## 2017-10-19 ENCOUNTER — Other Ambulatory Visit: Payer: Self-pay | Admitting: Nurse Practitioner

## 2017-10-19 DIAGNOSIS — E119 Type 2 diabetes mellitus without complications: Secondary | ICD-10-CM

## 2017-10-19 DIAGNOSIS — C169 Malignant neoplasm of stomach, unspecified: Secondary | ICD-10-CM

## 2017-10-23 ENCOUNTER — Encounter (HOSPITAL_COMMUNITY): Payer: Self-pay

## 2017-10-23 ENCOUNTER — Encounter (HOSPITAL_COMMUNITY)
Admission: RE | Admit: 2017-10-23 | Discharge: 2017-10-23 | Disposition: A | Discharge status: Home / Self Care | Payer: 59 | Source: Ambulatory Visit | Attending: Nurse Practitioner | Admitting: Nurse Practitioner

## 2017-10-23 DIAGNOSIS — C169 Malignant neoplasm of stomach, unspecified: Secondary | ICD-10-CM | POA: Diagnosis present

## 2017-10-23 MED ORDER — TECHNETIUM TC 99M SULFUR COLLOID
2.0000 | Freq: Once | INTRAVENOUS | Status: AC | PRN
  Administered 2017-10-23: 2 via ORAL

## 2017-11-14 ENCOUNTER — Telehealth: Payer: Self-pay | Admitting: Nurse Practitioner

## 2017-11-14 NOTE — Telephone Encounter (Signed)
Returned Vm from 7/11 requesting to cancel 7/12 appt.  I advised him to CB to reschedule

## 2017-11-15 ENCOUNTER — Inpatient Hospital Stay: Payer: 59 | Admitting: Nurse Practitioner

## 2017-11-25 ENCOUNTER — Telehealth: Payer: Self-pay | Admitting: Oncology

## 2017-11-25 NOTE — Telephone Encounter (Signed)
Returned patient vmail message to r/s 7/12 appt - r/s appt to 8/9 - left message with appt date and time.

## 2017-11-30 ENCOUNTER — Other Ambulatory Visit: Payer: Self-pay | Admitting: Nurse Practitioner

## 2017-11-30 DIAGNOSIS — C169 Malignant neoplasm of stomach, unspecified: Secondary | ICD-10-CM

## 2017-12-02 ENCOUNTER — Other Ambulatory Visit: Payer: Self-pay | Admitting: Emergency Medicine

## 2017-12-02 MED ORDER — PANTOPRAZOLE SODIUM 40 MG PO TBEC: 40.0000 mg | DELAYED_RELEASE_TABLET | Freq: Every day | ORAL | 0 refills | Status: DC

## 2017-12-13 ENCOUNTER — Inpatient Hospital Stay: Payer: 59 | Attending: Oncology | Admitting: Oncology

## 2017-12-18 ENCOUNTER — Telehealth: Payer: Self-pay | Admitting: Oncology

## 2017-12-18 NOTE — Telephone Encounter (Signed)
Scheduled appt per 8/9 sch message - left message on pt phone and called and spoke with daughter regarding upcoming appts.

## 2017-12-19 ENCOUNTER — Telehealth: Payer: Self-pay | Admitting: Oncology

## 2017-12-19 NOTE — Telephone Encounter (Signed)
Patent left message asking that his 8/16 appointment be cancelled. Patient has no appointment in our office for 8/16. Next appointment is 8/23. Left message for both patient and his dtr and mailed schedule. See also previous note.

## 2017-12-25 ENCOUNTER — Telehealth: Payer: Self-pay | Admitting: Oncology

## 2017-12-25 NOTE — Telephone Encounter (Signed)
Patient called to cancel °

## 2017-12-27 ENCOUNTER — Ambulatory Visit: Payer: 59 | Admitting: Oncology

## 2018-01-07 ENCOUNTER — Other Ambulatory Visit: Payer: Self-pay | Admitting: Oncology

## 2018-01-08 ENCOUNTER — Other Ambulatory Visit: Payer: Self-pay

## 2018-01-08 NOTE — Telephone Encounter (Addendum)
8 - Spoke with pt to inform that Reglan has been refilled per request, ok per MD. Also informed pt that Dr. Benay Spice wants pt to reschedule appt with our office to be seen soon. Pt voiced understanding. Will call scheduling office to reschedule.    Received call from pt requesting refill for "medication that keeps me from bloating". This RN returned call to pt in attempt to figure out which medication needs to be refilled and LVM to return call to office

## 2018-01-23 ENCOUNTER — Other Ambulatory Visit: Payer: Self-pay | Admitting: Nurse Practitioner

## 2018-02-25 ENCOUNTER — Other Ambulatory Visit: Payer: Self-pay | Admitting: Oncology

## 2018-04-22 ENCOUNTER — Other Ambulatory Visit: Payer: Self-pay | Admitting: Oncology

## 2018-04-23 ENCOUNTER — Telehealth: Payer: Self-pay

## 2018-04-23 NOTE — Telephone Encounter (Signed)
2nd Call to Pt. Informed Pt. Dr. Benay Spice would like to see him sometime in January. Pt. Verbalized understanding, Expressed he would like an appointment on any Friday in January in the morning. Note sent to scheduling

## 2018-04-23 NOTE — Telephone Encounter (Signed)
Attempted to leave a message for Pt. To inform him that we refilled his medication but he needs to make an appointment with Dr. Benay Spice or Lattie Haw in January for follow up.

## 2018-04-24 ENCOUNTER — Telehealth: Payer: Self-pay | Admitting: Oncology

## 2018-04-24 NOTE — Telephone Encounter (Signed)
Scheduled appt per 12/18 sch message - left  Message with appt date and time and sent reminder letter in the mail .

## 2018-05-06 ENCOUNTER — Telehealth: Payer: Self-pay | Admitting: *Deleted

## 2018-05-06 NOTE — Telephone Encounter (Signed)
Called to report his job "expired" and he no longer has insurance. Asking what he can do? Attempted to call back with no answer and mailbox is full. Will alert financial advocate, Marguarite Arbour of the call to reach out.

## 2018-05-23 ENCOUNTER — Inpatient Hospital Stay: Payer: 59 | Admitting: Nurse Practitioner

## 2018-05-23 ENCOUNTER — Telehealth: Payer: Self-pay | Admitting: *Deleted

## 2018-05-23 NOTE — Telephone Encounter (Signed)
Called to move his appointment today to February. Has no medical insurance as of 05/06/18. Hopes to have insurance by end of January. Has filed for disability with social security. Appointment rescheduled as requested.

## 2018-06-27 ENCOUNTER — Inpatient Hospital Stay: Payer: Self-pay | Admitting: Nurse Practitioner

## 2018-07-11 ENCOUNTER — Other Ambulatory Visit: Payer: Self-pay | Admitting: *Deleted

## 2018-07-11 ENCOUNTER — Other Ambulatory Visit: Payer: Self-pay

## 2018-07-11 ENCOUNTER — Telehealth: Payer: Self-pay | Admitting: Oncology

## 2018-07-11 ENCOUNTER — Inpatient Hospital Stay: Payer: PRIVATE HEALTH INSURANCE

## 2018-07-11 ENCOUNTER — Telehealth: Payer: Self-pay | Admitting: *Deleted

## 2018-07-11 ENCOUNTER — Inpatient Hospital Stay: Payer: PRIVATE HEALTH INSURANCE | Attending: Oncology | Admitting: Oncology

## 2018-07-11 VITALS — BP 136/93 | HR 87 | Temp 98.3°F | Resp 17 | Wt 102.6 lb

## 2018-07-11 DIAGNOSIS — Z9221 Personal history of antineoplastic chemotherapy: Secondary | ICD-10-CM | POA: Diagnosis not present

## 2018-07-11 DIAGNOSIS — E119 Type 2 diabetes mellitus without complications: Secondary | ICD-10-CM | POA: Insufficient documentation

## 2018-07-11 DIAGNOSIS — Z923 Personal history of irradiation: Secondary | ICD-10-CM | POA: Insufficient documentation

## 2018-07-11 DIAGNOSIS — R6881 Early satiety: Secondary | ICD-10-CM | POA: Diagnosis not present

## 2018-07-11 DIAGNOSIS — Z79899 Other long term (current) drug therapy: Secondary | ICD-10-CM | POA: Diagnosis not present

## 2018-07-11 DIAGNOSIS — R5383 Other fatigue: Secondary | ICD-10-CM | POA: Diagnosis not present

## 2018-07-11 DIAGNOSIS — C169 Malignant neoplasm of stomach, unspecified: Secondary | ICD-10-CM

## 2018-07-11 DIAGNOSIS — R5381 Other malaise: Secondary | ICD-10-CM | POA: Diagnosis not present

## 2018-07-11 DIAGNOSIS — Z85028 Personal history of other malignant neoplasm of stomach: Secondary | ICD-10-CM | POA: Insufficient documentation

## 2018-07-11 LAB — COMPREHENSIVE METABOLIC PANEL
ALT: 20 U/L (ref 0–44)
AST: 24 U/L (ref 15–41)
Albumin: 2.6 g/dL — ABNORMAL LOW (ref 3.5–5.0)
Alkaline Phosphatase: 90 U/L (ref 38–126)
Anion gap: 5 (ref 5–15)
BUN: 11 mg/dL (ref 8–23)
CHLORIDE: 106 mmol/L (ref 98–111)
CO2: 30 mmol/L (ref 22–32)
Calcium: 7.7 mg/dL — ABNORMAL LOW (ref 8.9–10.3)
Creatinine, Ser: 0.85 mg/dL (ref 0.61–1.24)
GFR calc Af Amer: >60 mL/min (ref >60)
GFR calc non Af Amer: >60 mL/min (ref >60)
Glucose, Bld: 117 mg/dL — ABNORMAL HIGH (ref 70–99)
Potassium: 2.8 mmol/L — ABNORMAL LOW (ref 3.5–5.1)
Sodium: 141 mmol/L (ref 135–145)
TOTAL PROTEIN: 5.4 g/dL — AB (ref 6.5–8.1)
Total Bilirubin: 0.7 mg/dL (ref 0.3–1.2)

## 2018-07-11 LAB — VITAMIN B12: Vitamin B-12: 391 pg/mL (ref 180–914)

## 2018-07-11 LAB — CBC WITH DIFFERENTIAL (CANCER CENTER ONLY)
Abs Immature Granulocytes: 0.02 10*3/uL (ref 0.00–0.07)
BASOS ABS: 0 10*3/uL (ref 0.0–0.1)
Basophils Relative: 1 %
Eosinophils Absolute: 0 10*3/uL (ref 0.0–0.5)
Eosinophils Relative: 1 %
HCT: 33 % — ABNORMAL LOW (ref 39.0–52.0)
Hemoglobin: 11.2 g/dL — ABNORMAL LOW (ref 13.0–17.0)
IMMATURE GRANULOCYTES: 0 %
Lymphocytes Relative: 17 %
Lymphs Abs: 0.8 10*3/uL (ref 0.7–4.0)
MCH: 29 pg (ref 26.0–34.0)
MCHC: 33.9 g/dL (ref 30.0–36.0)
MCV: 85.5 fL (ref 80.0–100.0)
Monocytes Absolute: 0.4 10*3/uL (ref 0.1–1.0)
Monocytes Relative: 10 %
NEUTROS PCT: 71 %
Neutro Abs: 3.2 10*3/uL (ref 1.7–7.7)
Platelet Count: 299 10*3/uL (ref 150–400)
RBC: 3.86 MIL/uL — AB (ref 4.22–5.81)
RDW: 17.1 % — ABNORMAL HIGH (ref 11.5–15.5)
WBC: 4.5 10*3/uL (ref 4.0–10.5)
nRBC: 0 % (ref 0.0–0.2)

## 2018-07-11 LAB — TSH: TSH: 3.155 u[IU]/mL (ref 0.350–4.500)

## 2018-07-11 MED ORDER — METOCLOPRAMIDE HCL 10 MG PO TABS: ORAL_TABLET | ORAL | 1 refills | Status: AC

## 2018-07-11 MED ORDER — POTASSIUM CHLORIDE CRYS ER 20 MEQ PO TBCR: 20.0000 meq | EXTENDED_RELEASE_TABLET | Freq: Two times a day (BID) | ORAL | 1 refills | Status: DC

## 2018-07-11 NOTE — Addendum Note (Signed)
Addended by: Tania Ade on: 07/11/2018 12:52 PM   Modules accepted: Orders

## 2018-07-11 NOTE — Telephone Encounter (Signed)
Patient did not want to schedule nutrition appt at the moment per 3/6 los  Printed calendar and avs.

## 2018-07-11 NOTE — Progress Notes (Signed)
[facility] OFFICE PROGRESS NOTE   Diagnosis: Gastric cancer  INTERVAL HISTORY:   [name] turns for a scheduled visit.  He continues to have early satiety.  Metoclopramide helps.  He is taking metoclopramide 3 times per day.  No pain.  No difficulty with bowel function.  No bleeding.  His chief complaint is malaise.  He reports seeing Dr. Michail Sermon last year.    Objective:  Vital signs in last 24 hours:  Blood pressure (!) 136/93, pulse 87, temperature 98.3 F (36.8 C), temperature source Oral, resp. rate 17, weight 102 lb 9.6 oz (46.5 kg), SpO2 100 %.    HEENT: Neck without mass Lymphatics: "Shotty "bilateral axillary nodes, no cervical, supraclavicular, or inguinal nodes Resp: Lungs clear bilaterally Cardio: Regular rate and rhythm GI: No hepatosplenomegaly, no apparent ascites, no mass, nontender Vascular: Pitting edema at the ankles bilaterally   Lab Results:  Lab Results  Component Value Date   WBC 2.8 (L) 16-Dec-202018   HGB 12.3 (L) 16-Dec-202018   HCT 36.6 (L) 16-Dec-202018   MCV 87.1 16-Dec-202018   PLT 225 16-Dec-202018   NEUTROABS 1.4 (L) 16-Dec-202018    CMP  Lab Results  Component Value Date   NA 139 16-Dec-202018   K 4.0 16-Dec-202018   CL 101 01/30/2015   CO2 26 16-Dec-202018   GLUCOSE 87 16-Dec-202018   BUN 10.3 16-Dec-202018   CREATININE 1.1 16-Dec-202018   CALCIUM 8.9 16-Dec-202018   PROT 7.0 16-Dec-202018   ALBUMIN 4.1 16-Dec-202018   AST 20 16-Dec-202018   ALT 14 16-Dec-202018   ALKPHOS 85 16-Dec-202018   BILITOT 0.52 16-Dec-202018   GFRNONAA >60 01/30/2015   GFRAA >60 01/30/2015    Lab Results  Component Value Date   CEA1 4.39 16-Dec-202018     Medications: I have reviewed the patient's current medications.   Assessment/Plan: 1. 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 Staging CT scans at [facility] on 12/10/2014 with small lymph nodes along the greater curvature of the stomach, no evidence  of distant metastatic disease, nonspecific small lung nodules ? Status post one cycle of weekly 5-FU/leucovorin beginning 02/18/2015 (weekly 4) ? Initiation of radiation and Xeloda 03/23/2015; completed 05/06/2015 ? Adjuvant 5-FU/leucovorin resumed 06/03/2015; completed 07/29/2015 ? Upper endoscopy 05/11/2016-examined esophagus normal.Z-line regular. Evidence of subtotal gastrectomy found in the stomach. This was characterized by healthy-appearing mucosa except with areas of patchy telangiectasias. Biopsy showed chronic focally active gastritis. ? CTs abdomen/pelvis 11/30/2016-liver normal. Unchanged 1.5 cm cyst left hepatic lobe and 1.1 cm cyst hepatic dome. Oral contrast material throughout the stomach, small and large bowel. Postsurgical changes compatible withpartial gastrectomy. Anastomosis appears patent. Appears to be some wall thickening of the small bowel at the gastroenteric anastomosis near the suture line. Filling defect along the inferior aspect of the stomach. No free fluid or free intraperitoneal air. No evidence for small bowel obstruction. ? Upper endoscopy 01/29/2017-normal esophagus. Z line regular, 42 cm from the incisors. Patent Billroth II gastrojejunostomy with healthy appearing mucosa; congested and erythematous mucosa in the anastomosis. Biopsy-gastric mucosa with chronic inflammation. Negative for H. pylori organisms. Negative for infiltrating epithelial cells. ? CTs 04/09/2017-no evidence of recurrent gastric cancer 2. Diabetes 3. History of mild neutropenia. Likely a benign normal variant. 4. Postprandial bloating, weight loss summer/fall 2018-symptoms improved with metoclopramide/Protonix initiated 16-Dec-202018       Disposition: Troy Barrera has a remote history of gastric cancer.  He has a poor performance status and has  lost weight compared to when we saw him last year.  He missed several appointments here.  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  We will make referral to the Cancer center nutritionist.  We refilled his prescription for Reglan.  He says this helps with the early satiety.  We will check a CBC, chemistry panel, and vitamin B12 level today.  Troy Barrera no longer has a primary physician.  We will make a referral to family medicine in [place].  He will return for an office visit here in 3 weeks.  I have a low clinical suspicion for recurrent gastric cancer.  Betsy Coder, MD  07/11/2018  12:33 PM

## 2018-07-11 NOTE — Telephone Encounter (Signed)
Notified of low K+ and needs to start KCl 20 meq daily. Sent to his pharmacy. Will recheck in 3 weeks at 11:45

## 2018-07-14 ENCOUNTER — Telehealth: Payer: Self-pay | Admitting: Oncology

## 2018-07-14 NOTE — Telephone Encounter (Signed)
Scheduled appt per 3/06 sch message - left message for daughter with appt date and time

## 2018-07-16 ENCOUNTER — Telehealth: Payer: Self-pay | Admitting: *Deleted

## 2018-07-16 NOTE — Telephone Encounter (Signed)
Patient called to follow up on PCP appointment. Was informed by scheduler that [facility] is not accepting new patients at this time. Called and left VM for [facility] on [address]. [phone]) with request to call back regarding their current policy for accepting new patients.

## 2018-07-18 ENCOUNTER — Telehealth: Payer: Self-pay | Admitting: *Deleted

## 2018-07-18 NOTE — Telephone Encounter (Signed)
Called to cancel dietician visit on 07/21/18. Prefers to see her on day of next office visit. Scheduling message sent. Patient aware that [facility] not accepting new patients. Agrees to allow office to try [facility] or [facility] but make sure the know he has [facility].

## 2018-07-21 ENCOUNTER — Inpatient Hospital Stay: Payer: PRIVATE HEALTH INSURANCE | Admitting: Nutrition

## 2018-07-22 ENCOUNTER — Encounter: Payer: Self-pay | Admitting: *Deleted

## 2018-07-22 NOTE — Progress Notes (Signed)
2nd choice in PCP with [facility] does not accept new patients. A PA there does, but not a preferred provider under his plan ([employer]). Called insurance provider and list of [facility] PCP's that are preferred providers was emailed to this RN. Printed copy and mailed to patient to call and locate one that will accept him as a patient and inform this office and records will be sent at that time. Attempted to reach him at home, however his mailbox was full.

## 2018-08-01 ENCOUNTER — Telehealth: Payer: Self-pay | Admitting: *Deleted

## 2018-08-01 ENCOUNTER — Inpatient Hospital Stay: Payer: PRIVATE HEALTH INSURANCE | Admitting: Nurse Practitioner

## 2018-08-01 ENCOUNTER — Inpatient Hospital Stay: Payer: PRIVATE HEALTH INSURANCE

## 2018-08-01 NOTE — Telephone Encounter (Signed)
Left VM for patient that appointment today was canceled and he will be rescheduled for 4 weeks. Scheduling message sent.

## 2018-08-01 NOTE — Telephone Encounter (Signed)
VM from patient cancelling his appointment today.

## 2018-08-04 ENCOUNTER — Other Ambulatory Visit: Payer: Self-pay | Admitting: Oncology

## 2018-08-05 ENCOUNTER — Telehealth: Payer: Self-pay | Admitting: Oncology

## 2018-08-05 NOTE — Telephone Encounter (Signed)
Unable to reach patient - r/s appt per 3/27 sch message - left message and sent letter in the mail.

## 2018-08-27 ENCOUNTER — Telehealth: Payer: Self-pay | Admitting: Oncology

## 2018-08-27 ENCOUNTER — Telehealth: Payer: Self-pay | Admitting: *Deleted

## 2018-08-27 NOTE — Telephone Encounter (Signed)
R/s appt per 4/22 sch message - unable to reach patient . Rescheduled and left message for patient with appt date and time

## 2018-08-27 NOTE — Telephone Encounter (Signed)
Sent high priority scheduling message to get his lab, clinic visit and physician visit for tomorrow moved to Friday.  He cannot come on Thursdays...only on Fridays.  Thanks! Threasa Beards

## 2018-08-27 NOTE — Telephone Encounter (Signed)
"  [name], calling to speak with Dr. Gearldine Shown nurse.  I need to cancel tomorrow's appointments.   I've told them I can only come in on Friday's."  Expressed scheduling will be notified of request to reschedule appointments.  "Ask nurse to call me at [phone] to reschedule."

## 2018-08-28 ENCOUNTER — Inpatient Hospital Stay: Payer: PRIVATE HEALTH INSURANCE | Admitting: Oncology

## 2018-08-28 ENCOUNTER — Inpatient Hospital Stay: Payer: PRIVATE HEALTH INSURANCE | Admitting: Nutrition

## 2018-08-28 ENCOUNTER — Inpatient Hospital Stay: Payer: PRIVATE HEALTH INSURANCE

## 2018-08-29 ENCOUNTER — Other Ambulatory Visit: Payer: Self-pay

## 2018-08-29 ENCOUNTER — Inpatient Hospital Stay (HOSPITAL_BASED_OUTPATIENT_CLINIC_OR_DEPARTMENT_OTHER): Payer: PRIVATE HEALTH INSURANCE | Admitting: Oncology

## 2018-08-29 ENCOUNTER — Inpatient Hospital Stay: Payer: PRIVATE HEALTH INSURANCE | Attending: Oncology

## 2018-08-29 ENCOUNTER — Inpatient Hospital Stay: Payer: PRIVATE HEALTH INSURANCE | Admitting: Nutrition

## 2018-08-29 ENCOUNTER — Telehealth: Payer: Self-pay | Admitting: Oncology

## 2018-08-29 VITALS — BP 117/80 | HR 89 | Temp 97.8°F | Resp 18 | Ht 69.0 in | Wt 99.7 lb

## 2018-08-29 DIAGNOSIS — Z8639 Personal history of other endocrine, nutritional and metabolic disease: Secondary | ICD-10-CM

## 2018-08-29 DIAGNOSIS — Z9221 Personal history of antineoplastic chemotherapy: Secondary | ICD-10-CM | POA: Insufficient documentation

## 2018-08-29 DIAGNOSIS — Z79899 Other long term (current) drug therapy: Secondary | ICD-10-CM | POA: Diagnosis not present

## 2018-08-29 DIAGNOSIS — E119 Type 2 diabetes mellitus without complications: Secondary | ICD-10-CM

## 2018-08-29 DIAGNOSIS — R6881 Early satiety: Secondary | ICD-10-CM | POA: Insufficient documentation

## 2018-08-29 DIAGNOSIS — R140 Abdominal distension (gaseous): Secondary | ICD-10-CM | POA: Insufficient documentation

## 2018-08-29 DIAGNOSIS — C169 Malignant neoplasm of stomach, unspecified: Secondary | ICD-10-CM

## 2018-08-29 DIAGNOSIS — Z903 Acquired absence of stomach [part of]: Secondary | ICD-10-CM

## 2018-08-29 DIAGNOSIS — Z923 Personal history of irradiation: Secondary | ICD-10-CM

## 2018-08-29 DIAGNOSIS — R531 Weakness: Secondary | ICD-10-CM | POA: Insufficient documentation

## 2018-08-29 DIAGNOSIS — R634 Abnormal weight loss: Secondary | ICD-10-CM | POA: Diagnosis not present

## 2018-08-29 DIAGNOSIS — R609 Edema, unspecified: Secondary | ICD-10-CM | POA: Insufficient documentation

## 2018-08-29 DIAGNOSIS — Z85028 Personal history of other malignant neoplasm of stomach: Secondary | ICD-10-CM | POA: Diagnosis not present

## 2018-08-29 LAB — BASIC METABOLIC PANEL - CANCER CENTER ONLY
Anion gap: 8 (ref 5–15)
BUN: 11 mg/dL (ref 8–23)
CO2: 29 mmol/L (ref 22–32)
Calcium: 7.8 mg/dL — ABNORMAL LOW (ref 8.9–10.3)
Chloride: 107 mmol/L (ref 98–111)
Creatinine: 0.81 mg/dL (ref 0.61–1.24)
GFR, Est AFR Am: >60 mL/min (ref >60)
GFR, Estimated: >60 mL/min (ref >60)
Glucose, Bld: 103 mg/dL — ABNORMAL HIGH (ref 70–99)
Potassium: 3.5 mmol/L (ref 3.5–5.1)
Sodium: 144 mmol/L (ref 135–145)

## 2018-08-29 NOTE — Progress Notes (Signed)
Confirmed with his pharmacy that he has not filled his K+ since 07/11/18 and this was for #30 pills (15 day supply). Patient says they are too costly at $13.56 for #30, but $24.17 for #60. Called [facility] in [place] and per pharmacist, Jimmey Ralph using [facility] he can get #60 pills for $17.25. Patient informed,but insists he has plenty of K+ from prior refills. He also tells RN that he does not want to see the dietician today.

## 2018-08-29 NOTE — Telephone Encounter (Signed)
Scheduled appt per 4/24 los.  Did not reschedule nutrition appt, per me and barbara secure chat message.  Patient will call her when he is ready to reschedule his nutrition appt.

## 2018-08-29 NOTE — Progress Notes (Signed)
Patient declined nutrition follow up per RN. I will continue to be available as needed.

## 2018-08-29 NOTE — Progress Notes (Signed)
[facility] OFFICE PROGRESS NOTE   Diagnosis: Gastric cancer  INTERVAL HISTORY:   Troy Barrera is a scheduled visit last month.  He has not obtained a primary physician.  He complains of feeling "weak ".  He has difficulty tolerating solid food secondary to bloating.  He tolerates liquids and soft food well.  No pain or dyspnea.  Leg edema has improved.  Objective:  Vital signs in last 24 hours:  Blood pressure 117/80, pulse 89, temperature 97.8 F (36.6 C), temperature source Oral, resp. rate 18, height '5\' 9"'$  (1.753 m), weight 99 lb 11.2 oz (45.2 kg), SpO2 100 %.     Lymphatics: "Shotty "bilateral cervical and axillary nodes GI: No mass, nontender, no hepatomegaly, no apparent ascites Vascular: 1+ pitting edema at the ankle bilaterally     Lab Results:  Lab Results  Component Value Date   WBC 4.5 07/11/2018   HGB 11.2 (L) 07/11/2018   HCT 33.0 (L) 07/11/2018   MCV 85.5 07/11/2018   PLT 299 07/11/2018   NEUTROABS 3.2 07/11/2018    CMP  Lab Results  Component Value Date   NA 144 08/29/2018   K 3.5 08/29/2018   CL 107 08/29/2018   CO2 29 08/29/2018   GLUCOSE 103 (H) 08/29/2018   BUN 11 08/29/2018   CREATININE 0.81 08/29/2018   CALCIUM 7.8 (L) 08/29/2018   PROT 5.4 (L) 07/11/2018   ALBUMIN 2.6 (L) 07/11/2018   AST 24 07/11/2018   ALT 20 07/11/2018   ALKPHOS 90 07/11/2018   BILITOT 0.7 07/11/2018   GFRNONAA >60 08/29/2018   GFRAA >60 08/29/2018    Lab Results  Component Value Date   CEA1 4.39 2020-12-2316     Medications: I have reviewed the patient's current medications.   Assessment/Plan:  1. 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 Staging CT scans at [facility] on 12/10/2014 with small lymph nodes along the greater curvature of the stomach, no evidence of distant metastatic disease, nonspecific small lung nodules ? Status post one cycle of weekly  5-FU/leucovorin beginning 02/18/2015 (weekly 4) ? Initiation of radiation and Xeloda 03/23/2015; completed 05/06/2015 ? Adjuvant 5-FU/leucovorin resumed 06/03/2015; completed 07/29/2015 ? Upper endoscopy 05/11/2016-examined esophagus normal.Z-line regular. Evidence of subtotal gastrectomy found in the stomach. This was characterized by healthy-appearing mucosa except with areas of patchy telangiectasias. Biopsy showed chronic focally active gastritis. ? CTs abdomen/pelvis 11/30/2016-liver normal. Unchanged 1.5 cm cyst left hepatic lobe and 1.1 cm cyst hepatic dome. Oral contrast material throughout the stomach, small and large bowel. Postsurgical changes compatible withpartial gastrectomy. Anastomosis appears patent. Appears to be some wall thickening of the small bowel at the gastroenteric anastomosis near the suture line. Filling defect along the inferior aspect of the stomach. No free fluid or free intraperitoneal air. No evidence for small bowel obstruction. ? Upper endoscopy 01/29/2017-normal esophagus. Z line regular, 42 cm from the incisors. Patent Billroth II gastrojejunostomy with healthy appearing mucosa; congested and erythematous mucosa in the anastomosis. Biopsy-gastric mucosa with chronic inflammation. Negative for H. pylori organisms. Negative for infiltrating epithelial cells. ? CTs 04/09/2017-no evidence of recurrent gastric cancer 2. Diabetes 3. History of mild neutropenia. Likely a benign normal variant. 4. Postprandial bloating, weight loss summer/fall 2018-symptoms improved with metoclopramide/Protonix initiated 2020-12-2316     Disposition: Troy Barrera was diagnosed with gastric cancer in September 2016.  He continues to have early satiety and difficulty tolerating solid food.  This is likely secondary to the  subtotal gastrectomy.  Reglan helps.  He declined a nutrition appointment today.  He says that he will try to increase his weight prior to the next office visit.   I encouraged him to obtain a primary physician.  I have a low clinical suspicion for recurrence of gastric cancer, but this is possible.  We will consider scheduling restaging CTs if he continues to lose weight.   He agrees to a return visit in 3 weeks.   Betsy Coder, MD  08/29/2018  12:47 PM

## 2018-09-19 ENCOUNTER — Inpatient Hospital Stay: Payer: PRIVATE HEALTH INSURANCE | Attending: Oncology | Admitting: Nurse Practitioner

## 2018-11-05 ENCOUNTER — Telehealth: Payer: Self-pay | Admitting: *Deleted

## 2018-11-05 DIAGNOSIS — R5383 Other fatigue: Secondary | ICD-10-CM

## 2018-11-05 DIAGNOSIS — C169 Malignant neoplasm of stomach, unspecified: Secondary | ICD-10-CM

## 2018-11-05 DIAGNOSIS — R5381 Other malaise: Secondary | ICD-10-CM

## 2018-11-05 NOTE — Telephone Encounter (Signed)
Daughter reports he is still not eating and thinks he is losing more weight. He feels cold all the time and sits with electric blanket on him all the time. Will sit outside in 90 degree weather in his sweat shirt and pants. Scheduled him to see Dr. Benay Spice on July 31st, which is the first available that patient will agree to. Daughter requesting full panel of labs to include his ferritin. Informed her of last office visit and that he has declined to speak with dietician twice and missed his last appointment. She will be sure to bring him. Labs OK'd per MD

## 2018-12-05 ENCOUNTER — Inpatient Hospital Stay: Payer: PRIVATE HEALTH INSURANCE | Attending: Oncology | Admitting: Oncology

## 2018-12-05 ENCOUNTER — Inpatient Hospital Stay: Payer: PRIVATE HEALTH INSURANCE

## 2018-12-05 ENCOUNTER — Telehealth: Payer: Self-pay | Admitting: *Deleted

## 2018-12-05 NOTE — Telephone Encounter (Signed)
Notified daughter that he did not come today for his appointment. She was surprised, since they discussed the appointment last night and planned to come. She will f/u with him this weekend. Scheduling message sent to reschedule.

## 2018-12-08 ENCOUNTER — Telehealth: Payer: Self-pay | Admitting: Oncology

## 2018-12-08 NOTE — Telephone Encounter (Signed)
R/s appt per 7/31 sch message - pt daughter is aware. If it doesn't work she will give [place] a call back to reschedule.

## 2018-12-12 ENCOUNTER — Telehealth: Payer: Self-pay | Admitting: *Deleted

## 2018-12-12 ENCOUNTER — Inpatient Hospital Stay: Payer: PRIVATE HEALTH INSURANCE | Admitting: Nurse Practitioner

## 2018-12-12 ENCOUNTER — Inpatient Hospital Stay: Payer: PRIVATE HEALTH INSURANCE

## 2018-12-12 NOTE — Telephone Encounter (Signed)
Left VM that he overslept and needs to reschedule. Scheduling message sent w/request to notify daughter of appointment.

## 2018-12-15 ENCOUNTER — Telehealth: Payer: Self-pay | Admitting: Oncology

## 2018-12-15 ENCOUNTER — Telehealth: Payer: Self-pay | Admitting: *Deleted

## 2018-12-15 NOTE — Telephone Encounter (Signed)
Called pt daughter per 8/07 sch message- unable to reach pt daughter - left message for her to call back to reschedule appt.

## 2018-12-15 NOTE — Telephone Encounter (Signed)
Daughter calling for appointment with Dr. Benay Spice this week if possible. Is concerned about recent BLE edema and swelling in his left arm. Asking to be seen on Wednesday. Informed her the only available appointment is tomorrow with the NP at 1115/1145. She will check with patient to see if he agrees.  Informed her we have been trying to reach him and her without success. She understands and apologizes-she is working and her father does not usually answer the phone.

## 2018-12-15 NOTE — Telephone Encounter (Signed)
Left VM requesting to reschedule the missed appointment of 12/12/18. Scheduling message was sent to scheduling on 12/12/18. Will notify department he is calling again.

## 2018-12-16 ENCOUNTER — Ambulatory Visit: Payer: No Typology Code available for payment source | Admitting: Nurse Practitioner

## 2018-12-16 ENCOUNTER — Other Ambulatory Visit: Payer: No Typology Code available for payment source

## 2018-12-16 ENCOUNTER — Telehealth: Payer: Self-pay | Admitting: *Deleted

## 2018-12-16 NOTE — Telephone Encounter (Signed)
Offered appointment with Dr. Benay Spice tomorrow at 10 am. Patient declined. Insists on Friday and needs it later in morning and only wants to see Dr. Benay Spice. Daughter scheduled it for 01/23/19 at 1030/1100. She will bring him to the appointment. IN the meantime, he will elevate his legs as much as possible, limit his salt intake and she will see if he agrees to support hose.

## 2018-12-19 ENCOUNTER — Other Ambulatory Visit: Payer: No Typology Code available for payment source

## 2018-12-19 ENCOUNTER — Ambulatory Visit: Payer: No Typology Code available for payment source | Admitting: Nurse Practitioner

## 2019-01-02 ENCOUNTER — Other Ambulatory Visit: Payer: No Typology Code available for payment source

## 2019-01-02 ENCOUNTER — Ambulatory Visit: Payer: No Typology Code available for payment source | Admitting: Oncology

## 2019-01-06 ENCOUNTER — Telehealth: Payer: Self-pay | Admitting: Oncology

## 2019-01-06 NOTE — Telephone Encounter (Signed)
[facility] PAL moved 9/18 appointments to 9/11 with LT per GBS. Left message. Schedule mailed.

## 2019-01-16 ENCOUNTER — Other Ambulatory Visit: Payer: No Typology Code available for payment source

## 2019-01-16 ENCOUNTER — Inpatient Hospital Stay: Payer: No Typology Code available for payment source | Admitting: Nurse Practitioner

## 2019-01-23 ENCOUNTER — Telehealth: Payer: Self-pay | Admitting: Oncology

## 2019-01-23 ENCOUNTER — Other Ambulatory Visit: Payer: No Typology Code available for payment source

## 2019-01-23 ENCOUNTER — Ambulatory Visit: Payer: No Typology Code available for payment source | Admitting: Oncology

## 2019-01-23 NOTE — Telephone Encounter (Signed)
Patient came in wanting to know why his appointment had been cancelled. I explained why and rescheduled.

## 2019-02-09 ENCOUNTER — Other Ambulatory Visit: Payer: Self-pay

## 2019-02-09 ENCOUNTER — Inpatient Hospital Stay: Payer: PRIVATE HEALTH INSURANCE

## 2019-02-09 ENCOUNTER — Encounter (HOSPITAL_COMMUNITY): Payer: Self-pay

## 2019-02-09 ENCOUNTER — Emergency Department (HOSPITAL_COMMUNITY): Payer: PRIVATE HEALTH INSURANCE

## 2019-02-09 ENCOUNTER — Inpatient Hospital Stay (HOSPITAL_COMMUNITY)
Admission: EM | Admit: 2019-02-09 | Discharge: 2019-02-14 | DRG: 640 | Disposition: A | Discharge status: Other Acute Inpatient Hospital | Payer: PRIVATE HEALTH INSURANCE | Attending: Internal Medicine | Admitting: Internal Medicine

## 2019-02-09 ENCOUNTER — Telehealth: Payer: Self-pay | Admitting: Oncology

## 2019-02-09 ENCOUNTER — Inpatient Hospital Stay: Payer: PRIVATE HEALTH INSURANCE | Attending: Oncology | Admitting: Oncology

## 2019-02-09 DIAGNOSIS — Z87891 Personal history of nicotine dependence: Secondary | ICD-10-CM

## 2019-02-09 DIAGNOSIS — Z934 Other artificial openings of gastrointestinal tract status: Secondary | ICD-10-CM

## 2019-02-09 DIAGNOSIS — Z923 Personal history of irradiation: Secondary | ICD-10-CM

## 2019-02-09 DIAGNOSIS — J9811 Atelectasis: Secondary | ICD-10-CM | POA: Diagnosis present

## 2019-02-09 DIAGNOSIS — D649 Anemia, unspecified: Secondary | ICD-10-CM | POA: Diagnosis present

## 2019-02-09 DIAGNOSIS — E43 Unspecified severe protein-calorie malnutrition: Secondary | ICD-10-CM | POA: Diagnosis present

## 2019-02-09 DIAGNOSIS — R188 Other ascites: Secondary | ICD-10-CM | POA: Diagnosis present

## 2019-02-09 DIAGNOSIS — Z85028 Personal history of other malignant neoplasm of stomach: Secondary | ICD-10-CM

## 2019-02-09 DIAGNOSIS — K2101 Gastro-esophageal reflux disease with esophagitis, with bleeding: Secondary | ICD-10-CM | POA: Diagnosis present

## 2019-02-09 DIAGNOSIS — K2970 Gastritis, unspecified, without bleeding: Secondary | ICD-10-CM | POA: Diagnosis present

## 2019-02-09 DIAGNOSIS — R9431 Abnormal electrocardiogram [ECG] [EKG]: Secondary | ICD-10-CM | POA: Diagnosis present

## 2019-02-09 DIAGNOSIS — R627 Adult failure to thrive: Secondary | ICD-10-CM | POA: Diagnosis present

## 2019-02-09 DIAGNOSIS — Z66 Do not resuscitate: Secondary | ICD-10-CM | POA: Diagnosis present

## 2019-02-09 DIAGNOSIS — D638 Anemia in other chronic diseases classified elsewhere: Secondary | ICD-10-CM | POA: Diagnosis present

## 2019-02-09 DIAGNOSIS — Z23 Encounter for immunization: Secondary | ICD-10-CM

## 2019-02-09 DIAGNOSIS — D72819 Decreased white blood cell count, unspecified: Secondary | ICD-10-CM | POA: Diagnosis present

## 2019-02-09 DIAGNOSIS — E876 Hypokalemia: Principal | ICD-10-CM | POA: Diagnosis present

## 2019-02-09 DIAGNOSIS — K219 Gastro-esophageal reflux disease without esophagitis: Secondary | ICD-10-CM | POA: Diagnosis present

## 2019-02-09 DIAGNOSIS — R933 Abnormal findings on diagnostic imaging of other parts of digestive tract: Secondary | ICD-10-CM

## 2019-02-09 DIAGNOSIS — E119 Type 2 diabetes mellitus without complications: Secondary | ICD-10-CM | POA: Diagnosis present

## 2019-02-09 DIAGNOSIS — Z515 Encounter for palliative care: Secondary | ICD-10-CM

## 2019-02-09 DIAGNOSIS — E870 Hyperosmolality and hypernatremia: Secondary | ICD-10-CM | POA: Diagnosis present

## 2019-02-09 DIAGNOSIS — Z9221 Personal history of antineoplastic chemotherapy: Secondary | ICD-10-CM

## 2019-02-09 DIAGNOSIS — E860 Dehydration: Secondary | ICD-10-CM | POA: Diagnosis present

## 2019-02-09 DIAGNOSIS — R6881 Early satiety: Secondary | ICD-10-CM

## 2019-02-09 DIAGNOSIS — Z8249 Family history of ischemic heart disease and other diseases of the circulatory system: Secondary | ICD-10-CM

## 2019-02-09 DIAGNOSIS — Z903 Acquired absence of stomach [part of]: Secondary | ICD-10-CM

## 2019-02-09 DIAGNOSIS — Z7984 Long term (current) use of oral hypoglycemic drugs: Secondary | ICD-10-CM

## 2019-02-09 DIAGNOSIS — R0689 Other abnormalities of breathing: Secondary | ICD-10-CM | POA: Diagnosis present

## 2019-02-09 DIAGNOSIS — C169 Malignant neoplasm of stomach, unspecified: Secondary | ICD-10-CM

## 2019-02-09 DIAGNOSIS — Z7189 Other specified counseling: Secondary | ICD-10-CM

## 2019-02-09 DIAGNOSIS — Z20828 Contact with and (suspected) exposure to other viral communicable diseases: Secondary | ICD-10-CM | POA: Diagnosis present

## 2019-02-09 DIAGNOSIS — Z79899 Other long term (current) drug therapy: Secondary | ICD-10-CM

## 2019-02-09 HISTORY — DX: Malignant neoplasm of stomach, unspecified: C16.9

## 2019-02-09 LAB — COMPREHENSIVE METABOLIC PANEL
ALT: 18 U/L (ref 0–44)
AST: 29 U/L (ref 15–41)
Albumin: 1.8 g/dL — ABNORMAL LOW (ref 3.5–5.0)
Alkaline Phosphatase: 110 U/L (ref 38–126)
Anion gap: 9 (ref 5–15)
BUN: 16 mg/dL (ref 8–23)
CO2: 39 mmol/L — ABNORMAL HIGH (ref 22–32)
Calcium: 6.9 mg/dL — ABNORMAL LOW (ref 8.9–10.3)
Chloride: 98 mmol/L (ref 98–111)
Creatinine, Ser: 0.78 mg/dL (ref 0.61–1.24)
GFR calc Af Amer: >60 mL/min (ref >60)
GFR calc non Af Amer: >60 mL/min (ref >60)
Glucose, Bld: 109 mg/dL — ABNORMAL HIGH (ref 70–99)
Potassium: 2.1 mmol/L — CL (ref 3.5–5.1)
Sodium: 146 mmol/L — ABNORMAL HIGH (ref 135–145)
Total Bilirubin: 0.4 mg/dL (ref 0.3–1.2)
Total Protein: 4.7 g/dL — ABNORMAL LOW (ref 6.5–8.1)

## 2019-02-09 LAB — CBC WITH DIFFERENTIAL/PLATELET
Abs Immature Granulocytes: 0.01 10*3/uL (ref 0.00–0.07)
Basophils Absolute: 0 10*3/uL (ref 0.0–0.1)
Basophils Relative: 0 %
Eosinophils Absolute: 0 10*3/uL (ref 0.0–0.5)
Eosinophils Relative: 0 %
HCT: 29.9 % — ABNORMAL LOW (ref 39.0–52.0)
Hemoglobin: 9.7 g/dL — ABNORMAL LOW (ref 13.0–17.0)
Immature Granulocytes: 0 %
Lymphocytes Relative: 14 %
Lymphs Abs: 0.5 10*3/uL — ABNORMAL LOW (ref 0.7–4.0)
MCH: 28.3 pg (ref 26.0–34.0)
MCHC: 32.4 g/dL (ref 30.0–36.0)
MCV: 87.2 fL (ref 80.0–100.0)
Monocytes Absolute: 0.2 10*3/uL (ref 0.1–1.0)
Monocytes Relative: 6 %
Neutro Abs: 2.8 10*3/uL (ref 1.7–7.7)
Neutrophils Relative %: 80 %
Platelets: 196 10*3/uL (ref 150–400)
RBC: 3.43 MIL/uL — ABNORMAL LOW (ref 4.22–5.81)
RDW: 15.1 % (ref 11.5–15.5)
WBC: 3.5 10*3/uL — ABNORMAL LOW (ref 4.0–10.5)
nRBC: 0 % (ref 0.0–0.2)

## 2019-02-09 LAB — SARS CORONAVIRUS 2 BY RT PCR (HOSPITAL ORDER, PERFORMED IN ~~LOC~~ HOSPITAL LAB): SARS Coronavirus 2: NEGATIVE

## 2019-02-09 LAB — PHOSPHORUS: Phosphorus: 1.5 mg/dL — ABNORMAL LOW (ref 2.5–4.6)

## 2019-02-09 LAB — BRAIN NATRIURETIC PEPTIDE: B Natriuretic Peptide: 101 pg/mL — ABNORMAL HIGH (ref 0.0–100.0)

## 2019-02-09 LAB — MAGNESIUM: Magnesium: 2 mg/dL (ref 1.7–2.4)

## 2019-02-09 MED ORDER — INFLUENZA VAC SPLIT QUAD 0.5 ML IM SUSY
0.5000 mL | PREFILLED_SYRINGE | INTRAMUSCULAR | Status: AC
  Administered 2019-02-12: 10:00:00 0.5 mL via INTRAMUSCULAR
  Filled 2019-02-09: qty 0.5

## 2019-02-09 MED ORDER — CALCIUM GLUCONATE-NACL 1-0.675 GM/50ML-% IV SOLN
1.0000 g | Freq: Once | INTRAVENOUS | Status: AC
  Administered 2019-02-09: 1000 mg via INTRAVENOUS

## 2019-02-09 MED ORDER — K PHOS MONO-SOD PHOS DI & MONO 155-852-130 MG PO TABS
500.0000 mg | ORAL_TABLET | Freq: Three times a day (TID) | ORAL | Status: AC
  Administered 2019-02-09 – 2019-02-10 (×3): 500 mg via ORAL
  Filled 2019-02-09 (×6): qty 2

## 2019-02-09 MED ORDER — POTASSIUM CHLORIDE IN NACL 20-0.45 MEQ/L-% IV SOLN
INTRAVENOUS | Status: DC
  Administered 2019-02-09 – 2019-02-11 (×3): via INTRAVENOUS
  Filled 2019-02-09 (×4): qty 1000

## 2019-02-09 MED ORDER — ACETAMINOPHEN 650 MG RE SUPP: 650.0000 mg | Freq: Four times a day (QID) | RECTAL | Status: DC | PRN

## 2019-02-09 MED ORDER — POTASSIUM CHLORIDE CRYS ER 20 MEQ PO TBCR: 20.0000 meq | EXTENDED_RELEASE_TABLET | Freq: Two times a day (BID) | ORAL | Status: DC

## 2019-02-09 MED ORDER — MAGNESIUM SULFATE IN D5W 1-5 GM/100ML-% IV SOLN
1.0000 g | Freq: Once | INTRAVENOUS | Status: AC
  Administered 2019-02-09: 21:00:00 1 g via INTRAVENOUS
  Filled 2019-02-09: qty 100

## 2019-02-09 MED ORDER — POTASSIUM CHLORIDE CRYS ER 20 MEQ PO TBCR
40.0000 meq | EXTENDED_RELEASE_TABLET | Freq: Once | ORAL | Status: AC
  Administered 2019-02-09: 20:00:00 40 meq via ORAL
  Filled 2019-02-09: qty 2

## 2019-02-09 MED ORDER — ENSURE ENLIVE PO LIQD: 237.0000 mL | Freq: Two times a day (BID) | ORAL | Status: DC

## 2019-02-09 MED ORDER — CHLORHEXIDINE GLUCONATE CLOTH 2 % EX PADS
6.0000 | MEDICATED_PAD | Freq: Every day | CUTANEOUS | Status: DC
  Administered 2019-02-11 – 2019-02-12 (×2): 6 via TOPICAL

## 2019-02-09 MED ORDER — CHLORHEXIDINE GLUCONATE CLOTH 2 % EX PADS
6.0000 | MEDICATED_PAD | Freq: Every day | CUTANEOUS | Status: DC
  Administered 2019-02-10 – 2019-02-14 (×4): 6 via TOPICAL

## 2019-02-09 MED ORDER — SODIUM CHLORIDE 0.9 % IV SOLN
1.0000 g | Freq: Once | INTRAVENOUS | Status: DC
  Filled 2019-02-09: qty 10

## 2019-02-09 MED ORDER — POTASSIUM CHLORIDE 10 MEQ/100ML IV SOLN
10.0000 meq | INTRAVENOUS | Status: AC
  Administered 2019-02-09 – 2019-02-10 (×4): 10 meq via INTRAVENOUS
  Filled 2019-02-09 (×4): qty 100

## 2019-02-09 MED ORDER — ACETAMINOPHEN 325 MG PO TABS: 650.0000 mg | ORAL_TABLET | Freq: Four times a day (QID) | ORAL | Status: DC | PRN

## 2019-02-09 MED ORDER — CALCIUM GLUCONATE-NACL 1-0.675 GM/50ML-% IV SOLN
INTRAVENOUS | Status: AC
  Filled 2019-02-09: qty 50

## 2019-02-09 NOTE — ED Provider Notes (Signed)
[facility] EMERGENCY DEPARTMENT Provider Note   CSN: [id number] Arrival date & time: 02/09/19  1637     History   Chief Complaint Chief Complaint  Patient presents with  . Loss of Consciousness    HPI Troy Barrera is a 63 y.o. 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  Patient states he had a productive cough of clear to yellow-colored sputum production that is very irritating.  He states he cannot clear the mucus from his chest.  With this he has had chills as well as generalized weakness.  He states that he is not necessarily short of breath but that his breathing feels weak/shallow.  He states that at baseline he typically does not ambulate/move around unless it is completely necessary and when he does he utilizes a cane.  He states that today he tried to get out of bed and could not, he stood up and then fell back into the bed secondary to weakness.Marland Kitchen  He personally denies any dizziness, lightheadedness, or syncope episodes.  However per EMS daughter went to check on him today and when he stood up he passed out.  He has had poor p.o. intake.  Patient denies fever, nausea, vomiting, chest pain, lightheadedness, dizziness, or unilateral leg pain/swelling.  Patient states he lives at home alone.     HPI  Past Medical History:  Diagnosis Date  . Cancer [facility])    gastric cancer  . Diabetes mellitus without complication ([place])   . GERD (gastroesophageal reflux disease)   . Stomach cancer ([place]) 01/28/15   gastric adenocarcinoma    Patient Active Problem List   Diagnosis Date Noted  . Encounter for chemotherapy management 03/23/2015  . Cancer of greater curvature of stomach ([place]) 02/11/2015  . Diabetes mellitus without complication ([place])   . Gastric cancer  s/p subtotal gastrectomy 01/28/15 01/28/2015    Past Surgical History:  Procedure Laterality Date  . NO PAST SURGERIES    . PARTIAL GASTRECTOMY N/A 01/28/2015   Procedure: PARTIAL GASTRECTOMY;  Surgeon: Jackolyn Confer, MD;  Location: [facility] ORS;  Service: General;  Laterality: N/A;        Home Medications    Prior to Admission medications   Medication Sig Start Date End Date Taking? Authorizing Provider  ENSURE PLUS (ENSURE PLUS) LIQD Take 237 mLs by mouth 2 (two) times daily between meals.    [provider]  glipiZIDE (GLUCOTROL XL) 5 MG 24 hr tablet Reported on 06/17/2015 10/20/14   [provider]  metoCLOPramide (REGLAN) 10 MG tablet TAKE (1) TABLET BY MOUTH (3) TIMES DAILY BEFORE MEALS. 07/11/18   Ladell Pier, MD  Multiple Vitamin (MULTIVITAMIN) tablet Take 1 tablet by mouth daily. megamend    [provider]  pantoprazole (PROTONIX) 40 MG tablet TAKE 1 TABLET BY MOUTH DAILY. 08/05/18   Ladell Pier, MD  potassium chloride SA (K-DUR,KLOR-CON) 20 MEQ tablet Take 1 tablet (20 mEq total) by mouth 2 (two) times daily. 07/11/18   Ladell Pier, MD    Family History Family History  Problem Relation Age of Onset  . Hypertension Mother   . Heart disease Father     Social History Social History   Tobacco Use  . Smoking status: Former Smoker    Packs/day: 0.25    Years:  30.00    Pack years: 7.50    Types: Cigarettes    Quit date: 01/05/2015    Years since quitting: 4.0  . Smokeless tobacco: Never Used  Substance Use Topics  . Alcohol use: Yes    Comment: beer weekends- none in last 4 months  . Drug use: No     Allergies   Patient has no known allergies.   Review of Systems Review of Systems  Constitutional: Negative for chills and fever.  HENT: Negative for congestion, ear pain and sore throat.   Eyes: Negative for visual disturbance.  Respiratory: Positive for cough.   Cardiovascular: Negative for chest pain and leg swelling.   Gastrointestinal: Negative for abdominal pain, diarrhea and vomiting.  Neurological: Positive for syncope (per EMS, negative per patient) and weakness. Negative for dizziness, light-headedness, numbness and headaches.  All other systems reviewed and are negative.   Physical Exam Updated Vital Signs BP 105/78 (BP Location: Right Arm)   Pulse 60   Temp 97.9 F (36.6 C) (Oral)   Resp (!) 22   Ht 5\' 10"  (1.778 m)   Wt 49.9 kg   SpO2 93%   BMI 15.78 kg/m   Physical Exam Vitals signs and nursing note reviewed.  Constitutional:      Appearance: He is not toxic-appearing.  HENT:     Head: Normocephalic and atraumatic.  Eyes:     General:        Right eye: No discharge.        Left eye: No discharge.     Conjunctiva/sclera: Conjunctivae normal.  Neck:     Musculoskeletal: Neck supple.  Cardiovascular:     Rate and Rhythm: Normal rate and regular rhythm.  Pulmonary:     Effort: No accessory muscle usage.     Comments: Course breath sounds throughout, worse @ the bases.  Wet cough throughout assessment.  Abdominal:     General: There is no distension.     Palpations: Abdomen is soft.     Tenderness: There is no abdominal tenderness. There is no guarding or rebound.  Musculoskeletal:     Comments: 1-2+ symmetric pitting edema to the lower legs w/o calf tenderness.   Skin:    General: Skin is warm and dry.     Findings: No rash.  Neurological:     Mental Status: He is alert.     Comments: Clear speech.   Psychiatric:        Behavior: Behavior normal.      ED Treatments / Results  Labs (all labs ordered are listed, but only abnormal results are displayed) Labs Reviewed  COMPREHENSIVE METABOLIC PANEL - Abnormal; Notable for the following components:      Result Value   Sodium 146 (*)    Potassium 2.1 (*)    CO2 39 (*)    Glucose, Bld 109 (*)    Calcium 6.9 (*)    Total Protein 4.7 (*)    Albumin 1.8 (*)    All other components within normal limits  CBC WITH  DIFFERENTIAL/PLATELET - Abnormal; Notable for the following components:   WBC 3.5 (*)    RBC 3.43 (*)    Hemoglobin 9.7 (*)    HCT 29.9 (*)    Lymphs Abs 0.5 (*)    All other components within normal limits  BRAIN NATRIURETIC PEPTIDE - Abnormal; Notable for the following components:   B Natriuretic Peptide 101.0 (*)    All other components within normal limits  SARS  CORONAVIRUS 2 (HOSPITAL ORDER, [place] [facility])  URINALYSIS, ROUTINE W REFLEX MICROSCOPIC  MAGNESIUM  PHOSPHORUS  HIV ANTIBODY (ROUTINE TESTING W REFLEX)  HIV4GL SAVE TUBE  CBC  COMPREHENSIVE METABOLIC PANEL  VITAMIN 123456  FOLATE  IRON AND TIBC  FERRITIN  RETICULOCYTES  HEMOGLOBIN A1C    EKG EKG Interpretation  Date/Time:  Monday February 09 2019 18:12:10 EDT Ventricular Rate:  61 PR Interval:    QRS Duration: 93 QT Interval:  624 QTC Calculation: 629 R Axis:   -74 Text Interpretation:  Sinus rhythm Short PR interval Left anterior fascicular block Low voltage, extremity and precordial leads Abnormal R-wave progression, early transition Nonspecific T abnrm, anterolateral leads Minimal ST elevation, lateral leads Prolonged QT interval Baseline wander in lead(s) V6 Confirmed by Gerlene Fee [phone]) on 02/09/2019 7:12:09 PM   Radiology Dg Chest Portable 1 View  Result Date: 02/09/2019 CLINICAL DATA:  Witnessed syncopal episode today. EXAM: PORTABLE CHEST 1 VIEW COMPARISON:  None. FINDINGS: Heart size and mediastinal contours are within normal limits. Vague opacity at the LEFT lung base, suspected small layering pleural effusion. Lungs otherwise clear. No pneumothorax seen. Osseous structures about the chest are unremarkable. IMPRESSION: 1. Vague opacity at the LEFT lung base, suspected small layering pleural effusion. 2. Lungs otherwise clear. No evidence of pneumonia or pulmonary edema. Electronically Signed   By: Franki Cabot M.D.   On: 02/09/2019 19:00    Procedures .Critical Care  Performed by: Amaryllis Dyke, PA-C Authorized by: Amaryllis Dyke, PA-C     CRITICAL CARE Performed by: Kennith Maes   Total critical care time: 30 minutes  Critical care time was exclusive of separately billable procedures and treating other patients.  Critical care was necessary to treat or prevent imminent or life-threatening deterioration.  Critical care was time spent personally by me on the following activities: development of treatment plan with patient and/or surrogate as well as nursing, discussions with consultants, evaluation of patient's response to treatment, examination of patient, obtaining history from patient or surrogate, ordering and performing treatments and interventions, ordering and review of laboratory studies, ordering and review of radiographic studies, pulse oximetry and re-evaluation of patient's condition. (including critical care time)  Medications Ordered in ED Medications  potassium chloride 10 mEq in 100 mL IVPB (10 mEq Intravenous New Bag/Given 02/09/19 2040)  magnesium sulfate IVPB 1 g 100 mL (1 g Intravenous New Bag/Given 02/09/19 2037)  calcium gluconate 1 g in sodium chloride 0.9 % 100 mL IVPB (has no administration in time range)  0.45 % NaCl with KCl 20 mEq / L infusion (has no administration in time range)  acetaminophen (TYLENOL) tablet 650 mg (has no administration in time range)    Or  acetaminophen (TYLENOL) suppository 650 mg (has no administration in time range)  potassium chloride SA (KLOR-CON) CR tablet 40 mEq (40 mEq Oral Given 02/09/19 2025)     Initial Impression / Assessment and Plan / ED Course  I have reviewed the triage vital signs and the nursing notes.  Pertinent labs & imaging results that were available during my care of the patient were reviewed by me and considered in my medical decision making (see chart for details).   Patient presents to the emergency department via EMS with complaints of  generalized weakness, cough, and chills for the past few weeks, there was question of syncope by EMS, patient states he was more too weak to get up and fell back onto his bed.  He is  nontoxic-appearing, resting fairly comfortably, his pressures are soft.  On exam he has coarse breath sounds as well as pitting edema to the bilateral lower extremities.  Plan for basic labs, chest x-ray, and EKG.  Will attempt orthostatic vitals if patient is able to tolerate.  CBC: Leukopenia which has been present previously, anemia which is worse from prior, no plt dysfunction CMP: Critical hypokalemia @ 2.1. Hypocalcemic @ 6.9. Bicarb elevated. Hypoalbuminemia. Renal function WNL BNP: mildly elevated @ 101 CXR: IMPRESSION: 1. Vague opacity at the LEFT lung base, suspected small layering pleural effusion. 2. Lungs otherwise clear. No evidence of pneumonia or pulmonary edema  Patient critically hypokalemic @ 2.1, also hypocalcemic, QTc 629--> will replace electrolytes and consult for admission.   20:17: CONSULT: Discussed w/ hospitalist Dr. Olevia Bowens who accepts admission,.    Findings and plan of care discussed with supervising physician Dr. Sedonia Small who has evaluated patient & is in agreement.    Troy Barrera was evaluated in Emergency Department on 02/09/2019 for the symptoms described in the history of present illness. He/she was evaluated in the context of the global COVID-19 pandemic, which necessitated consideration that the patient might be at risk for infection with the SARS-CoV-2 virus that causes COVID-19. Institutional protocols and algorithms that pertain to the evaluation of patients at risk for COVID-19 are in a state of rapid change based on information released by regulatory bodies including the CDC and federal and state organizations. These policies and algorithms were followed during the patient's care in the ED.  Final Clinical Impressions(s) / ED Diagnoses   Final diagnoses:  Hypokalemia    ED  Discharge Orders    None       Amaryllis Dyke, PA-C 02/09/19 2051    Maudie Flakes, MD 02/09/19 2135

## 2019-02-09 NOTE — ED Notes (Signed)
CRITICAL VALUE ALERT  Critical Value:  K+ 2.1  Date & Time Notied:  02/09/2019 1957  Provider Notified: Dr. Sedonia Small  Orders Received/Actions taken: see chart

## 2019-02-09 NOTE — ED Notes (Signed)
Pt given warm blanket.

## 2019-02-09 NOTE — ED Triage Notes (Signed)
EMS reports pt had witnessed syncopal episode today.  Pt missed an appt today and daughter went to check on him.  When pt stood up, he passed out.  Reports cough x couple of days.  EMS says lung sounds clear.  EMS says pt was hypotensive and they gave him 551ml ns.  PT c/o feeling cold.  CBG 150 per ems.  bp 90/56 after bolus.

## 2019-02-09 NOTE — ED Notes (Signed)
Pt is aware we need urine sample.  

## 2019-02-09 NOTE — Telephone Encounter (Signed)
Called pt per 10/5 sch message - no answer - left message for patient to call back and r/s appt

## 2019-02-09 NOTE — H&P (Signed)
History and Physical    [name] [id number] DOB: 1955-08-31 DOA: 02/09/2019  PCP: [name], MD   Patient coming from: Home.  I have personally briefly reviewed patient's old medical records in [place]  Chief Complaint: Weakness and chills.  HPI: Troy Barrera is a 63 y.o. male with medical history significant of gastric cancer, type 2 diabetes, GERD who is coming to the emergency department via EMS after having progressively worse weakness for the past few weeks, but particularly has felt weak over the weekend.  He says that he has been having trouble to walk across the room due to weakness.  He denies fever, but complains of chills/feeling cold.  No headache, sore throat or rhinorrhea.  His appetite is decreased, but denies abdominal pain, nausea or emesis, diarrhea, constipation, melena or hematochezia.  He has lost another 15 pounds of weight in the past few months.  He complains of mild dyspnea, but denies wheezing, productive cough or hemoptysis.  No chest pain, palpitations, diaphoresis, dizziness, but EMS earlier today described him as hypotensive and near syncopal.  His BP after a 500 mL bolus given by EMS was only 90/56 mmHg.  He has mild lower extremity edema, but denies PND or orthopnea.  No dysuria, frequency or hematuria.  No polyuria, polydipsia, polyphagia or blurred vision.  ED Course: Initial vital signs temperature 97.9 F, pulse 60, respirations 22, blood pressure 105 to 78 mmHg and O2 sat 93% on room air.  The patient received electrolyte replacement in the emergency department. His white count is 3.5, hemoglobin 9.7 g/dL and platelets 196.  Sodium was 146, potassium 2.1, chloride 98 and CO2 39 mmol/L.  Glucose 109, BUN 16, creatinine 0.78, magnesium 2.0, phosphorus 1.5 and calcium 6.9 mg/dL.  Total protein was 4.7 and albumin 1.8 g/dL.  The rest of the hepatic function tests are within normal limits. His chest radiograph showed a vague opacity in the left  lung base, which is questionable for small layering pleural effusion.  Otherwise lungs are clear without evidence of edema or pneumonia.  Review of Systems: As per HPI otherwise 10 point review of systems negative.   Past Medical History:  Diagnosis Date   Cancer [facility])    gastric cancer   Diabetes mellitus without complication ([place])    GERD (gastroesophageal reflux disease)    Stomach cancer ([place]) 01/28/15   gastric adenocarcinoma    Past Surgical History:  Procedure Laterality Date   NO PAST SURGERIES     PARTIAL GASTRECTOMY N/A 01/28/2015   Procedure: PARTIAL GASTRECTOMY;  Surgeon: Jackolyn Confer, MD;  Location: [facility] ORS;  Service: General;  Laterality: N/A;     reports that he quit smoking about 4 years ago. His smoking use included cigarettes. He has a 7.50 pack-year smoking history. He has never used smokeless tobacco. He reports current alcohol use. He reports that he does not use drugs.  No Known Allergies  Family History  Problem Relation Age of Onset   Hypertension Mother    Heart disease Father    Prior to Admission medications   Medication Sig Start Date End Date Taking? Authorizing Provider  metoCLOPramide (REGLAN) 10 MG tablet TAKE (1) TABLET BY MOUTH (3) TIMES DAILY BEFORE MEALS. Patient taking differently: Take 10 mg by mouth 3 (three) times daily before meals.  07/11/18  Yes Ladell Pier, MD  pantoprazole (PROTONIX) 40 MG tablet TAKE 1 TABLET BY MOUTH DAILY. Patient taking differently: Take 40 mg by mouth daily.  08/05/18  Yes Ladell Pier, MD  potassium chloride SA (K-DUR,KLOR-CON) 20 MEQ tablet Take 1 tablet (20 mEq total) by mouth 2 (two) times daily. 07/11/18  Yes Ladell Pier, MD  ENSURE PLUS (ENSURE PLUS) LIQD Take 237 mLs by mouth 2 (two) times daily between meals.    [provider]  Multiple Vitamin (MULTIVITAMIN) tablet Take 1 tablet by mouth daily. megamend    [provider]    Physical Exam: Vitals:   02/09/19 1900  02/09/19 1915 02/09/19 1930 02/09/19 2000  BP: 123/81  (!) 114/91 129/88  Pulse: (!) 59 60 68 64  Resp: (!) 27 17 (!) 22 20  Temp:      TempSrc:      SpO2: 100% 99% 98% 98%  Weight:      Height:        Constitutional: Cachectic, but in NAD, calm, comfortable Eyes: PERRL, lids and conjunctivae are mildly injected. ENMT: Mucous membranes are mildly dry. Posterior pharynx clear of any exudate or lesions. Neck: normal, supple, no masses, no thyromegaly Respiratory: clear to auscultation bilaterally, no wheezing, no crackles. Normal respiratory effort.  No accessory muscle use.  Cardiovascular: Regular rate and rhythm, no murmurs / rubs / gallops. No extremity edema. 2+ pedal pulses. No carotid bruits.  Abdomen: Soft, no tenderness, no masses palpated. No hepatosplenomegaly. Bowel sounds positive.  Musculoskeletal: no clubbing / cyanosis. Good ROM, no contractures. Normal muscle tone.  Skin: no rashes, lesions, ulcers on very limited dermatological examination. Neurologic: CN 2-12 grossly intact. Sensation intact, DTR normal. Strength 5/5 in all 4.  Psychiatric: Normal judgment and insight. Alert and oriented x 3. Normal mood.   Labs on Admission: I have personally reviewed following labs and imaging studies  CBC: Recent Labs  Lab 02/09/19 1845  WBC 3.5*  NEUTROABS 2.8  HGB 9.7*  HCT 29.9*  MCV 87.2  PLT 123456   Basic Metabolic Panel: Recent Labs  Lab 02/09/19 1845  NA 146*  K 2.1*  CL 98  CO2 39*  GLUCOSE 109*  BUN 16  CREATININE 0.78  CALCIUM 6.9*   GFR: Estimated Creatinine Clearance: 67.6 mL/min (by C-G formula based on SCr of 0.78 mg/dL). Liver Function Tests: Recent Labs  Lab 02/09/19 1845  AST 29  ALT 18  ALKPHOS 110  BILITOT 0.4  PROT 4.7*  ALBUMIN 1.8*   No results for input(s): LIPASE, AMYLASE in the last 168 hours. No results for input(s): AMMONIA in the last 168 hours. Coagulation Profile: No results for input(s): INR, PROTIME in the last 168  hours. Cardiac Enzymes: No results for input(s): CKTOTAL, CKMB, CKMBINDEX, TROPONINI in the last 168 hours. BNP (last 3 results) No results for input(s): PROBNP in the last 8760 hours. HbA1C: No results for input(s): HGBA1C in the last 72 hours. CBG: No results for input(s): GLUCAP in the last 168 hours. Lipid Profile: No results for input(s): CHOL, HDL, LDLCALC, TRIG, CHOLHDL, LDLDIRECT in the last 72 hours. Thyroid Function Tests: No results for input(s): TSH, T4TOTAL, FREET4, T3FREE, THYROIDAB in the last 72 hours. Anemia Panel: No results for input(s): VITAMINB12, FOLATE, FERRITIN, TIBC, IRON, RETICCTPCT in the last 72 hours. Urine analysis: No results found for: COLORURINE, APPEARANCEUR, LABSPEC, [place], GLUCOSEU, HGBUR, BILIRUBINUR, KETONESUR, PROTEINUR, UROBILINOGEN, NITRITE, LEUKOCYTESUR  Radiological Exams on Admission: Dg Chest Portable 1 View  Result Date: 02/09/2019 CLINICAL DATA:  Witnessed syncopal episode today. EXAM: PORTABLE CHEST 1 VIEW COMPARISON:  None. FINDINGS: Heart size and mediastinal contours are within normal limits.  Vague opacity at the LEFT lung base, suspected small layering pleural effusion. Lungs otherwise clear. No pneumothorax seen. Osseous structures about the chest are unremarkable. IMPRESSION: 1. Vague opacity at the LEFT lung base, suspected small layering pleural effusion. 2. Lungs otherwise clear. No evidence of pneumonia or pulmonary edema. Electronically Signed   By: Franki Cabot M.D.   On: 02/09/2019 19:00    EKG: Independently reviewed.  Vent. rate 61 BPM PR interval * ms QRS duration 93 ms QT/QTc 624/629 ms P-R-T axes 124 -74 94 Sinus rhythm Short PR interval Left anterior fascicular block Low voltage, extremity and precordial leads Abnormal R-wave progression, early transition Nonspecific T abnrm, anterolateral leads Minimal ST elevation, lateral leads Prolonged QT interval Baseline wander in lead(s)  V6  Assessment/Plan Principal Problem:   Hypokalemia Observation/stepdown. Continue potassium replacement. Follow-up potassium level. Follow-up EKG in a.m.  Active Problems:   Prolonged QT interval Close cardio monitoring in the ICU. Received magnesium sulfate. Received calcium gluconate. Continue electrolyte replacement. Follow-up EKG and lites in the morning    Hypercarbia Likely due to volume contraction. However, follow-up closely in the presence of hypokalemia.    Hypernatremia Likely due to mild free water deficiency. Continue IV hydration with half NS    Hypophosphatemia Mildly hypocalcemic when calcium corrected to albumin. Replacing orally. Follow-up phosphorus level as needed. Recheck calcium in a.m. Check PTH/vitamin D if still low.    Diabetes mellitus without complication (HCC) Carbohydrate modified diet. Check hemoglobin A1c. CBG monitoring before meals.    GERD (gastroesophageal reflux disease) No significant symptoms at this time. No famotidine or PPI to avoid further QT prolongation.    Anemia Check anemia panel. Monitor H&H.    Leukopenia Follow WBC count. Follow-up with hematology oncology.   DVT prophylaxis: SCDs. Code Status: Full code. Family Communication: His daughter was present in the room. Disposition Plan: Observation in SDU for close electrocardiographic monitoring and K replacement. Consults called:  Admission status: Observation/SDU.   Reubin Milan MD [facility]  If 7PM-7AM, please contact night-coverage  02/09/2019, 8:34 PM   This document was prepared using Dragon voice recognition software and may contain some unintended transcription errors.

## 2019-02-10 DIAGNOSIS — Z66 Do not resuscitate: Secondary | ICD-10-CM | POA: Diagnosis present

## 2019-02-10 DIAGNOSIS — Z7189 Other specified counseling: Secondary | ICD-10-CM | POA: Diagnosis not present

## 2019-02-10 DIAGNOSIS — E870 Hyperosmolality and hypernatremia: Secondary | ICD-10-CM

## 2019-02-10 DIAGNOSIS — R933 Abnormal findings on diagnostic imaging of other parts of digestive tract: Secondary | ICD-10-CM | POA: Diagnosis not present

## 2019-02-10 DIAGNOSIS — C169 Malignant neoplasm of stomach, unspecified: Secondary | ICD-10-CM | POA: Diagnosis not present

## 2019-02-10 DIAGNOSIS — Z20828 Contact with and (suspected) exposure to other viral communicable diseases: Secondary | ICD-10-CM | POA: Diagnosis present

## 2019-02-10 DIAGNOSIS — Z980 Intestinal bypass and anastomosis status: Secondary | ICD-10-CM | POA: Diagnosis not present

## 2019-02-10 DIAGNOSIS — Z515 Encounter for palliative care: Secondary | ICD-10-CM | POA: Diagnosis not present

## 2019-02-10 DIAGNOSIS — E860 Dehydration: Secondary | ICD-10-CM | POA: Diagnosis present

## 2019-02-10 DIAGNOSIS — D638 Anemia in other chronic diseases classified elsewhere: Secondary | ICD-10-CM | POA: Diagnosis present

## 2019-02-10 DIAGNOSIS — E43 Unspecified severe protein-calorie malnutrition: Secondary | ICD-10-CM | POA: Insufficient documentation

## 2019-02-10 DIAGNOSIS — E119 Type 2 diabetes mellitus without complications: Secondary | ICD-10-CM

## 2019-02-10 DIAGNOSIS — R627 Adult failure to thrive: Secondary | ICD-10-CM | POA: Diagnosis present

## 2019-02-10 DIAGNOSIS — Z903 Acquired absence of stomach [part of]: Secondary | ICD-10-CM | POA: Diagnosis not present

## 2019-02-10 DIAGNOSIS — K2101 Gastro-esophageal reflux disease with esophagitis, with bleeding: Secondary | ICD-10-CM | POA: Diagnosis present

## 2019-02-10 DIAGNOSIS — K219 Gastro-esophageal reflux disease without esophagitis: Secondary | ICD-10-CM

## 2019-02-10 DIAGNOSIS — Z7984 Long term (current) use of oral hypoglycemic drugs: Secondary | ICD-10-CM | POA: Diagnosis not present

## 2019-02-10 DIAGNOSIS — R188 Other ascites: Secondary | ICD-10-CM | POA: Diagnosis present

## 2019-02-10 DIAGNOSIS — Z8249 Family history of ischemic heart disease and other diseases of the circulatory system: Secondary | ICD-10-CM | POA: Diagnosis not present

## 2019-02-10 DIAGNOSIS — Z934 Other artificial openings of gastrointestinal tract status: Secondary | ICD-10-CM | POA: Diagnosis not present

## 2019-02-10 DIAGNOSIS — R9431 Abnormal electrocardiogram [ECG] [EKG]: Secondary | ICD-10-CM

## 2019-02-10 DIAGNOSIS — K2970 Gastritis, unspecified, without bleeding: Secondary | ICD-10-CM | POA: Diagnosis present

## 2019-02-10 DIAGNOSIS — R6881 Early satiety: Secondary | ICD-10-CM | POA: Diagnosis not present

## 2019-02-10 DIAGNOSIS — D649 Anemia, unspecified: Secondary | ICD-10-CM

## 2019-02-10 DIAGNOSIS — B3781 Candidal esophagitis: Secondary | ICD-10-CM | POA: Diagnosis not present

## 2019-02-10 DIAGNOSIS — Z923 Personal history of irradiation: Secondary | ICD-10-CM | POA: Diagnosis not present

## 2019-02-10 DIAGNOSIS — Z87891 Personal history of nicotine dependence: Secondary | ICD-10-CM | POA: Diagnosis not present

## 2019-02-10 DIAGNOSIS — D72819 Decreased white blood cell count, unspecified: Secondary | ICD-10-CM | POA: Diagnosis present

## 2019-02-10 DIAGNOSIS — E876 Hypokalemia: Secondary | ICD-10-CM | POA: Diagnosis present

## 2019-02-10 DIAGNOSIS — Z85028 Personal history of other malignant neoplasm of stomach: Secondary | ICD-10-CM | POA: Diagnosis not present

## 2019-02-10 DIAGNOSIS — J9811 Atelectasis: Secondary | ICD-10-CM | POA: Diagnosis present

## 2019-02-10 DIAGNOSIS — Z23 Encounter for immunization: Secondary | ICD-10-CM | POA: Diagnosis not present

## 2019-02-10 LAB — COMPREHENSIVE METABOLIC PANEL
ALT: 17 U/L (ref 0–44)
AST: 27 U/L (ref 15–41)
Albumin: 1.6 g/dL — ABNORMAL LOW (ref 3.5–5.0)
Alkaline Phosphatase: 101 U/L (ref 38–126)
Anion gap: 8 (ref 5–15)
BUN: 16 mg/dL (ref 8–23)
CO2: 36 mmol/L — ABNORMAL HIGH (ref 22–32)
Calcium: 6.9 mg/dL — ABNORMAL LOW (ref 8.9–10.3)
Chloride: 99 mmol/L (ref 98–111)
Creatinine, Ser: 0.77 mg/dL (ref 0.61–1.24)
GFR calc Af Amer: >60 mL/min (ref >60)
GFR calc non Af Amer: >60 mL/min (ref >60)
Glucose, Bld: 76 mg/dL (ref 70–99)
Potassium: 2.9 mmol/L — ABNORMAL LOW (ref 3.5–5.1)
Sodium: 143 mmol/L (ref 135–145)
Total Bilirubin: 0.6 mg/dL (ref 0.3–1.2)
Total Protein: 4.5 g/dL — ABNORMAL LOW (ref 6.5–8.1)

## 2019-02-10 LAB — GLUCOSE, CAPILLARY
Glucose-Capillary: 114 mg/dL — ABNORMAL HIGH (ref 70–99)
Glucose-Capillary: 99 mg/dL (ref 70–99)
Glucose-Capillary: 185 mg/dL — ABNORMAL HIGH (ref 70–99)
Glucose-Capillary: 130 mg/dL — ABNORMAL HIGH (ref 70–99)

## 2019-02-10 LAB — BASIC METABOLIC PANEL
Anion gap: 9 (ref 5–15)
BUN: 17 mg/dL (ref 8–23)
CO2: 36 mmol/L — ABNORMAL HIGH (ref 22–32)
Calcium: 6.9 mg/dL — ABNORMAL LOW (ref 8.9–10.3)
Chloride: 97 mmol/L — ABNORMAL LOW (ref 98–111)
Creatinine, Ser: 0.77 mg/dL (ref 0.61–1.24)
GFR calc Af Amer: >60 mL/min (ref >60)
GFR calc non Af Amer: >60 mL/min (ref >60)
Glucose, Bld: 161 mg/dL — ABNORMAL HIGH (ref 70–99)
Potassium: 3.3 mmol/L — ABNORMAL LOW (ref 3.5–5.1)
Sodium: 142 mmol/L (ref 135–145)

## 2019-02-10 LAB — CBC
HCT: 26.6 % — ABNORMAL LOW (ref 39.0–52.0)
Hemoglobin: 8.8 g/dL — ABNORMAL LOW (ref 13.0–17.0)
MCH: 28.6 pg (ref 26.0–34.0)
MCHC: 33.1 g/dL (ref 30.0–36.0)
MCV: 86.4 fL (ref 80.0–100.0)
Platelets: 169 10*3/uL (ref 150–400)
RBC: 3.08 MIL/uL — ABNORMAL LOW (ref 4.22–5.81)
RDW: 15.1 % (ref 11.5–15.5)
WBC: 9.1 10*3/uL (ref 4.0–10.5)
nRBC: 0 % (ref 0.0–0.2)

## 2019-02-10 LAB — IRON AND TIBC
Iron: 35 ug/dL — ABNORMAL LOW (ref 45–182)
Saturation Ratios: 32 % (ref 17.9–39.5)
TIBC: 109 ug/dL — ABNORMAL LOW (ref 250–450)
UIBC: 74 ug/dL

## 2019-02-10 LAB — MRSA PCR SCREENING: MRSA by PCR: NEGATIVE

## 2019-02-10 LAB — PHOSPHORUS: Phosphorus: 2.6 mg/dL (ref 2.5–4.6)

## 2019-02-10 LAB — VITAMIN B12: Vitamin B-12: 297 pg/mL (ref 180–914)

## 2019-02-10 LAB — HIV ANTIBODY (ROUTINE TESTING W REFLEX): HIV Screen 4th Generation wRfx: NONREACTIVE

## 2019-02-10 LAB — FOLATE: Folate: 8.6 ng/mL (ref >5.9)

## 2019-02-10 LAB — HEMOGLOBIN A1C
Hgb A1c MFr Bld: 5.2 % (ref 4.8–5.6)
Mean Plasma Glucose: 102.54 mg/dL

## 2019-02-10 LAB — RETICULOCYTES
Immature Retic Fract: 4.3 % (ref 2.3–15.9)
RBC.: 3.08 MIL/uL — ABNORMAL LOW (ref 4.22–5.81)
Retic Count, Absolute: 15.4 10*3/uL — ABNORMAL LOW (ref 19.0–186.0)
Retic Ct Pct: 0.5 % (ref 0.4–3.1)

## 2019-02-10 LAB — FERRITIN: Ferritin: 443 ng/mL — ABNORMAL HIGH (ref 24–336)

## 2019-02-10 MED ORDER — ENSURE ENLIVE PO LIQD
237.0000 mL | Freq: Three times a day (TID) | ORAL | Status: DC
  Administered 2019-02-10 – 2019-02-14 (×11): 237 mL via ORAL

## 2019-02-10 MED ORDER — PRO-STAT SUGAR FREE PO LIQD
30.0000 mL | Freq: Two times a day (BID) | ORAL | Status: DC
  Administered 2019-02-10 – 2019-02-14 (×7): 30 mL via ORAL
  Filled 2019-02-10 (×8): qty 30

## 2019-02-10 MED ORDER — POTASSIUM CHLORIDE CRYS ER 20 MEQ PO TBCR
40.0000 meq | EXTENDED_RELEASE_TABLET | Freq: Two times a day (BID) | ORAL | Status: DC
  Administered 2019-02-10 – 2019-02-12 (×6): 40 meq via ORAL
  Filled 2019-02-10 (×6): qty 2

## 2019-02-10 NOTE — TOC Progression Note (Addendum)
Transition of Care [facility]) - Progression Note    Patient Details  Name: MANSA STAMATIS MRN: [id number] Date of Birth: Jun 17, 1955  Transition of Care [facility]) CM/SW Contact  Ihor Gully, LCSW Phone Number: 02/10/2019, 2:54 PM  Clinical Narrative:    Patient admitted for hypokalemia, from home alone. At baseline, patient is independent, drives and has no needs. SNF recommendation was discussed. Patient deferred to daughter, Zamani. Patient does not have SNF benefits with his insurance. Private pay was discussed. Family is considering SNF private pay or discharge home to daughter's house with DME and HH. SNF list provided to daughter, via email. Discussed short term rehab.  Daughter states she has appointment with Medicaid to apply for Medicaid. Patient is likely over the income limit.  Danielle to review list and options with patient and return contact.   Expected Discharge Plan: [place] Barriers to Discharge: Continued Medical Work up  Expected Discharge Plan and Services Expected Discharge Plan: [place] arrangements for the past 2 months: Single Family Home                                       Social Determinants of Health (SDOH) Interventions    Readmission Risk Interventions No flowsheet data found.

## 2019-02-10 NOTE — Evaluation (Signed)
Physical Therapy Evaluation Patient Details Name: Troy Barrera MRN: [id number] DOB: [birth year] Today's Date: 02/10/2019   History of Present Illness  Troy Barrera is a 63 y.o. male with medical history significant of gastric cancer, type 2 diabetes, GERD who is coming to the emergency department via EMS after having progressively worse weakness for the past few weeks, but particularly has felt weak over the weekend.  He says that he has been having trouble to walk across the room due to weakness.  He denies fever, but complains of chills/feeling cold.  No headache, sore throat or rhinorrhea.  His appetite is decreased, but denies abdominal pain, nausea or emesis, diarrhea, constipation, melena or hematochezia.  He has lost another 15 pounds of weight in the past few months.  He complains of mild dyspnea, but denies wheezing, productive cough or hemoptysis.  No chest pain, palpitations, diaphoresis, dizziness, but EMS earlier today described him as hypotensive and near syncopal.  His BP after a 500 mL bolus given by EMS was only 90/56 mmHg.  He has mild lower extremity edema, but denies PND or orthopnea.  No dysuria, frequency or hematuria.  No polyuria, polydipsia, polyphagia or blurred vision.    Clinical Impression  Patient limited for functional mobility as stated below secondary to BLE weakness, fatigue and poor standing balance.  Patient limited to a few steps at bedside requiring hand held assist due to fall risk and tolerated sitting up in chair to eat breakfast - RN notified.  Patient will benefit from continued physical therapy in hospital and recommended venue below to increase strength, balance, endurance for safe ADLs and gait.     Follow Up Recommendations SNF;Supervision - Intermittent;Supervision for mobility/OOB    Equipment Recommendations  None recommended by PT;Rolling walker with 5" wheels    Recommendations for Other Services       Precautions / Restrictions  Precautions Precautions: Fall Restrictions Weight Bearing Restrictions: No      Mobility  Bed Mobility Overal bed mobility: Needs Assistance Bed Mobility: Supine to Sit     Supine to sit: Min assist     General bed mobility comments: slow labored movement  Transfers Overall transfer level: Needs assistance Equipment used: 1 person hand held assist Transfers: Sit to/from Stand;Stand Pivot Transfers Sit to Stand: Min assist Stand pivot transfers: Min assist       General transfer comment: very unsteady on feet, labored movement  Ambulation/Gait Ambulation/Gait assistance: Min assist Gait Distance (Feet): 5 Feet Assistive device: 1 person hand held assist Gait Pattern/deviations: Decreased step length - right;Decreased step length - left;Decreased stride length Gait velocity: slow   General Gait Details: limited to 5-6 short labored steps at bedside due to c/o fatigue  Stairs            Wheelchair Mobility    Modified Rankin (Stroke Patients Only)       Balance Overall balance assessment: Needs assistance Sitting-balance support: Feet supported;No upper extremity supported Sitting balance-Leahy Scale: Fair Sitting balance - Comments: fair/good seated at bedside   Standing balance support: No upper extremity supported;During functional activity Standing balance-Leahy Scale: Poor Standing balance comment: fair/poor requring hand held assist                             Pertinent Vitals/Pain Pain Assessment: No/denies pain    Home Living Family/patient expects to be discharged to:: Private residence Living Arrangements: Alone Available Help at Discharge: Family;Available PRN/intermittently  Type of Home: House Home Access: Stairs to enter Entrance Stairs-Rails: Right;Left(to wide to reach both) Entrance Stairs-Number of Steps: 12 Home Layout: One level Home Equipment: Cane - single point      Prior Function Level of Independence:  Independent with assistive device(s)         Comments: household and short distanced [facility]     Hand Dominance        Extremity/Trunk Assessment   Upper Extremity Assessment Upper Extremity Assessment: Generalized weakness    Lower Extremity Assessment Lower Extremity Assessment: Generalized weakness    Cervical / Trunk Assessment Cervical / Trunk Assessment: Normal  Communication   Communication: No difficulties  Cognition Arousal/Alertness: Awake/alert Behavior During Therapy: WFL for tasks assessed/performed Overall Cognitive Status: Within Functional Limits for tasks assessed                                        General Comments      Exercises     Assessment/Plan    PT Assessment Patient needs continued PT services  PT Problem List Decreased strength;Decreased activity tolerance;Decreased balance;Decreased mobility       PT Treatment Interventions Gait training;Stair training;Functional mobility training;Therapeutic activities;Therapeutic exercise;Patient/family education;Balance training    PT Goals (Current goals can be found in the Care Plan section)  Acute Rehab PT Goals Patient Stated Goal: return home with his daughter to assist PT Goal Formulation: With patient Time For Goal Achievement: 02/24/19 Potential to Achieve Goals: Good    Frequency Min 3X/week   Barriers to discharge        Co-evaluation               AM-PAC PT "6 Clicks" Mobility  Outcome Measure Help needed turning from your back to your side while in a flat bed without using bedrails?: A Little Help needed moving from lying on your back to sitting on the side of a flat bed without using bedrails?: A Little Help needed moving to and from a bed to a chair (including a wheelchair)?: A Little Help needed standing up from a chair using your arms (e.g., wheelchair or bedside chair)?: A Little Help needed to walk in hospital room?: A  Lot Help needed climbing 3-5 steps with a railing? : A Lot 6 Click Score: 16    End of Session   Activity Tolerance: Patient tolerated treatment well;Patient limited by fatigue Patient left: in chair;with call bell/phone within reach Nurse Communication: Mobility status PT Visit Diagnosis: Unsteadiness on feet (R26.81);Other abnormalities of gait and mobility (R26.89);Muscle weakness (generalized) (M62.81)    Time: [id number] PT Time Calculation (min) (ACUTE ONLY): 24 min   Charges:   PT Evaluation $PT Eval Moderate Complexity: 1 Mod PT Treatments $Therapeutic Activity: 23-37 mins        9:30 AM, 02/10/19 Lonell Grandchild, MPT Physical Therapist with [facility] 336 [phone] office [phone] mobile phone

## 2019-02-10 NOTE — Plan of Care (Signed)
  Problem: Acute Rehab PT Goals(only PT should resolve) Goal: Pt Will Go Supine/Side To Sit Outcome: Progressing Flowsheets (Taken 02/10/2019 0933) Pt will go Supine/Side to Sit: with supervision Goal: Patient Will Transfer Sit To/From Stand Outcome: Progressing Flowsheets (Taken 02/10/2019 0933) Patient will transfer sit to/from stand: with supervision Goal: Pt Will Transfer Bed To Chair/Chair To Bed Outcome: Progressing Flowsheets (Taken 02/10/2019 0933) Pt will Transfer Bed to Chair/Chair to Bed: with supervision Goal: Pt Will Ambulate Outcome: Progressing Flowsheets (Taken 02/10/2019 0933) Pt will Ambulate:  50 feet  with min guard assist  with cane  with rolling walker   9:35 AM, 02/10/19 Lonell Grandchild, MPT Physical Therapist with [facility] 336 [phone] office [phone] mobile phone

## 2019-02-10 NOTE — NC FL2 (Signed)
[place] LEVEL OF CARE SCREENING TOOL     IDENTIFICATION  Patient Name: Troy Barrera Birthdate: [birth year] Sex: male Admission Date (Current Location): 02/09/2019  [facility] and [place] Number:  [employer] and Address:  [place] [address] [place]      Provider Number: [id number]  Attending Physician Name and Address:  Barton Dubois, MD  Relative Name and Phone Number:  Obie - daughter [phone]    Current Level of Care: Hospital Recommended Level of Care: [place] Prior Approval Number:    Date Approved/Denied:   PASRR Number: [id number]  Discharge Plan: SNF    Current Diagnoses: Patient Active Problem List   Diagnosis Date Noted  . Protein-calorie malnutrition, severe 02/10/2019  . Hypokalemia 02/09/2019  . Prolonged QT interval 02/09/2019  . Hypophosphatemia 02/09/2019  . Hypernatremia 02/09/2019  . GERD (gastroesophageal reflux disease)   . Anemia   . Leukopenia   . Hypercarbia   . Encounter for chemotherapy management 03/23/2015  . Cancer of greater curvature of stomach ([place]) 02/11/2015  . Diabetes mellitus without complication ([place])   . Gastric cancer s/p subtotal gastrectomy 01/28/15 01/28/2015    Orientation RESPIRATION BLADDER Height & Weight     Self, Time, Situation, Place  Normal Continent Weight: 40.8 kg Height:  5\' 10"  (177.8 cm)  BEHAVIORAL SYMPTOMS/MOOD NEUROLOGICAL BOWEL NUTRITION STATUS      Continent Diet(Regular, ensure supplements, Magic cup)  AMBULATORY STATUS COMMUNICATION OF NEEDS Skin   Limited Assist Verbally Other (Comment)(heels cracking)                       Personal Care Assistance Level of Assistance  Bathing, Feeding, Dressing Bathing Assistance: Limited assistance Feeding assistance: Independent Dressing Assistance: Limited assistance     Functional Limitations Info  Sight, Hearing, Speech Sight Info: Adequate Hearing Info:  Adequate Speech Info: Adequate    SPECIAL CARE FACTORS FREQUENCY  PT (By licensed PT)     PT Frequency: 5 times a week              Contractures Contractures Info: Not present    Additional Factors Info  Code Status, Allergies Code Status Info: FULL Allergies Info: NKDA           Current Medications (02/10/2019):  This is the current hospital active medication list Current Facility-Administered Medications  Medication Dose Route Frequency Provider Last Rate Last Dose  . 0.45 % NaCl with KCl 20 mEq / L infusion   Intravenous Continuous Barton Dubois, MD 75 mL/hr at 02/10/19 1300    . acetaminophen (TYLENOL) tablet 650 mg  650 mg Oral Q6H PRN Reubin Milan, MD       Or  . acetaminophen (TYLENOL) suppository 650 mg  650 mg Rectal Q6H PRN Reubin Milan, MD      . Chlorhexidine Gluconate Cloth 2 % PADS 6 each  6 each Topical Daily Reubin Milan, MD      . Chlorhexidine Gluconate Cloth 2 % PADS 6 each  6 each Topical Daily Reubin Milan, MD   6 each at 02/10/19 [phone]  . feeding supplement (ENSURE ENLIVE) (ENSURE ENLIVE) liquid 237 mL  237 mL Oral TID BM Barton Dubois, MD   237 mL at 02/10/19 0955  . feeding supplement (PRO-STAT SUGAR FREE 64) liquid 30 mL  30 mL Oral BID Barton Dubois, MD      . Derrill Memo ON 02/11/2019] influenza vac split  quadrivalent PF (FLUARIX) injection 0.5 mL  0.5 mL Intramuscular Tomorrow-1000 Reubin Milan, MD      . phosphorus (K PHOS NEUTRAL) tablet 500 mg  500 mg Oral TID Reubin Milan, MD   500 mg at 02/10/19 0956  . potassium chloride SA (KLOR-CON) CR tablet 40 mEq  40 mEq Oral BID Barton Dubois, MD   40 mEq at 02/10/19 [id number]     Discharge Medications: Please see discharge summary for a list of discharge medications.  Relevant Imaging Results:  Relevant Lab Results:   Additional Information SS# [id number]  Boneta Lucks, RN

## 2019-02-10 NOTE — Progress Notes (Signed)
PROGRESS NOTE    Troy Barrera  [id number] DOB: 05-30-1955 DOA: 02/09/2019 PCP: [name], MD    Brief Narrative:  63 y.o. male with medical history significant of gastric cancer, type 2 diabetes, GERD who is coming to the emergency department via EMS after having progressively worse weakness for the past few weeks, but particularly has felt weak over the weekend.  He says that he has been having trouble to walk across the room due to weakness.  He denies fever, but complains of chills/feeling cold.  No headache, sore throat or rhinorrhea.  His appetite is decreased, but denies abdominal pain, nausea or emesis, diarrhea, constipation, melena or hematochezia.  He has lost another 15 pounds of weight in the past few months.  He complains of mild dyspnea, but denies wheezing, productive cough or hemoptysis.  No chest pain, palpitations, diaphoresis, dizziness, but EMS earlier today described him as hypotensive and near syncopal.  His BP after a 500 mL bolus given by EMS was only 90/56 mmHg.  He has mild lower extremity edema, but denies PND or orthopnea.  No dysuria, frequency or hematuria.  No polyuria, polydipsia, polyphagia or blurred vision.  ED Course: Initial vital signs temperature 97.9 F, pulse 60, respirations 22, blood pressure 105 to 78 mmHg and O2 sat 93% on room air.  The patient received electrolyte replacement in the emergency department. His white count is 3.5, hemoglobin 9.7 g/dL and platelets 196.  Sodium was 146, potassium 2.1, chloride 98 and CO2 39 mmol/L.  Glucose 109, BUN 16, creatinine 0.78, magnesium 2.0, phosphorus 1.5 and calcium 6.9 mg/dL.  Total protein was 4.7 and albumin 1.8 g/dL.  The rest of the hepatic function tests are within normal limits.   Assessment & Plan: 1-severe hypokalemia -Continue potassium replacement and adjust supplementation -Continue telemetry monitoring -For now continue IV fluids and encourage oral intake.  2-prolonged QT interval -In  the setting of electrolytes abnormalities -Patient denies chest pain -Continue monitoring on telemetry -Repeat EKG in a.m.  3-Diabetes mellitus without complication (HCC) -Continue modified carbohydrate diet -A1c 5.2 currently -No need for hypoglycemic medications at this time.  4-GERD (gastroesophageal reflux disease) -Continue PPI.  5-Anemia -Appears to be secondary to anemia of chronic disease -Ferritin 443 -B12 297 -Will add multivitamins on daily basis.  6-Hypophosphatemia -Continue repletion and follow electrolytes trend. -Slightly associated with poor oral intake and overall malnutrition.  7-Hypernatremia -Resolved after fluid resuscitation -Continue to follow electrolytes trend.  8-Protein-calorie malnutrition, severe -Continue Ensure, adjusted to 3 times a day in between meals to provide higher calorie intake. -Continue encouraging proper oral nutrition -Started on Prostat and MV -Follow nutritional service for further recommendations.  9-physical deconditioning weakness -Physical therapy recommending skilled nursing facility -Social worker has been made aware will follow recommendation and assistance with placement. -Patient currently easily fatigued, with very poor balance and significantly weak.  High risk for falling and hurting himself, most living alone prior to admission.   DVT prophylaxis: SCD's Code Status: Full code Family Communication: Daughter over the phone. Disposition Plan: Remains inpatient, continue electrolytes repletion, continue monitoring on telemetry, following physical therapy recommendations will attempt nursing home placement for rehabilitation.  Continue Ensure, but change to 3 times a day and also start the use of pro-stat.  Follow magnesium and phosphorus level.  Consultants:   Nutritional service.  Procedures:   See below for x-ray reports.  Antimicrobials:  Anti-infectives (From admission, onward)   None        Subjective: Afebrile,  no chest pain, no nausea, no vomiting, no shortness of breath.  Patient reports feeling weak, easily fatigued and with poor balance.  Objective: Vitals:   02/10/19 1109 02/10/19 1200 02/10/19 1300 02/10/19 1611  BP:  97/74 90/73   Pulse:    69  Resp: 15 15 18 18   Temp: 98.4 F (36.9 C)   99.1 F (37.3 C)  TempSrc: Oral   Oral  SpO2:    100%  Weight:      Height:        Intake/Output Summary (Last 24 hours) at 02/10/2019 1652 Last data filed at 02/10/2019 1300 Gross per 24 hour  Intake 1262.29 ml  Output 200 ml  Net 1062.29 ml   Filed Weights   02/09/19 1643 02/09/19 2200  Weight: 49.9 kg 40.8 kg    Examination:  General exam: Alert, awake, oriented x 3, chronically ill and cachectic in appearance; expressing to be significantly weak, easily fatigue and with poor balance. Respiratory system: Clear to auscultation. Respiratory effort normal. Cardiovascular system:RRR. No murmurs, rubs, gallops. Gastrointestinal system: Abdomen is nondistended, soft and nontender. No organomegaly or masses felt. Normal bowel sounds heard. Central nervous system: Alert and oriented. No focal neurological deficits. Extremities: No C/C/E, +pedal pulses Skin: No rashes, lesions or ulcers Psychiatry: Judgement and insight appear normal. Mood & affect appropriate.     Data Reviewed: I have personally reviewed following labs and imaging studies  CBC: Recent Labs  Lab 02/09/19 1845 02/10/19 0422  WBC 3.5* 9.1  NEUTROABS 2.8  --   HGB 9.7* 8.8*  HCT 29.9* 26.6*  MCV 87.2 86.4  PLT 196 [id number]   Basic Metabolic Panel: Recent Labs  Lab 02/09/19 1845 02/10/19 0422 02/10/19 1355  NA 146* 143 142  K 2.1* 2.9* 3.3*  CL 98 99 97*  CO2 39* 36* 36*  GLUCOSE 109* 76 161*  BUN 16 16 17   CREATININE 0.78 0.77 0.77  CALCIUM 6.9* 6.9* 6.9*  MG 2.0  --   --   PHOS 1.5*  --   --    GFR: Estimated Creatinine Clearance: 55.3 mL/min (by C-G formula based on SCr of 0.77  mg/dL).   Liver Function Tests: Recent Labs  Lab 02/09/19 1845 02/10/19 0422  AST 29 27  ALT 18 17  ALKPHOS 110 101  BILITOT 0.4 0.6  PROT 4.7* 4.5*  ALBUMIN 1.8* 1.6*   HbA1C: Recent Labs    02/09/19 1845  HGBA1C 5.2   CBG: Recent Labs  Lab 02/10/19 0749 02/10/19 1111 02/10/19 1610  GLUCAP 114* 99 185*   Anemia Panel: Recent Labs    02/09/19 1845 02/10/19 0422  VITAMINB12 297  --   FOLATE  --  8.6  FERRITIN 443*  --   TIBC 109*  --   IRON 35*  --   RETICCTPCT  --  0.5    Recent Results (from the past 240 hour(s))  SARS Coronavirus 2 [facility] order, Performed in [facility] hospital lab) Nasopharyngeal Nasopharyngeal Swab     Status: None   Collection Time: 02/09/19  8:10 PM   Specimen: Nasopharyngeal Swab  Result Value Ref Range Status   SARS Coronavirus 2 NEGATIVE NEGATIVE Final    Comment: (NOTE) If result is NEGATIVE SARS-CoV-2 target nucleic acids are NOT DETECTED. The SARS-CoV-2 RNA is generally detectable in upper and lower  respiratory specimens during the acute phase of infection. The lowest  concentration of SARS-CoV-2 viral copies this assay can detect is 250  copies / mL. A  negative result does not preclude SARS-CoV-2 infection  and should not be used as the sole basis for treatment or other  patient management decisions.  A negative result may occur with  improper specimen collection / handling, submission of specimen other  than nasopharyngeal swab, presence of viral mutation(s) within the  areas targeted by this assay, and inadequate number of viral copies  (<250 copies / mL). A negative result must be combined with clinical  observations, patient history, and epidemiological information. If result is POSITIVE SARS-CoV-2 target nucleic acids are DETECTED. The SARS-CoV-2 RNA is generally detectable in upper and lower  respiratory specimens dur ing the acute phase of infection.  Positive  results are indicative of active infection with  SARS-CoV-2.  Clinical  correlation with patient history and other diagnostic information is  necessary to determine patient infection status.  Positive results do  not rule out bacterial infection or co-infection with other viruses. If result is PRESUMPTIVE POSTIVE SARS-CoV-2 nucleic acids MAY BE PRESENT.   A presumptive positive result was obtained on the submitted specimen  and confirmed on repeat testing.  While 2019 novel coronavirus  (SARS-CoV-2) nucleic acids may be present in the submitted sample  additional confirmatory testing may be necessary for epidemiological  and / or clinical management purposes  to differentiate between  SARS-CoV-2 and other Sarbecovirus currently known to infect humans.  If clinically indicated additional testing with an alternate test  methodology [phone]) is advised. The SARS-CoV-2 RNA is generally  detectable in upper and lower respiratory sp ecimens during the acute  phase of infection. The expected result is Negative. Fact Sheet for Patients:  StrictlyIdeas.no Fact Sheet for Healthcare Providers: BankingDealers.co.za This test is not yet approved or cleared by the [place] FDA and has been authorized for detection and/or diagnosis of SARS-CoV-2 by FDA under an Emergency Use Authorization (EUA).  This EUA will remain in effect (meaning this test can be used) for the duration of the COVID-19 declaration under Section 564(b)(1) of the Act, 21 U.S.C. section 360bbb-3(b)(1), unless the authorization is terminated or revoked sooner. Performed at [facility], [address], [place], [place] [address]   MRSA PCR Screening     Status: None   Collection Time: 02/09/19 10:09 PM   Specimen: Nasal Mucosa; Nasopharyngeal  Result Value Ref Range Status   MRSA by PCR NEGATIVE NEGATIVE Final    Comment:        The GeneXpert MRSA Assay (FDA approved for NASAL specimens only), is one component of a  comprehensive MRSA colonization surveillance program. It is not intended to diagnose MRSA infection nor to guide or monitor treatment for MRSA infections. Performed at [facility], [address], [place], [place] [address]      Radiology Studies: Dg Chest Portable 1 View  Result Date: 02/09/2019 CLINICAL DATA:  Witnessed syncopal episode today. EXAM: PORTABLE CHEST 1 VIEW COMPARISON:  None. FINDINGS: Heart size and mediastinal contours are within normal limits. Vague opacity at the LEFT lung base, suspected small layering pleural effusion. Lungs otherwise clear. No pneumothorax seen. Osseous structures about the chest are unremarkable. IMPRESSION: 1. Vague opacity at the LEFT lung base, suspected small layering pleural effusion. 2. Lungs otherwise clear. No evidence of pneumonia or pulmonary edema. Electronically Signed   By: Franki Cabot M.D.   On: 02/09/2019 19:00    Scheduled Meds: . Chlorhexidine Gluconate Cloth  6 each Topical Daily  . Chlorhexidine Gluconate Cloth  6 each Topical Daily  . feeding supplement (ENSURE  ENLIVE)  237 mL Oral TID BM  . feeding supplement (PRO-STAT SUGAR FREE 64)  30 mL Oral BID  . [START ON 02/11/2019] influenza vac split quadrivalent PF  0.5 mL Intramuscular Tomorrow-1000  . potassium chloride SA  40 mEq Oral BID   Continuous Infusions: . 0.45 % NaCl with KCl 20 mEq / L 75 mL/hr at 02/10/19 1518     LOS: 0 days    Time spent: 35 minutes. Greater than 50% of this time was spent in direct contact with the patient, coordinating care and discussing relevant ongoing clinical issues, including severe protein calorie malnutrition, low BMI/cachexia, severe hypokalemia and increased risk for falling and hurting himself given current weakness and physical deconditioning.  Daughter updated over the phone and case discussed in multidisciplinary fashion involving Case manager and Education officer, museum.    Barton Dubois, MD [facility] Pager [phone]    02/10/2019, 4:52 PM

## 2019-02-10 NOTE — Progress Notes (Signed)
Initial Nutrition Assessment  DOCUMENTATION CODES:      INTERVENTION:  Ensure Enlive po BID, each supplement provides 350 kcal and 20 grams of protein   Magic cup with dinner daily:  provides 290 kcal and 9 grams of protein    NUTRITION DIAGNOSIS:   Severe Malnutrition related to chronic illness(gastric cancer -s/p partial gastrectomy 2016-) as evidenced by severe fat depletion, severe muscle depletion(chronic; ongoing wt loss-resulting in a BMI-12.9).   GOAL:  Patient will meet greater than or equal to 90% of their needs  MONITOR:   PO intake, Supplement acceptance, Weight trends, Labs, Skin  REASON FOR ASSESSMENT:   Malnutrition Screening Tool    ASSESSMENT: Patient is a underweight 63 yo male with history of gastric cancer, GERD and DM. Underwent partial gastrectomy (01/2015). Lives at home alone.   Home diet is regular. His daughter Vazzana lives in [place] and takes food to him bi-weekly. Talked with her via telephone. Patient not very forth coming with details of intake but daughter says he frequently  eats-soups, mashed potatoes, green beans. Little to no protein foods. Snacks: Cheese nips, ice cream. He does like Ensure -Strawberry. She is concerned that he is not eating well.   Chronic ongoing unplanned wt loss:  45.2 kg->>40.8 kg) 10% the past 6 months. April 2019 (51.2 kg ->>45.2) to April 2020 a loss additional 12%.  November 2017-2018-(61.6 kg->>52.8 kg) he experienced 17% weight loss.     Based on physical exam and chronic wt loss and diet history. Patient is severely malnourished. Severe muscle and fat loss.  Medications reviewed and include: Ensure Enlive, Prostat BID, Phosphorus, Potassium chloride  IVF: 0.45-NS@ 75 ml/hr  NUTRITION - FOCUSED PHYSICAL EXAM:    Most Recent Value  Orbital Region  Severe depletion  Upper Arm Region  Severe depletion  Buccal Region  Severe depletion  Temple Region  Severe depletion  Clavicle Bone Region  Severe  depletion  Clavicle and Acromion Bone Region  Severe depletion  Scapular Bone Region  Severe depletion  Patellar Region  Severe depletion  Edema (RD Assessment)  Moderate  Hair  Reviewed  Eyes  Reviewed  Mouth  Reviewed  Skin  Reviewed      Diet Order:   Diet Order            Diet heart healthy/carb modified Room service appropriate? Yes; Fluid consistency: Thin  Diet effective now              EDUCATION NEEDS: discussed with daughter    Skin:  Skin Assessment: Reviewed RN Assessment(dry cracked -heels)  Last BM:  10/5  Height:   Ht Readings from Last 1 Encounters:  02/09/19 5\' 10"  (1.778 m)    Weight:   Wt Readings from Last 1 Encounters:  02/09/19 40.8 kg    Ideal Body Weight:  75 kg  BMI:  Body mass index is 12.91 kg/m.  Estimated Nutritional Needs:   Kcal:  1600-1700 (~ 40 kcal/kg/bw)  Protein:  78-82 (1.9-2.0 gr/kg/bw)  Fluid:  >1200 ml daily   Colman Cater MS,RD,CSG,LDN Office: [phone] Pager: [phone]

## 2019-02-11 ENCOUNTER — Inpatient Hospital Stay (HOSPITAL_COMMUNITY): Payer: PRIVATE HEALTH INSURANCE

## 2019-02-11 DIAGNOSIS — R627 Adult failure to thrive: Secondary | ICD-10-CM

## 2019-02-11 DIAGNOSIS — D708 Other neutropenia: Secondary | ICD-10-CM

## 2019-02-11 LAB — URINALYSIS, COMPLETE (UACMP) WITH MICROSCOPIC
Bacteria, UA: NONE SEEN
Bilirubin Urine: NEGATIVE
Glucose, UA: NEGATIVE mg/dL
Hgb urine dipstick: NEGATIVE
Ketones, ur: NEGATIVE mg/dL
Leukocytes,Ua: NEGATIVE
Nitrite: NEGATIVE
Protein, ur: 30 mg/dL — AB
Specific Gravity, Urine: 1.032 — ABNORMAL HIGH (ref 1.005–1.030)
pH: 7 (ref 5.0–8.0)

## 2019-02-11 LAB — MAGNESIUM: Magnesium: 1.9 mg/dL (ref 1.7–2.4)

## 2019-02-11 LAB — BASIC METABOLIC PANEL
Anion gap: 9 (ref 5–15)
BUN: 17 mg/dL (ref 8–23)
CO2: 35 mmol/L — ABNORMAL HIGH (ref 22–32)
Calcium: 6.9 mg/dL — ABNORMAL LOW (ref 8.9–10.3)
Chloride: 97 mmol/L — ABNORMAL LOW (ref 98–111)
Creatinine, Ser: 0.65 mg/dL (ref 0.61–1.24)
GFR calc Af Amer: >60 mL/min (ref >60)
GFR calc non Af Amer: >60 mL/min (ref >60)
Glucose, Bld: 105 mg/dL — ABNORMAL HIGH (ref 70–99)
Potassium: 3.4 mmol/L — ABNORMAL LOW (ref 3.5–5.1)
Sodium: 141 mmol/L (ref 135–145)

## 2019-02-11 LAB — T4, FREE: Free T4: 1.07 ng/dL (ref 0.61–1.12)

## 2019-02-11 LAB — GLUCOSE, CAPILLARY
Glucose-Capillary: 96 mg/dL (ref 70–99)
Glucose-Capillary: 171 mg/dL — ABNORMAL HIGH (ref 70–99)
Glucose-Capillary: 191 mg/dL — ABNORMAL HIGH (ref 70–99)
Glucose-Capillary: 156 mg/dL — ABNORMAL HIGH (ref 70–99)

## 2019-02-11 LAB — TSH: TSH: 4.483 u[IU]/mL (ref 0.350–4.500)

## 2019-02-11 LAB — PHOSPHORUS: Phosphorus: 2.3 mg/dL — ABNORMAL LOW (ref 2.5–4.6)

## 2019-02-11 MED ORDER — POTASSIUM PHOSPHATE MONOBASIC 500 MG PO TABS
500.0000 mg | ORAL_TABLET | Freq: Two times a day (BID) | ORAL | Status: DC
  Administered 2019-02-11: 21:00:00 500 mg via ORAL
  Filled 2019-02-11 (×4): qty 1

## 2019-02-11 MED ORDER — POTASSIUM CHLORIDE IN NACL 20-0.9 MEQ/L-% IV SOLN
INTRAVENOUS | Status: DC
  Administered 2019-02-11 – 2019-02-12 (×2): via INTRAVENOUS

## 2019-02-11 MED ORDER — IOHEXOL 300 MG/ML  SOLN
100.0000 mL | Freq: Once | INTRAMUSCULAR | Status: AC | PRN
  Administered 2019-02-11: 75 mL via INTRAVENOUS

## 2019-02-11 MED ORDER — IOHEXOL 300 MG/ML  SOLN
30.0000 mL | Freq: Once | INTRAMUSCULAR | Status: AC | PRN
  Administered 2019-02-11: 30 mL via ORAL

## 2019-02-11 NOTE — NC FL2 (Signed)
[place] LEVEL OF CARE SCREENING TOOL     IDENTIFICATION  Patient Name: JULIANNA Barrera Birthdate: [birth year] Sex: male Admission Date (Current Location): 02/09/2019  [facility] and [place] Number:  [employer] and Address:  [place] [address] [place]      Provider Number: [id number]  Attending Physician Name and Address:  Orson Eva, MD  Relative Name and Phone Number:  [place] - daughter [phone]    Current Level of Care: Hospital Recommended Level of Care: [place] Prior Approval Number:    Date Approved/Denied:   PASRR Number: [id number]  Discharge Plan: SNF    Current Diagnoses: Patient Active Problem List   Diagnosis Date Noted  . Failure to thrive in adult 02/11/2019  . Protein-calorie malnutrition, severe 02/10/2019  . Hypokalemia 02/09/2019  . Prolonged QT interval 02/09/2019  . Hypophosphatemia 02/09/2019  . Hypernatremia 02/09/2019  . GERD (gastroesophageal reflux disease)   . Anemia   . Leukopenia   . Hypercarbia   . Encounter for chemotherapy management 03/23/2015  . Cancer of greater curvature of stomach ([place]) 02/11/2015  . Diabetes mellitus without complication ([place])   . Gastric cancer s/p subtotal gastrectomy 01/28/15 01/28/2015    Orientation RESPIRATION BLADDER Height & Weight     Self, Time, Situation, Place  Normal Continent Weight: 89 lb 15.2 oz (40.8 kg) Height:  5\' 10"  (177.8 cm)  BEHAVIORAL SYMPTOMS/MOOD NEUROLOGICAL BOWEL NUTRITION STATUS      Continent Diet(Regular, ensure supplements, Magic cup)  AMBULATORY STATUS COMMUNICATION OF NEEDS Skin   Limited Assist Verbally Other (Comment)(heels cracking)                       Personal Care Assistance Level of Assistance  Bathing, Feeding, Dressing Bathing Assistance: Limited assistance Feeding assistance: Independent Dressing Assistance: Limited assistance     Functional Limitations Info  Sight,  Hearing, Speech Sight Info: Adequate Hearing Info: Adequate Speech Info: Adequate    SPECIAL CARE FACTORS FREQUENCY  PT (By licensed PT)     PT Frequency: 5 times a week              Contractures Contractures Info: Not present    Additional Factors Info  Code Status, Allergies Code Status Info: FULL Allergies Info: NKDA           Current Medications (02/11/2019):  This is the current hospital active medication list Current Facility-Administered Medications  Medication Dose Route Frequency Provider Last Rate Last Dose  . 0.9 % NaCl with KCl 20 mEq/ L  infusion   Intravenous Continuous Tat, Shanon Brow, MD 75 mL/hr at 02/11/19 1240    . acetaminophen (TYLENOL) tablet 650 mg  650 mg Oral Q6H PRN Barton Dubois, MD       Or  . acetaminophen (TYLENOL) suppository 650 mg  650 mg Rectal Q6H PRN Barton Dubois, MD      . Chlorhexidine Gluconate Cloth 2 % PADS 6 each  6 each Topical Daily Barton Dubois, MD   6 each at 02/11/19 [phone]  . Chlorhexidine Gluconate Cloth 2 % PADS 6 each  6 each Topical Daily Barton Dubois, MD   6 each at 02/11/19 [phone]  . feeding supplement (ENSURE ENLIVE) (ENSURE ENLIVE) liquid 237 mL  237 mL Oral TID BM Barton Dubois, MD   237 mL at 02/11/19 1240  . feeding supplement (PRO-STAT SUGAR FREE 64) liquid 30 mL  30 mL Oral BID Barton Dubois, MD  30 mL at 02/11/19 1000  . influenza vac split quadrivalent PF (FLUARIX) injection 0.5 mL  0.5 mL Intramuscular Tomorrow-1000 Reubin Milan, MD      . iohexol (OMNIPAQUE) 300 MG/ML solution 30 mL  30 mL Oral Once PRN Tat, David, MD      . potassium chloride SA (KLOR-CON) CR tablet 40 mEq  40 mEq Oral BID Barton Dubois, MD   40 mEq at 02/11/19 1000     Discharge Medications: Please see discharge summary for a list of discharge medications.  Relevant Imaging Results:  Relevant Lab Results:   Additional Information SS# [id number]  Ihor Gully, LCSW

## 2019-02-11 NOTE — TOC Progression Note (Signed)
Transition of Care [facility]) - Progression Note    Patient Details  Name: MONTREZ AWALT MRN: [id number] Date of Birth: Jun 03, 1955  Transition of Care [facility]) CM/SW Contact  Ihor Gully, LCSW Phone Number: 02/11/2019, 3:45 PM  Clinical Narrative:    Daughter, [place], has assessed patient's finances. Patient can private pay for placement. Daughter interested in SNF and ALF. Discussed ALF being a less expensive option. Discussed that daughter and patient are also interested in patient getting stronger in SNF or ALF and going to an independent living retirement community.  Daughter is also interested in palliative care following patient in the community and selected [facility]. Daughter advised that discharge is scheduled for tomorrow if patient continues to be medically stable.  Palliative consult placed by attending. Referrals sent to multiple ALF's in [facility] and [facility].  Referral made to Einar Gip with [facility].    Expected Discharge Plan: [place] Barriers to Discharge: Continued Medical Work up  Expected Discharge Plan and Services Expected Discharge Plan: [place] arrangements for the past 2 months: Single Family Home                                       Social Determinants of Health (SDOH) Interventions    Readmission Risk Interventions No flowsheet data found.

## 2019-02-11 NOTE — Progress Notes (Addendum)
PROGRESS NOTE  Troy Barrera [id number] DOB: [birth year] DOA: 02/09/2019 PCP: [name], MD  Brief History:  63 year old male with a history of gastric adenocarcinoma diagnosed sept 2016 s/p gastrojejunostomy and diabetes mellitus type 2 presenting with 2 to 3-week history of progressive weakness worsening in the 2 days prior to admission.  The patient was beginning to feel dizzy when he stood up and this resulted in a fall backwards onto his couch.  He did not hit his head.  He did not lose consciousness.  In the past 2 to 3 weeks, the patient has had decreased oral intake.  He states that he has lost an additional 15 pounds.  He lives by himself and does not have any one to assist him in his ADLs.  Because of his progressive weakness and gait instability and failure to thrive, the patient was admitted for further evaluation.  Assessment/Plan: Failure to thrive/weight loss -Folic acid 8.6 -[id number] -CT chest/abd/pelvis to r/o recurrence -TSH -mag--1.9 -PT eval-->SNF -continue ensure -check UA  Dehydration/hypernatremia -initially started on hypotonic fluid with improvement -Continue IV fluids as the patient's oral intake remains extremely poor -am BMP  Hypokalemia -Replete  Hypophosphatemia -Repleted -Recheck phosphorus  Diabetes mellitus type 2 -02/09/2019 hemoglobin A1c--5.2 -Allow for liberal glycemic control at this point  Gastric adenocarcinoma stage IIIA -04/09/17 CT chest/abdomen/pelvis--no recurrence -follows Dr. Benay Spice -Status post partial gastrectomy  Severe Malnutrition -continue ensure  Goals of care -consult palliative medicine per daughter request     Disposition Plan:   SNF in 1-2 days  Family Communication:   Daughter updated at bedside 10/7  Consultants:none    Code Status:  FULL   DVT Prophylaxis:   [place] Lovenox   Procedures: As Listed in Progress Note Above  Antibiotics: None       Subjective: Patient is  feeling a little better but remains quite weak.  He denies any fevers, chills, headache, chest pain, shortness breath, coughing, hemoptysis, nausea, vomiting, diarrhea, dysuria.  He states that he has no appetite.  Objective: Vitals:   02/11/19 0500 02/11/19 0600 02/11/19 0700 02/11/19 0800  BP: 100/62 98/72 99/72  114/75  Pulse: 63 72 72 89  Resp: 17 15 19 16   Temp: 99.9 F (37.7 C)     TempSrc: Oral     SpO2: 97% 98% 100% 99%  Weight:      Height:        Intake/Output Summary (Last 24 hours) at 02/11/2019 1203 Last data filed at 02/11/2019 [id number] Gross per 24 hour  Intake 1561.7 ml  Output -  Net 1561.7 ml   Weight change:  Exam:   General:  Pt is alert, follows commands appropriately, not in acute distress  HEENT: No icterus, No thrush, No neck mass, [place]/AT  Cardiovascular: RRR, S1/S2, no rubs, no gallops  Respiratory:diminished breath sounds.  No wheeze  Abdomen: Soft/+BS, non tender, non distended, no guarding  Extremities: No edema, No lymphangitis, No petechiae, No rashes, no synovitis   Data Reviewed: I have personally reviewed following labs and imaging studies Basic Metabolic Panel: Recent Labs  Lab 02/09/19 1845 02/10/19 0422 02/10/19 1355 02/11/19 0427  NA 146* 143 142 141  K 2.1* 2.9* 3.3* 3.4*  CL 98 99 97* 97*  CO2 39* 36* 36* 35*  GLUCOSE 109* 76 161* 105*  BUN 16 16 17 17   CREATININE 0.78 0.77 0.77 0.65  CALCIUM 6.9* 6.9* 6.9* 6.9*  MG 2.0  --   --  1.9  PHOS 1.5*  --  2.6  --    Liver Function Tests: Recent Labs  Lab 02/09/19 1845 02/10/19 0422  AST 29 27  ALT 18 17  ALKPHOS 110 101  BILITOT 0.4 0.6  PROT 4.7* 4.5*  ALBUMIN 1.8* 1.6*   No results for input(s): LIPASE, AMYLASE in the last 168 hours. No results for input(s): AMMONIA in the last 168 hours. Coagulation Profile: No results for input(s): INR, PROTIME in the last 168 hours. CBC: Recent Labs  Lab 02/09/19 1845 02/10/19 0422  WBC 3.5* 9.1  NEUTROABS 2.8  --   HGB  9.7* 8.8*  HCT 29.9* 26.6*  MCV 87.2 86.4  PLT 196 169   Cardiac Enzymes: No results for input(s): CKTOTAL, CKMB, CKMBINDEX, TROPONINI in the last 168 hours. BNP: Invalid input(s): POCBNP CBG: Recent Labs  Lab 02/10/19 1111 02/10/19 1610 02/10/19 2121 02/11/19 0717 02/11/19 1125  GLUCAP 99 185* 130* 96 171*   HbA1C: Recent Labs    02/09/19 1845  HGBA1C 5.2   Urine analysis: No results found for: COLORURINE, APPEARANCEUR, LABSPEC, PHURINE, GLUCOSEU, HGBUR, BILIRUBINUR, KETONESUR, PROTEINUR, UROBILINOGEN, NITRITE, LEUKOCYTESUR Sepsis Labs: @LABRCNTIP (procalcitonin:4,lacticidven:4) ) Recent Results (from the past 240 hour(s))  SARS Coronavirus 2 [facility] order, Performed in [facility] hospital lab) Nasopharyngeal Nasopharyngeal Swab     Status: None   Collection Time: 02/09/19  8:10 PM   Specimen: Nasopharyngeal Swab  Result Value Ref Range Status   SARS Coronavirus 2 NEGATIVE NEGATIVE Final    Comment: (NOTE) If result is NEGATIVE SARS-CoV-2 target nucleic acids are NOT DETECTED. The SARS-CoV-2 RNA is generally detectable in upper and lower  respiratory specimens during the acute phase of infection. The lowest  concentration of SARS-CoV-2 viral copies this assay can detect is 250  copies / mL. A negative result does not preclude SARS-CoV-2 infection  and should not be used as the sole basis for treatment or other  patient management decisions.  A negative result may occur with  improper specimen collection / handling, submission of specimen other  than nasopharyngeal swab, presence of viral mutation(s) within the  areas targeted by this assay, and inadequate number of viral copies  (<250 copies / mL). A negative result must be combined with clinical  observations, patient history, and epidemiological information. If result is POSITIVE SARS-CoV-2 target nucleic acids are DETECTED. The SARS-CoV-2 RNA is generally detectable in upper and lower  respiratory specimens  dur ing the acute phase of infection.  Positive  results are indicative of active infection with SARS-CoV-2.  Clinical  correlation with patient history and other diagnostic information is  necessary to determine patient infection status.  Positive results do  not rule out bacterial infection or co-infection with other viruses. If result is PRESUMPTIVE POSTIVE SARS-CoV-2 nucleic acids MAY BE PRESENT.   A presumptive positive result was obtained on the submitted specimen  and confirmed on repeat testing.  While 2019 novel coronavirus  (SARS-CoV-2) nucleic acids may be present in the submitted sample  additional confirmatory testing may be necessary for epidemiological  and / or clinical management purposes  to differentiate between  SARS-CoV-2 and other Sarbecovirus currently known to infect humans.  If clinically indicated additional testing with an alternate test  methodology [phone]) is advised. The SARS-CoV-2 RNA is generally  detectable in upper and lower respiratory sp ecimens during the acute  phase of infection. The expected result is Negative. Fact Sheet for Patients:  StrictlyIdeas.no Fact Sheet for Healthcare Providers: BankingDealers.co.za This test is  not yet approved or cleared by the [place] and has been authorized for detection and/or diagnosis of SARS-CoV-2 by FDA under an Emergency Use Authorization (EUA).  This EUA will remain in effect (meaning this test can be used) for the duration of the COVID-19 declaration under Section 564(b)(1) of the Act, 21 U.S.C. section 360bbb-3(b)(1), unless the authorization is terminated or revoked sooner. Performed at [facility], [address], [place], [place] [address]   MRSA PCR Screening     Status: None   Collection Time: 02/09/19 10:09 PM   Specimen: Nasal Mucosa; Nasopharyngeal  Result Value Ref Range Status   MRSA by PCR NEGATIVE NEGATIVE Final    Comment:         The GeneXpert MRSA Assay (FDA approved for NASAL specimens only), is one component of a comprehensive MRSA colonization surveillance program. It is not intended to diagnose MRSA infection nor to guide or monitor treatment for MRSA infections. Performed at [facility], [address] [place], [place] [address]      Scheduled Meds: . Chlorhexidine Gluconate Cloth  6 each Topical Daily  . Chlorhexidine Gluconate Cloth  6 each Topical Daily  . feeding supplement (ENSURE ENLIVE)  237 mL Oral TID BM  . feeding supplement (PRO-STAT SUGAR FREE 64)  30 mL Oral BID  . influenza vac split quadrivalent PF  0.5 mL Intramuscular Tomorrow-1000  . potassium chloride SA  40 mEq Oral BID   Continuous Infusions:  Procedures/Studies: Dg Chest Portable 1 View  Result Date: 02/09/2019 CLINICAL DATA:  Witnessed syncopal episode today. EXAM: PORTABLE CHEST 1 VIEW COMPARISON:  None. FINDINGS: Heart size and mediastinal contours are within normal limits. Vague opacity at the LEFT lung base, suspected small layering pleural effusion. Lungs otherwise clear. No pneumothorax seen. Osseous structures about the chest are unremarkable. IMPRESSION: 1. Vague opacity at the LEFT lung base, suspected small layering pleural effusion. 2. Lungs otherwise clear. No evidence of pneumonia or pulmonary edema. Electronically Signed   By: Franki Cabot M.D.   On: 02/09/2019 19:00    Orson Eva, DO  [facility] Pager [phone]  If 7PM-7AM, please contact night-coverage [email or web address] Password [email or web address] 02/11/2019, 12:03 PM   LOS: 1 day

## 2019-02-11 NOTE — Progress Notes (Signed)
PMT consult received and chart reviewed. Discussed with Dr. Carles Collet. Daughter, Franta has requested palliative care consultation. No family at bedside. Patient awake, alert, and working on drinking contrast for CT scan this afternoon.   Call placed to daughter, Richardson. Plan to meet with patient and daughter tomorrow, 10/8 around 10am. Thank you.  NO CHARGE  Ihor Dow, [place], FNP-C Palliative Medicine Team  Phone: [phone] Fax: [phone]

## 2019-02-11 NOTE — TOC Progression Note (Addendum)
Transition of Care [facility]) - Progression Note    Patient Details  Name: Troy Barrera MRN: [id number] Date of Birth: Dec 28, 1955  [facility]) CM/SW Contact  Ihor Gully, LCSW Phone Number: 02/11/2019, 5:12 PM  Clinical Narrative:    Edrick Kins, has a meeting at 9:00 a.m. on 10/8 with [facility] to take a tour.  She has a meeting at [facility] at 10:00 a.m. with palliative care. [place] has also spoken wit Claudette Head of [facility] regarding possible admission (Price is 140/day not including PT). Claudette Head said that patient could come at this rate (once nurse approves admission) for respite and then she would assist Danielle and patient in finding an independent retire community.  Referral faxed to Einar Gip with [facility] p: [phone]; fa [phone] for palliative care services.   Expected Discharge Plan: [place] Barriers to Discharge: Continued Medical Work up  Expected Discharge Plan and Services Expected Discharge Plan: [place] arrangements for the past 2 months: Single Family Home                                       Social Determinants of Health (SDOH) Interventions    Readmission Risk Interventions No flowsheet data found.

## 2019-02-12 ENCOUNTER — Encounter (HOSPITAL_COMMUNITY): Payer: Self-pay | Admitting: Gastroenterology

## 2019-02-12 ENCOUNTER — Inpatient Hospital Stay (HOSPITAL_COMMUNITY): Payer: PRIVATE HEALTH INSURANCE

## 2019-02-12 DIAGNOSIS — R6881 Early satiety: Secondary | ICD-10-CM

## 2019-02-12 DIAGNOSIS — Z7189 Other specified counseling: Secondary | ICD-10-CM

## 2019-02-12 DIAGNOSIS — E43 Unspecified severe protein-calorie malnutrition: Secondary | ICD-10-CM

## 2019-02-12 DIAGNOSIS — R188 Other ascites: Secondary | ICD-10-CM

## 2019-02-12 DIAGNOSIS — R627 Adult failure to thrive: Secondary | ICD-10-CM

## 2019-02-12 DIAGNOSIS — R933 Abnormal findings on diagnostic imaging of other parts of digestive tract: Secondary | ICD-10-CM

## 2019-02-12 DIAGNOSIS — Z515 Encounter for palliative care: Secondary | ICD-10-CM

## 2019-02-12 DIAGNOSIS — C169 Malignant neoplasm of stomach, unspecified: Secondary | ICD-10-CM

## 2019-02-12 LAB — GLUCOSE, CAPILLARY
Glucose-Capillary: 65 mg/dL — ABNORMAL LOW (ref 70–99)
Glucose-Capillary: 102 mg/dL — ABNORMAL HIGH (ref 70–99)
Glucose-Capillary: 75 mg/dL (ref 70–99)
Glucose-Capillary: 86 mg/dL (ref 70–99)

## 2019-02-12 LAB — BASIC METABOLIC PANEL
Anion gap: 6 (ref 5–15)
BUN: 19 mg/dL (ref 8–23)
CO2: 32 mmol/L (ref 22–32)
Calcium: 7 mg/dL — ABNORMAL LOW (ref 8.9–10.3)
Chloride: 103 mmol/L (ref 98–111)
Creatinine, Ser: 0.64 mg/dL (ref 0.61–1.24)
GFR calc Af Amer: >60 mL/min (ref >60)
GFR calc non Af Amer: >60 mL/min (ref >60)
Glucose, Bld: 71 mg/dL (ref 70–99)
Potassium: 4.5 mmol/L (ref 3.5–5.1)
Sodium: 141 mmol/L (ref 135–145)

## 2019-02-12 LAB — CBC
HCT: 26.3 % — ABNORMAL LOW (ref 39.0–52.0)
Hemoglobin: 8.5 g/dL — ABNORMAL LOW (ref 13.0–17.0)
MCH: 28.1 pg (ref 26.0–34.0)
MCHC: 32.3 g/dL (ref 30.0–36.0)
MCV: 87.1 fL (ref 80.0–100.0)
Platelets: 196 10*3/uL (ref 150–400)
RBC: 3.02 MIL/uL — ABNORMAL LOW (ref 4.22–5.81)
RDW: 14.9 % (ref 11.5–15.5)
WBC: 4.4 10*3/uL (ref 4.0–10.5)
nRBC: 0 % (ref 0.0–0.2)

## 2019-02-12 LAB — HEPATIC FUNCTION PANEL
ALT: 21 U/L (ref 0–44)
AST: 31 U/L (ref 15–41)
Albumin: 1.5 g/dL — ABNORMAL LOW (ref 3.5–5.0)
Alkaline Phosphatase: 113 U/L (ref 38–126)
Bilirubin, Direct: 0.2 mg/dL (ref 0.0–0.2)
Indirect Bilirubin: 0.2 mg/dL — ABNORMAL LOW (ref 0.3–0.9)
Total Bilirubin: 0.4 mg/dL (ref 0.3–1.2)
Total Protein: 4.4 g/dL — ABNORMAL LOW (ref 6.5–8.1)

## 2019-02-12 LAB — PHOSPHORUS: Phosphorus: 2.1 mg/dL — ABNORMAL LOW (ref 2.5–4.6)

## 2019-02-12 LAB — MAGNESIUM: Magnesium: 1.8 mg/dL (ref 1.7–2.4)

## 2019-02-12 MED ORDER — TUBERCULIN PPD 5 UNIT/0.1ML ID SOLN
5.0000 [IU] | Freq: Once | INTRADERMAL | Status: AC
  Administered 2019-02-12: 14:00:00 5 [IU] via INTRADERMAL
  Filled 2019-02-12: qty 0.1

## 2019-02-12 MED ORDER — PHOSPHA 250 NEUTRAL 155-852-130 MG PO TABS
500.0000 mg | ORAL_TABLET | Freq: Two times a day (BID) | ORAL | Status: DC
  Administered 2019-02-12 – 2019-02-14 (×3): 500 mg via ORAL
  Filled 2019-02-12 (×9): qty 2

## 2019-02-12 NOTE — NC FL2 (Signed)
[place] LEVEL OF CARE SCREENING TOOL     IDENTIFICATION  Patient Name: Troy Barrera Birthdate: December 02, 1955 Sex: male Admission Date (Current Location): 02/09/2019  [facility] and [place] Number:  [employer] and Address:  [place] [address] [place]      Provider Number: [id number]  Attending Physician Name and Address:  Orson Eva, MD  Relative Name and Phone Number:  [place] - daughter [phone]    Current Level of Care: Hospital Recommended Level of Care: [place] Prior Approval Number:    Date Approved/Denied:   PASRR Number: [id number]  Discharge Plan: SNF    Current Diagnoses: Patient Active Problem List   Diagnosis Date Noted  . Failure to thrive in adult 02/11/2019  . Protein-calorie malnutrition, severe 02/10/2019  . Hypokalemia 02/09/2019  . Prolonged QT interval 02/09/2019  . Hypophosphatemia 02/09/2019  . Hypernatremia 02/09/2019  . GERD (gastroesophageal reflux disease)   . Anemia   . Leukopenia   . Hypercarbia   . Encounter for chemotherapy management 03/23/2015  . Cancer of greater curvature of stomach ([place]) 02/11/2015  . Diabetes mellitus without complication ([place])   . Gastric cancer s/p subtotal gastrectomy 01/28/15 01/28/2015    Orientation RESPIRATION BLADDER Height & Weight     Self, Time, Situation, Place  Normal Continent Weight: 89 lb 15.2 oz (40.8 kg) Height:  5\' 10"  (177.8 cm)  BEHAVIORAL SYMPTOMS/MOOD NEUROLOGICAL BOWEL NUTRITION STATUS      Continent Diet(Regular, ensure supplements, Magic cup)  AMBULATORY STATUS COMMUNICATION OF NEEDS Skin   Limited Assist Verbally Other (Comment)(heels cracking)                       Personal Care Assistance Level of Assistance  Bathing, Feeding, Dressing Bathing Assistance: Limited assistance Feeding assistance: Independent Dressing Assistance: Limited assistance     Functional Limitations Info  Sight,  Hearing, Speech Sight Info: Adequate Hearing Info: Adequate Speech Info: Adequate    SPECIAL CARE FACTORS FREQUENCY  PT (By licensed PT)     PT Frequency: 3x/week              Contractures Contractures Info: Not present    Additional Factors Info  Code Status, Allergies Code Status Info: FULL Allergies Info: NKDA           Current Medications (02/12/2019):  This is the current hospital active medication list Current Facility-Administered Medications  Medication Dose Route Frequency Provider Last Rate Last Dose  . 0.9 % NaCl with KCl 20 mEq/ L  infusion   Intravenous Continuous Tat, Shanon Brow, MD 75 mL/hr at 02/12/19 0323    . acetaminophen (TYLENOL) tablet 650 mg  650 mg Oral Q6H PRN Barton Dubois, MD       Or  . acetaminophen (TYLENOL) suppository 650 mg  650 mg Rectal Q6H PRN Barton Dubois, MD      . Chlorhexidine Gluconate Cloth 2 % PADS 6 each  6 each Topical Daily Barton Dubois, MD   6 each at 02/12/19 [phone]  . Chlorhexidine Gluconate Cloth 2 % PADS 6 each  6 each Topical Daily Barton Dubois, MD   6 each at 02/12/19 [phone]  . feeding supplement (ENSURE ENLIVE) (ENSURE ENLIVE) liquid 237 mL  237 mL Oral TID BM Barton Dubois, MD   237 mL at 02/12/19 0940  . feeding supplement (PRO-STAT SUGAR FREE 64) liquid 30 mL  30 mL Oral BID Barton Dubois, MD   30 mL  at 02/12/19 0937  . Phospha 250 Neutral [phone] MG TABS 500 mg  500 mg Oral BID WC Orson Eva, MD   500 mg at 02/12/19 0937  . potassium chloride SA (KLOR-CON) CR tablet 40 mEq  40 mEq Oral BID Barton Dubois, MD   40 mEq at 02/12/19 [id number]  . tuberculin injection 5 Units  5 Units Intradermal Once Tat, David, MD         Discharge Medications: Please see discharge summary for a list of discharge medications.  Relevant Imaging Results:  Relevant Lab Results:   Additional Information SS# [id number]  Ihor Gully, LCSW

## 2019-02-12 NOTE — Consult Note (Addendum)
Referring Provider: Orson Eva, MD Primary Care Physician:  [name], MD Primary Gastroenterologist:  Dr. Wilford Corner  Reason for Consultation: History of gastric adenocarcinoma, weight loss, new thickened anastomosis  HPI: Troy Barrera is a 63 y.o. male with history of stage IIIa gastric adenocarcinoma, status post subtotal gastrectomy in September 2016 along with adjuvant radiation/chemotherapy.  Chronically with early satiety and weight loss.  Last seen by oncology in April with multiple missed or rescheduled appointments since then.  Presented to the hospital due to orthostasis, dizziness with standing.  Admitted for failure to thrive, weight loss, dehydration with electrolyte abnormality.   Patient reports progressive weakness and diminished oral intake for several months but worse especially over the last 2 to 3 weeks.  Patient states when he tries to eat he feels up too quickly.  He has to stop where he feels like he would vomit.  Denies any associated abdominal pain.  No heartburn.  No dysphagia.  States he is drinking a couple of Ensure daily at home.  Tries to eat a little something a couple of times per day.  Denies any alcohol use "for a while".  Reports normal bowel function.  No melena or rectal bleeding.  Patient has had a declining weight over the past several years.  In 2016 at time of cancer diagnosis he weighed 146 pounds.  January 2017 he was down to 134 pounds.  In March 2018 he was down to 120 pounds.  In February 2019 he was down 113 pounds.  In March 2020 he was down to 102 pounds.  In April 2020 he was down to 99 pounds.  Weight today 89.95 pounds.  Last EGD in September 2018 by Dr. Wilford Corner, the examined esophagus was normal, evidence of patent Billroth II gastrojejunostomy, anastomosis appeared healthy, segmental mild mucosal changes with congestion and erythema found at the anastomosis status post biopsy (no evidence of malignancy or H. pylori, chronic  inflammation seen).   CT chest/abdomen/pelvis as outlined below.  Esophagus appeared normal.  Status post partial gastrectomy with gastrojejunostomy.  Thickened appearance at the gastric small bowel anastomosis and thickened appearance of post anastomotic jejunum.  Dilated segment of fluid-filled small bowel up to 3.4 cm in the left lower quadrant but no convincing obstruction distal to this.  Large volume ascites throughout the abdomen and pelvis with diffuse anasarca. Moderate bilateral pleural effusions.  Gastric emptying study June 2019: Fairly good emptying of tracer over 3-hour study.   On admission hemoglobin 9.7, down to 8.5 today.  Back in March hemoglobin was 11.2.  Folate normal at 8.6, B12 normal at 297, ferritin 443, iron 35, TIBC 109, iron saturations 32%.  A1c 5.2.  Today his albumin is 1.5, LFTs otherwise normal.  Creatinine 0.64.  Potassium was 2.1 on admission but now normal.  TSH normal.  Free T4 normal.  Prior to Admission medications   Medication Sig Start Date End Date Taking? Authorizing Provider  ENSURE PLUS (ENSURE PLUS) LIQD Take 237 mLs by mouth 2 (two) times daily between meals.   Yes [provider]  metoCLOPramide (REGLAN) 10 MG tablet TAKE (1) TABLET BY MOUTH (3) TIMES DAILY BEFORE MEALS. Patient taking differently: Take 10 mg by mouth 3 (three) times daily before meals.  07/11/18  Yes Ladell Pier, MD  Multiple Vitamin (MULTIVITAMIN) tablet Take 1 tablet by mouth daily. megamend   Yes [provider]  pantoprazole (PROTONIX) 40 MG tablet TAKE 1 TABLET BY MOUTH DAILY. Patient taking differently:  Take 40 mg by mouth daily.  08/05/18  Yes Ladell Pier, MD  potassium chloride SA (K-DUR,KLOR-CON) 20 MEQ tablet Take 1 tablet (20 mEq total) by mouth 2 (two) times daily. 07/11/18  Yes Ladell Pier, MD    Current Facility-Administered Medications  Medication Dose Route Frequency Provider Last Rate Last Dose  . 0.9 % NaCl with KCl 20 mEq/ L   infusion   Intravenous Continuous Tat, Shanon Brow, MD 75 mL/hr at 02/12/19 0323    . acetaminophen (TYLENOL) tablet 650 mg  650 mg Oral Q6H PRN Barton Dubois, MD       Or  . acetaminophen (TYLENOL) suppository 650 mg  650 mg Rectal Q6H PRN Barton Dubois, MD      . Chlorhexidine Gluconate Cloth 2 % PADS 6 each  6 each Topical Daily Barton Dubois, MD   6 each at 02/11/19 [phone]  . Chlorhexidine Gluconate Cloth 2 % PADS 6 each  6 each Topical Daily Barton Dubois, MD   6 each at 02/11/19 [phone]  . feeding supplement (ENSURE ENLIVE) (ENSURE ENLIVE) liquid 237 mL  237 mL Oral TID BM Barton Dubois, MD   237 mL at 02/11/19 2117  . feeding supplement (PRO-STAT SUGAR FREE 64) liquid 30 mL  30 mL Oral BID Barton Dubois, MD   30 mL at 02/11/19 2116  . influenza vac split quadrivalent PF (FLUARIX) injection 0.5 mL  0.5 mL Intramuscular Tomorrow-1000 Reubin Milan, MD      . Phospha 250 Neutral [phone] MG TABS 500 mg  500 mg Oral BID WC Tat, David, MD      . potassium chloride SA (KLOR-CON) CR tablet 40 mEq  40 mEq Oral BID Barton Dubois, MD   40 mEq at 02/11/19 2117    Allergies as of 02/09/2019  . (No Known Allergies)    Past Medical History:  Diagnosis Date  . Cancer [facility])    gastric cancer  . Diabetes mellitus without complication ([place])   . GERD (gastroesophageal reflux disease)   . Stomach cancer ([place]) 01/28/15   gastric adenocarcinoma    Past Surgical History:  Procedure Laterality Date  . ESOPHAGOGASTRODUODENOSCOPY  01/2017   Dr. Wilford Corner: Esophagus normal, patent Billroth II gastrojejunostomy, segmental mild mucosal changes with congestion and erythema found at the anastomosis (chronic inflammation, no malignancy or H. pylori)  . ESOPHAGOGASTRODUODENOSCOPY  12/2014   Dr. Britta Mccreedy: Ulcerated mass measuring 3 x 4 cm found on the greater curve of the stomach and in the gastric body, biopsies consistent with adenocarcinoma.  . NO PAST SURGERIES    . PARTIAL GASTRECTOMY N/A  01/28/2015   Procedure: PARTIAL GASTRECTOMY;  Surgeon: Jackolyn Confer, MD;  Location: [facility];  Service: General;  Laterality: N/A;    Family History  Problem Relation Age of Onset  . Hypertension Mother   . Heart disease Father     Social History   Socioeconomic History  . Marital status: Divorced    Spouse name: Not on file  . Number of children: Not on file  . Years of education: Not on file  . Highest education level: Not on file  Occupational History  . Not on file  Social Needs  . Financial resource strain: Not on file  . Food insecurity    Worry: Not on file    Inability: Not on file  . Transportation needs    Medical: Not on file    Non-medical: Not on file  Tobacco Use  . Smoking status: Former  Smoker    Packs/day: 0.25    Years: 30.00    Pack years: 7.50    Types: Cigarettes    Quit date: 01/05/2015    Years since quitting: 4.1  . Smokeless tobacco: Never Used  Substance and Sexual Activity  . Alcohol use: Yes    Comment: beer weekends- none in last 4 months  . Drug use: No  . Sexual activity: Not on file  Lifestyle  . Physical activity    Days per week: Not on file    Minutes per session: Not on file  . Stress: Not on file  Relationships  . Social Herbalist on phone: Not on file    Gets together: Not on file    Attends religious service: Not on file    Active member of club or organization: Not on file    Attends meetings of clubs or organizations: Not on file    Relationship status: Not on file  . Intimate partner violence    Fear of current or ex partner: Not on file    Emotionally abused: Not on file    Physically abused: Not on file    Forced sexual activity: Not on file  Other Topics Concern  . Not on file  Social History Narrative   Divorced, lives alone in [place]   Works for cigarette company in [place]   Daughter, Bradley Lees is primary support     ROS:  General: Negative for fever, chills.  See HPI.   Complains of weakness. Eyes: Negative for vision changes.  ENT: Negative for hoarseness, difficulty swallowing , nasal congestion. CV: Negative for chest pain, angina, palpitations, dyspnea on exertion, peripheral edema.  Respiratory: Negative for dyspnea at rest, dyspnea on exertion, positive cough, sputum, wheezing.  GI: See history of present illness. GU:  Negative for dysuria, hematuria, urinary incontinence, urinary frequency, nocturnal urination.  MS: Negative for joint pain, low back pain.  Derm: Negative for rash or itching.  Neuro: Negative for weakness, abnormal sensation, seizure, frequent headaches, memory loss, confusion.  Psych: Negative for anxiety, depression, suicidal ideation, hallucinations.  Endo: See HPI Heme: Negative for bruising or bleeding. Allergy: Negative for rash or hives.       Physical Examination: Vital signs in last 24 hours: Temp:  [98.1 F (36.7 C)-98.5 F (36.9 C)] 98.1 F (36.7 C) (10/08 0517) Pulse Rate:  [74-107] 76 (10/08 0517) Resp:  [16-18] 16 (10/08 0517) BP: (96-98)/(72-85) 98/77 (10/08 0517) SpO2:  [99 %-100 %] 99 % (10/08 0517) Last BM Date: 02/09/19  General: Thin cachectic appearing male in no acute distress.  Head: Normocephalic, atraumatic.   Eyes: Conjunctiva pink, no icterus. Mouth: Oropharyngeal mucosa moist and pink , no lesions erythema or exudate. Neck: Supple without thyromegaly, masses, or lymphadenopathy.  Lungs: Clear to auscultation bilaterally.  Heart: Regular rate and rhythm, no murmurs rubs or gallops.  Abdomen: Bowel sounds are normal, nontender, nondistended, no hepatosplenomegaly or masses, no abdominal bruits or    hernia , no rebound or guarding.   Rectal: Not performed Extremities: Bilateral 1-2+ hitting edema below the knees, no clubbing, deformity.  Neuro: Alert and oriented x 4 , grossly normal neurologically.  Skin: Warm and dry, no rash or jaundice.   Psych: Alert and cooperative, normal mood and  affect.        Intake/Output from previous day: 10/07 0701 - 10/08 0700 In: 1001.4 [I.V.:1001.4] Out: 400 [Urine:400] Intake/Output this shift: No intake/output data recorded.  Lab Results:  CBC Recent Labs    02/09/19 1845 02/10/19 0422 02/12/19 0724  WBC 3.5* 9.1 4.4  HGB 9.7* 8.8* 8.5*  HCT 29.9* 26.6* 26.3*  MCV 87.2 86.4 87.1  PLT 196 169 196   BMET Recent Labs    02/10/19 1355 02/11/19 0427 02/12/19 0724  NA 142 141 141  K 3.3* 3.4* 4.5  CL 97* 97* 103  CO2 36* 35* 32  GLUCOSE 161* 105* 71  BUN 17 17 19   CREATININE 0.77 0.65 0.64  CALCIUM 6.9* 6.9* 7.0*   LFT Recent Labs    02/09/19 1845 02/10/19 0422 02/12/19 0748  BILITOT 0.4 0.6 0.4  BILIDIR  --   --  0.2  IBILI  --   --  0.2*  ALKPHOS 110 101 113  AST 29 27 31   ALT 18 17 21   PROT 4.7* 4.5* 4.4*  ALBUMIN 1.8* 1.6* 1.5*   Lab Results  Component Value Date   TSH 4.483 02/11/2019    Lipase No results for input(s): LIPASE in the last 72 hours.  PT/INR No results for input(s): LABPROT, INR in the last 72 hours.    Imaging Studies: Ct Chest W Contrast  Result Date: 02/11/2019 CLINICAL DATA:  Unintended weight loss abdominal pain history of gastric cancer EXAM: CT CHEST, ABDOMEN, AND PELVIS WITH CONTRAST TECHNIQUE: Multidetector CT imaging of the chest, abdomen and pelvis was performed following the standard protocol during bolus administration of intravenous contrast. CONTRAST:  87mL OMNIPAQUE IOHEXOL 300 MG/ML SOLN, 24mL OMNIPAQUE IOHEXOL 300 MG/ML SOLN COMPARISON:  Chest x-ray 02/09/2019, CT 04/08/2017, 11/30/2016 FINDINGS: CT CHEST FINDINGS Cardiovascular: Nonaneurysmal aorta. Mild aortic atherosclerosis. No dissection is seen. Coronary calcifications. Normal heart size. Small pericardial effusion at the cardiac base. Mediastinum/Nodes: No thyroid mass. Midline trachea. No significant mediastinal adenopathy. Esophagus within normal limits. Lungs/Pleura: Moderate bilateral pleural effusions. Mild  emphysematous disease. Passive atelectasis at the lower lobes. Musculoskeletal: Sternum is intact. Vertebral bodies demonstrate normal stature. No acute or suspicious osseous abnormality. CT ABDOMEN PELVIS FINDINGS Hepatobiliary: Circumscribed low-density lesions in the liver consistent with cysts. No change. No calcified gallstone or biliary dilatation Pancreas: Atrophic.  No ductal dilatation. Spleen: Normal in size without focal abnormality. Adrenals/Urinary Tract: Stable left adrenal gland nodularity. Negative right adrenal gland. Kidneys show no hydronephrosis. Subcentimeter hypodensities in the mid right kidney too small to further characterize. Cyst in the lower pole of the right kidney. Urinary bladder unremarkable. Stomach/Bowel: Status post partial gastrectomy with gastrojejunostomy. Thickened appearance at the gastric small bowel anastomosis and thickened appearance of post anastomotic jejunum. Dilated segment of fluid-filled small bowel up to 3.4 cm in the left lower quadrant but no convincing obstruction distal to this. Negative for colon wall thickening. Negative appendix. Vascular/Lymphatic: Moderate aortic atherosclerosis. Increased mural thrombus right side of the distal descending thoracic aorta and the proximal abdominal aorta. No aneurysm. SMA grossly patent. No grossly enlarged lymph nodes Reproductive: Prostate is unremarkable. Other: No free air. Large volume of ascites throughout the abdomen and pelvis. Diffuse edema within the subcutaneous soft tissues of the body wall. Musculoskeletal: No acute or suspicious osseous abnormality. IMPRESSION: 1. Difficult study secondary to ataxia and generalized edema and ascites. Status post partial gastrectomy with gastrojejunostomy. Thickened appearance at the gastrojejunal anastomosis and thickened appearance of the post anastomotic small bowel, somewhat more prominent compared to prior. Findings could be secondary to infection, inflammation, or  recurrent disease. Consider correlation with direct visualization. 2. Dilated loop of small bowel in the left lower quadrant though no convincing evidence  for a bowel obstruction 3. Large volume of ascites throughout the abdomen and pelvis with diffuse anasarca. Moderate bilateral pleural effusions. Electronically Signed   By: Donavan Foil M.D.   On: 02/11/2019 23:57   Ct Abdomen Pelvis W Contrast  Result Date: 02/11/2019 CLINICAL DATA:  Unintended weight loss abdominal pain history of gastric cancer EXAM: CT CHEST, ABDOMEN, AND PELVIS WITH CONTRAST TECHNIQUE: Multidetector CT imaging of the chest, abdomen and pelvis was performed following the standard protocol during bolus administration of intravenous contrast. CONTRAST:  33mL OMNIPAQUE IOHEXOL 300 MG/ML SOLN, 82mL OMNIPAQUE IOHEXOL 300 MG/ML SOLN COMPARISON:  Chest x-ray 02/09/2019, CT 04/08/2017, 11/30/2016 FINDINGS: CT CHEST FINDINGS Cardiovascular: Nonaneurysmal aorta. Mild aortic atherosclerosis. No dissection is seen. Coronary calcifications. Normal heart size. Small pericardial effusion at the cardiac base. Mediastinum/Nodes: No thyroid mass. Midline trachea. No significant mediastinal adenopathy. Esophagus within normal limits. Lungs/Pleura: Moderate bilateral pleural effusions. Mild emphysematous disease. Passive atelectasis at the lower lobes. Musculoskeletal: Sternum is intact. Vertebral bodies demonstrate normal stature. No acute or suspicious osseous abnormality. CT ABDOMEN PELVIS FINDINGS Hepatobiliary: Circumscribed low-density lesions in the liver consistent with cysts. No change. No calcified gallstone or biliary dilatation Pancreas: Atrophic. No ductal dilatation. Spleen: Normal in size without focal abnormality. Adrenals/Urinary Tract: Stable left adrenal gland nodularity. Negative right adrenal gland. Kidneys show no hydronephrosis. Subcentimeter hypodensities in the mid right kidney too small to further characterize. Cyst in the lower  pole of the right kidney. Urinary bladder unremarkable. Stomach/Bowel: Status post partial gastrectomy with gastrojejunostomy. Thickened appearance at the gastric small bowel anastomosis and thickened appearance of post anastomotic jejunum. Dilated segment of fluid-filled small bowel up to 3.4 cm in the left lower quadrant but no convincing obstruction distal to this. Negative for colon wall thickening. Negative appendix. Vascular/Lymphatic: Moderate aortic atherosclerosis. Increased mural thrombus right side of the distal descending thoracic aorta and the proximal abdominal aorta. No aneurysm. SMA grossly patent. No grossly enlarged lymph nodes Reproductive: Prostate is unremarkable. Other: No free air. Large volume of ascites throughout the abdomen and pelvis. Diffuse edema within the subcutaneous soft tissues of the body wall. Musculoskeletal: No acute or suspicious osseous abnormality. IMPRESSION: 1. Difficult study secondary to ataxia and generalized edema and ascites. Status post partial gastrectomy with gastrojejunostomy. Thickened appearance at the gastrojejunal anastomosis and thickened appearance of the post anastomotic small bowel, somewhat more prominent compared to prior. Findings could be secondary to infection, inflammation, or recurrent disease. Consider correlation with direct visualization. 2. Dilated loop of small bowel in the left lower quadrant though no convincing evidence for a bowel obstruction 3. Large volume of ascites throughout the abdomen and pelvis with diffuse anasarca. Moderate bilateral pleural effusions. Electronically Signed   By: Donavan Foil M.D.   On: 02/11/2019 23:58   Dg Chest Portable 1 View  Result Date: 02/09/2019 CLINICAL DATA:  Witnessed syncopal episode today. EXAM: PORTABLE CHEST 1 VIEW COMPARISON:  None. FINDINGS: Heart size and mediastinal contours are within normal limits. Vague opacity at the LEFT lung base, suspected small layering pleural effusion. Lungs  otherwise clear. No pneumothorax seen. Osseous structures about the chest are unremarkable. IMPRESSION: 1. Vague opacity at the LEFT lung base, suspected small layering pleural effusion. 2. Lungs otherwise clear. No evidence of pneumonia or pulmonary edema. Electronically Signed   By: Franki Cabot M.D.   On: 02/09/2019 19:00  [4 week]   Impression: Pleasant 63 year old gentleman with history of stage IIIa gastric adenocarcinoma status post subtotal gastrectomy/chemo/radiation in 2016 presenting with failure to  thrive, dehydration with electrolyte abnormalities.  Complains of several month history of progressive weight loss.  He has chronic early satiety with worsening symptoms and inability to maintain his weight with oral intake.  CT this admission with thickened appearance at the gastrojejunal anastomosis and thickened appearance of the post anastomotic small bowel, somewhat more prominent compared to prior study.  Patient with profound malnutrition.  Over 50 pound weight loss over the last 3 to 4 years, 10 pounds (10% of body weight) since April.  Anemia: Likely multifactorial, anemia of chronic disease.  No overt GI bleeding.  Plan: 1. Offered patient upper endoscopy for further evaluation of symptoms and CT findings.  He declined.  He requested to speak with his daughter to let her know of our recommendations and that he has declined upper endoscopy.  We discussed possibility of recurrence of his cancer, he states that "I am cancer free".  He states that he does want to live and that he is going to try to "do better" eating.  If he does not have endoscopy while inpatient, will consider follow-up with Dr. Michail Sermon as an outpatient to reassess. 2. Recommend 5-6 small meals daily.  Nutritional supplements as per dietitian's recommendations.   3. I will touch base with patient's daughter, Luecht, later today., as she is currently meeting with palliative medicine.  We would like to thank you for the  opportunity to participate in the care of Troy Barrera.  Laureen Ochs. Bernarda Caffey [facility] [phone] 10/8/202011:08 AM     LOS: 2 days     Addendum: spoke to patient's daughter, Feest. During meeting with palliative, patient agreed to EGD. Will plan for tomorrow with Dr. Oneida Alar.   Laureen Ochs. Bernarda Caffey [facility] [phone] 10/8/20201:29 PM

## 2019-02-12 NOTE — Progress Notes (Signed)
PT Cancellation Note  Patient Details Name: Troy Barrera MRN: [id number] DOB: [birth year]   Cancelled Treatment:    Reason Eval/Treat Not Completed: Patient at procedure or test/unavailable.  Will check back in PM if time permits.   12:06 PM, 02/12/19 Lonell Grandchild, MPT Physical Therapist with [facility] [phone] office [phone] mobile phone'

## 2019-02-12 NOTE — Progress Notes (Signed)
PROGRESS NOTE  Troy Barrera [id number] DOB: [birth year] DOA: 02/09/2019 PCP: [name], MD  Brief History:  63 year old male with a history of gastric adenocarcinoma diagnosed sept 2016 s/p gastrojejunostomy and diabetes mellitus type 2 presenting with 2 to 3-week history of progressive weakness worsening in the 2 days prior to admission.  The patient was beginning to feel dizzy when he stood up and this resulted in a fall backwards onto his couch.  He did not hit his head.  He did not lose consciousness.  In the past 2 to 3 weeks, the patient has had decreased oral intake.  He states that he has lost an additional 15 pounds.  He lives by himself and does not have any one to assist him in his ADLs.  Because of his progressive weakness and gait instability and failure to thrive, the patient was admitted for further evaluation.  Assessment/Plan: Failure to thrive/weight loss -Folic acid 8.6 -[id number] -CT chest/abd/pelvis--moderate bilateral pleural effusion, mild emphysema, unchanged liver cysts, atrophic pancreas, thickened appearance epigastric small bowel anastomosis and thickened post anastomotic jejunum.  Diffuse anasarca -TSH--4.483 -mag--1.9 -PT eval-->SNF -continue ensure -check UA--no pyuria  Dehydration/hypernatremia -initially started on hypotonic fluid with improvement -Continue IV fluids as the patient's oral intake remains extremely poor -am BMP  Hypokalemia -Repleted  Hypophosphatemia -Repleted -Recheck phosphorus  Diabetes mellitus type 2 -02/09/2019 hemoglobin A1c--5.2 -Allow for liberal glycemic control at this point  Gastric adenocarcinoma stage IIIA -04/09/17 CT chest/abdomen/pelvis--no recurrence -02/11/19 CT chest/abd/pelvis--as above--discussed with Dr. Benay Spice -GI consulted-->plan EGD 10/9 -10/9--insufficient ascites for paracentesis -follows Dr. Benay Spice -Status post partial gastrectomy  Severe Malnutrition -continue  ensure  Goals of care -consult palliative medicine per daughter request     Disposition Plan:   SNF in 1-2 days  Family Communication:   Daughter updated at bedside 10/8  Consultants:none    Code Status:  FULL   DVT Prophylaxis:   [place] Lovenox   Procedures: As Listed in Progress Note Above  Antibiotics: None  Total time spent 35 minutes.  Greater than 50% spent face to face counseling and coordinating care.    Subjective: Patient denies fevers, chills, headache, chest pain, dyspnea, nausea, vomiting, diarrhea, abdominal pain, dysuria, hematuria, hematochezia, and melena.   Objective: Vitals:   02/11/19 1142 02/11/19 2023 02/12/19 0517 02/12/19 1257  BP: 96/85 98/72 98/77  98/79  Pulse: (!) 107 74 76 68  Resp: 18 16 16 17   Temp: 98.4 F (36.9 C) 98.5 F (36.9 C) 98.1 F (36.7 C) 97.8 F (36.6 C)  TempSrc: Oral   Oral  SpO2:  100% 99% 100%  Weight:      Height:        Intake/Output Summary (Last 24 hours) at 02/12/2019 1742 Last data filed at 02/12/2019 1300 Gross per 24 hour  Intake 1361.36 ml  Output 400 ml  Net 961.36 ml   Weight change:  Exam:   General:  Pt is alert, follows commands appropriately, not in acute distress  HEENT: No icterus, No thrush, No neck mass, [place]/AT  Cardiovascular: RRR, S1/S2, no rubs, no gallops  Respiratory: CTA bilaterally, no wheezing, no crackles, no rhonchi  Abdomen: Soft/+BS, non tender, non distended, no guarding  Extremities: No edema, No lymphangitis, No petechiae, No rashes, no synovitis   Data Reviewed: I have personally reviewed following labs and imaging studies Basic Metabolic Panel: Recent Labs  Lab 02/09/19 1845 02/10/19 0422 02/10/19 1355 02/11/19 0427 02/11/19 1352 02/12/19 [id number]  NA 146* 143 142 141  --  141  K 2.1* 2.9* 3.3* 3.4*  --  4.5  CL 98 99 97* 97*  --  103  CO2 39* 36* 36* 35*  --  32  GLUCOSE 109* 76 161* 105*  --  71  BUN 16 16 17 17   --  19  CREATININE 0.78 0.77  0.77 0.65  --  0.64  CALCIUM 6.9* 6.9* 6.9* 6.9*  --  7.0*  MG 2.0  --   --  1.9  --  1.8  PHOS 1.5*  --  2.6  --  2.3* 2.1*   Liver Function Tests: Recent Labs  Lab 02/09/19 1845 02/10/19 0422 02/12/19 0748  AST 29 27 31   ALT 18 17 21   ALKPHOS 110 101 113  BILITOT 0.4 0.6 0.4  PROT 4.7* 4.5* 4.4*  ALBUMIN 1.8* 1.6* 1.5*   No results for input(s): LIPASE, AMYLASE in the last 168 hours. No results for input(s): AMMONIA in the last 168 hours. Coagulation Profile: No results for input(s): INR, PROTIME in the last 168 hours. CBC: Recent Labs  Lab 02/09/19 1845 02/10/19 0422 02/12/19 0724  WBC 3.5* 9.1 4.4  NEUTROABS 2.8  --   --   HGB 9.7* 8.8* 8.5*  HCT 29.9* 26.6* 26.3*  MCV 87.2 86.4 87.1  PLT 196 169 196   Cardiac Enzymes: No results for input(s): CKTOTAL, CKMB, CKMBINDEX, TROPONINI in the last 168 hours. BNP: Invalid input(s): POCBNP CBG: Recent Labs  Lab 02/11/19 1619 02/11/19 2021 02/12/19 0731 02/12/19 1116 02/12/19 1626  GLUCAP 191* 156* 65* 102* 75   HbA1C: Recent Labs    02/09/19 1845  HGBA1C 5.2   Urine analysis:    Component Value Date/Time   COLORURINE YELLOW 02/11/2019 2230   APPEARANCEUR CLEAR 02/11/2019 2230   LABSPEC 1.032 (H) 02/11/2019 2230   PHURINE 7.0 02/11/2019 2230   GLUCOSEU NEGATIVE 02/11/2019 2230   HGBUR NEGATIVE 02/11/2019 2230   BILIRUBINUR NEGATIVE 02/11/2019 2230   KETONESUR NEGATIVE 02/11/2019 2230   PROTEINUR 30 (A) 02/11/2019 2230   NITRITE NEGATIVE 02/11/2019 2230   LEUKOCYTESUR NEGATIVE 02/11/2019 2230   Sepsis Labs: @LABRCNTIP (procalcitonin:4,lacticidven:4) ) Recent Results (from the past 240 hour(s))  SARS Coronavirus 2 [facility] order, Performed in [facility] hospital lab) Nasopharyngeal Nasopharyngeal Swab     Status: None   Collection Time: 02/09/19  8:10 PM   Specimen: Nasopharyngeal Swab  Result Value Ref Range Status   SARS Coronavirus 2 NEGATIVE NEGATIVE Final    Comment: (NOTE) If result is  NEGATIVE SARS-CoV-2 target nucleic acids are NOT DETECTED. The SARS-CoV-2 RNA is generally detectable in upper and lower  respiratory specimens during the acute phase of infection. The lowest  concentration of SARS-CoV-2 viral copies this assay can detect is 250  copies / mL. A negative result does not preclude SARS-CoV-2 infection  and should not be used as the sole basis for treatment or other  patient management decisions.  A negative result may occur with  improper specimen collection / handling, submission of specimen other  than nasopharyngeal swab, presence of viral mutation(s) within the  areas targeted by this assay, and inadequate number of viral copies  (<250 copies / mL). A negative result must be combined with clinical  observations, patient history, and epidemiological information. If result is POSITIVE SARS-CoV-2 target nucleic acids are DETECTED. The SARS-CoV-2 RNA is generally detectable in upper and lower  respiratory specimens dur ing the acute phase of infection.  Positive  results  are indicative of active infection with SARS-CoV-2.  Clinical  correlation with patient history and other diagnostic information is  necessary to determine patient infection status.  Positive results do  not rule out bacterial infection or co-infection with other viruses. If result is PRESUMPTIVE POSTIVE SARS-CoV-2 nucleic acids MAY BE PRESENT.   A presumptive positive result was obtained on the submitted specimen  and confirmed on repeat testing.  While 2019 novel coronavirus  (SARS-CoV-2) nucleic acids may be present in the submitted sample  additional confirmatory testing may be necessary for epidemiological  and / or clinical management purposes  to differentiate between  SARS-CoV-2 and other Sarbecovirus currently known to infect humans.  If clinically indicated additional testing with an alternate test  methodology [phone]) is advised. The SARS-CoV-2 RNA is generally  detectable  in upper and lower respiratory sp ecimens during the acute  phase of infection. The expected result is Negative. Fact Sheet for Patients:  StrictlyIdeas.no Fact Sheet for Healthcare Providers: BankingDealers.co.za This test is not yet approved or cleared by the [place] FDA and has been authorized for detection and/or diagnosis of SARS-CoV-2 by FDA under an Emergency Use Authorization (EUA).  This EUA will remain in effect (meaning this test can be used) for the duration of the COVID-19 declaration under Section 564(b)(1) of the Act, 21 U.S.C. section 360bbb-3(b)(1), unless the authorization is terminated or revoked sooner. Performed at [facility], [address] [place], [place] [address]   MRSA PCR Screening     Status: None   Collection Time: 02/09/19 10:09 PM   Specimen: Nasal Mucosa; Nasopharyngeal  Result Value Ref Range Status   MRSA by PCR NEGATIVE NEGATIVE Final    Comment:        The GeneXpert MRSA Assay (FDA approved for NASAL specimens only), is one component of a comprehensive MRSA colonization surveillance program. It is not intended to diagnose MRSA infection nor to guide or monitor treatment for MRSA infections. Performed at [facility], [address] [place], [place] [address]      Scheduled Meds:  Chlorhexidine Gluconate Cloth  6 each Topical Daily   Chlorhexidine Gluconate Cloth  6 each Topical Daily   feeding supplement (ENSURE ENLIVE)  237 mL Oral TID BM   feeding supplement (PRO-STAT SUGAR FREE 64)  30 mL Oral BID   Phospha 250 Neutral  500 mg Oral BID WC   potassium chloride SA  40 mEq Oral BID   tuberculin  5 Units Intradermal Once   Continuous Infusions:  Procedures/Studies: Ct Chest W Contrast  Result Date: 02/11/2019 CLINICAL DATA:  Unintended weight loss abdominal pain history of gastric cancer EXAM: CT CHEST, ABDOMEN, AND PELVIS WITH CONTRAST TECHNIQUE: Multidetector CT imaging  of the chest, abdomen and pelvis was performed following the standard protocol during bolus administration of intravenous contrast. CONTRAST:  60mL OMNIPAQUE IOHEXOL 300 MG/ML SOLN, 60mL OMNIPAQUE IOHEXOL 300 MG/ML SOLN COMPARISON:  Chest x-ray 02/09/2019, CT 04/08/2017, 11/30/2016 FINDINGS: CT CHEST FINDINGS Cardiovascular: Nonaneurysmal aorta. Mild aortic atherosclerosis. No dissection is seen. Coronary calcifications. Normal heart size. Small pericardial effusion at the cardiac base. Mediastinum/Nodes: No thyroid mass. Midline trachea. No significant mediastinal adenopathy. Esophagus within normal limits. Lungs/Pleura: Moderate bilateral pleural effusions. Mild emphysematous disease. Passive atelectasis at the lower lobes. Musculoskeletal: Sternum is intact. Vertebral bodies demonstrate normal stature. No acute or suspicious osseous abnormality. CT ABDOMEN PELVIS FINDINGS Hepatobiliary: Circumscribed low-density lesions in the liver consistent with cysts. No change. No calcified gallstone or biliary dilatation Pancreas: Atrophic.  No ductal dilatation. Spleen: Normal in size without focal abnormality. Adrenals/Urinary Tract: Stable left adrenal gland nodularity. Negative right adrenal gland. Kidneys show no hydronephrosis. Subcentimeter hypodensities in the mid right kidney too small to further characterize. Cyst in the lower pole of the right kidney. Urinary bladder unremarkable. Stomach/Bowel: Status post partial gastrectomy with gastrojejunostomy. Thickened appearance at the gastric small bowel anastomosis and thickened appearance of post anastomotic jejunum. Dilated segment of fluid-filled small bowel up to 3.4 cm in the left lower quadrant but no convincing obstruction distal to this. Negative for colon wall thickening. Negative appendix. Vascular/Lymphatic: Moderate aortic atherosclerosis. Increased mural thrombus right side of the distal descending thoracic aorta and the proximal abdominal aorta. No  aneurysm. SMA grossly patent. No grossly enlarged lymph nodes Reproductive: Prostate is unremarkable. Other: No free air. Large volume of ascites throughout the abdomen and pelvis. Diffuse edema within the subcutaneous soft tissues of the body wall. Musculoskeletal: No acute or suspicious osseous abnormality. IMPRESSION: 1. Difficult study secondary to ataxia and generalized edema and ascites. Status post partial gastrectomy with gastrojejunostomy. Thickened appearance at the gastrojejunal anastomosis and thickened appearance of the post anastomotic small bowel, somewhat more prominent compared to prior. Findings could be secondary to infection, inflammation, or recurrent disease. Consider correlation with direct visualization. 2. Dilated loop of small bowel in the left lower quadrant though no convincing evidence for a bowel obstruction 3. Large volume of ascites throughout the abdomen and pelvis with diffuse anasarca. Moderate bilateral pleural effusions. Electronically Signed   By: Donavan Foil M.D.   On: 02/11/2019 23:57   Ct Abdomen Pelvis W Contrast  Result Date: 02/11/2019 CLINICAL DATA:  Unintended weight loss abdominal pain history of gastric cancer EXAM: CT CHEST, ABDOMEN, AND PELVIS WITH CONTRAST TECHNIQUE: Multidetector CT imaging of the chest, abdomen and pelvis was performed following the standard protocol during bolus administration of intravenous contrast. CONTRAST:  17mL OMNIPAQUE IOHEXOL 300 MG/ML SOLN, 32mL OMNIPAQUE IOHEXOL 300 MG/ML SOLN COMPARISON:  Chest x-ray 02/09/2019, CT 04/08/2017, 11/30/2016 FINDINGS: CT CHEST FINDINGS Cardiovascular: Nonaneurysmal aorta. Mild aortic atherosclerosis. No dissection is seen. Coronary calcifications. Normal heart size. Small pericardial effusion at the cardiac base. Mediastinum/Nodes: No thyroid mass. Midline trachea. No significant mediastinal adenopathy. Esophagus within normal limits. Lungs/Pleura: Moderate bilateral pleural effusions. Mild  emphysematous disease. Passive atelectasis at the lower lobes. Musculoskeletal: Sternum is intact. Vertebral bodies demonstrate normal stature. No acute or suspicious osseous abnormality. CT ABDOMEN PELVIS FINDINGS Hepatobiliary: Circumscribed low-density lesions in the liver consistent with cysts. No change. No calcified gallstone or biliary dilatation Pancreas: Atrophic. No ductal dilatation. Spleen: Normal in size without focal abnormality. Adrenals/Urinary Tract: Stable left adrenal gland nodularity. Negative right adrenal gland. Kidneys show no hydronephrosis. Subcentimeter hypodensities in the mid right kidney too small to further characterize. Cyst in the lower pole of the right kidney. Urinary bladder unremarkable. Stomach/Bowel: Status post partial gastrectomy with gastrojejunostomy. Thickened appearance at the gastric small bowel anastomosis and thickened appearance of post anastomotic jejunum. Dilated segment of fluid-filled small bowel up to 3.4 cm in the left lower quadrant but no convincing obstruction distal to this. Negative for colon wall thickening. Negative appendix. Vascular/Lymphatic: Moderate aortic atherosclerosis. Increased mural thrombus right side of the distal descending thoracic aorta and the proximal abdominal aorta. No aneurysm. SMA grossly patent. No grossly enlarged lymph nodes Reproductive: Prostate is unremarkable. Other: No free air. Large volume of ascites throughout the abdomen and pelvis. Diffuse edema within the subcutaneous soft tissues of the body wall. Musculoskeletal: No acute  or suspicious osseous abnormality. IMPRESSION: 1. Difficult study secondary to ataxia and generalized edema and ascites. Status post partial gastrectomy with gastrojejunostomy. Thickened appearance at the gastrojejunal anastomosis and thickened appearance of the post anastomotic small bowel, somewhat more prominent compared to prior. Findings could be secondary to infection, inflammation, or recurrent  disease. Consider correlation with direct visualization. 2. Dilated loop of small bowel in the left lower quadrant though no convincing evidence for a bowel obstruction 3. Large volume of ascites throughout the abdomen and pelvis with diffuse anasarca. Moderate bilateral pleural effusions. Electronically Signed   By: Donavan Foil M.D.   On: 02/11/2019 23:58   Dg Chest Portable 1 View  Result Date: 02/09/2019 CLINICAL DATA:  Witnessed syncopal episode today. EXAM: PORTABLE CHEST 1 VIEW COMPARISON:  None. FINDINGS: Heart size and mediastinal contours are within normal limits. Vague opacity at the LEFT lung base, suspected small layering pleural effusion. Lungs otherwise clear. No pneumothorax seen. Osseous structures about the chest are unremarkable. IMPRESSION: 1. Vague opacity at the LEFT lung base, suspected small layering pleural effusion. 2. Lungs otherwise clear. No evidence of pneumonia or pulmonary edema. Electronically Signed   By: Franki Cabot M.D.   On: 02/09/2019 19:00   [place] Ascites (abdomen Limited)  Result Date: 02/12/2019 CLINICAL DATA:  Gastric cancer, ascites EXAM: LIMITED ABDOMEN ULTRASOUND FOR ASCITES TECHNIQUE: Limited ultrasound survey for ascites was performed in all four abdominal quadrants. COMPARISON:  CT abdomen and pelvis 02/11/2019 FINDINGS: Small amount of ascites seen in the lower quadrants bilaterally. Small amount of perihepatic free fluid is also seen. Small RIGHT pleural effusion. Insufficient pocket of ascites for paracentesis. Majority of the ascites seen on the prior CT exam was deep in the pelvis, not accessible. IMPRESSION: Insufficient ascites for paracentesis. Electronically Signed   By: Lavonia Dana M.D.   On: 02/12/2019 12:23    Orson Eva, DO  [facility] Pager [phone]  If 7PM-7AM, please contact night-coverage [email or web address] Password [email or web address] 02/12/2019, 5:42 PM   LOS: 2 days

## 2019-02-12 NOTE — Progress Notes (Signed)
Physical Therapy Treatment Patient Details Name: Troy Barrera MRN: [id number] DOB: [birth year] Today's Date: 02/12/2019    History of Present Illness Troy Barrera is a 63 y.o. male with medical history significant of gastric cancer, type 2 diabetes, GERD who is coming to the emergency department via EMS after having progressively worse weakness for the past few weeks, but particularly has felt weak over the weekend.  He says that he has been having trouble to walk across the room due to weakness.  He denies fever, but complains of chills/feeling cold.  No headache, sore throat or rhinorrhea.  His appetite is decreased, but denies abdominal pain, nausea or emesis, diarrhea, constipation, melena or hematochezia.  He has lost another 15 pounds of weight in the past few months.  He complains of mild dyspnea, but denies wheezing, productive cough or hemoptysis.  No chest pain, palpitations, diaphoresis, dizziness, but EMS earlier today described him as hypotensive and near syncopal.  His BP after a 500 mL bolus given by EMS was only 90/56 mmHg.  He has mild lower extremity edema, but denies PND or orthopnea.  No dysuria, frequency or hematuria.  No polyuria, polydipsia, polyphagia or blurred vision.    PT Comments    Patient able to complete bed mobility modified independent with slow, labored movements. He is able to complete exercises at edge of bed unsupported with good sitting balance. He has difficulty with LAQ and hip flexion marches secondary to hip flexor and quad weakness. Patient did not want to get out of bed to stand or walk. Patient requires min assist to lift LE back into bed. Patient continues to have impaired LE strength and endurance. Patient ended session back in bed - RN notified. Patient will benefit from continued physical therapy in hospital and recommended venue below to increase strength, balance, endurance for safe ADLs and gait.    Follow Up Recommendations  SNF;Supervision -  Intermittent;Supervision for mobility/OOB     Equipment Recommendations  None recommended by PT;Rolling walker with 5" wheels    Recommendations for Other Services       Precautions / Restrictions Precautions Precautions: Fall Restrictions Weight Bearing Restrictions: No    Mobility  Bed Mobility Overal bed mobility: Needs Assistance Bed Mobility: Supine to Sit;Sit to Supine     Supine to sit: Modified independent (Device/Increase time) Sit to supine: Min assist   General bed mobility comments: slow labored movement  Transfers                 General transfer comment: patient did not want to stand up  Ambulation/Gait                 Stairs             Wheelchair Mobility    Modified Rankin (Stroke Patients Only)       Balance Overall balance assessment: Needs assistance Sitting-balance support: Feet supported;No upper extremity supported Sitting balance-Leahy Scale: Good Sitting balance - Comments: good seated at bedside                                    Cognition Arousal/Alertness: Awake/alert Behavior During Therapy: WFL for tasks assessed/performed Overall Cognitive Status: Within Functional Limits for tasks assessed  Exercises General Exercises - Lower Extremity Long Arc Quad: AROM;Both;15 reps;Seated Heel Slides: AROM;Both;15 reps;Seated Hip Flexion/Marching: AAROM;Both;10 reps;Seated(had difficulty completing AROM secondary to weakness) Toe Raises: AROM;Both;Seated;15 reps Heel Raises: AROM;Both;15 reps;Seated    General Comments        Pertinent Vitals/Pain Pain Assessment: No/denies pain    Home Living                      Prior Function            PT Goals (current goals can now be found in the care plan section) Acute Rehab PT Goals Patient Stated Goal: return home with his daughter to assist PT Goal Formulation: With  patient Time For Goal Achievement: 02/24/19 Potential to Achieve Goals: Good Progress towards PT goals: Progressing toward goals    Frequency    Min 3X/week      PT Plan      Co-evaluation              AM-PAC PT "6 Clicks" Mobility   Outcome Measure  Help needed turning from your back to your side while in a flat bed without using bedrails?: None Help needed moving from lying on your back to sitting on the side of a flat bed without using bedrails?: A Little Help needed moving to and from a bed to a chair (including a wheelchair)?: A Little Help needed standing up from a chair using your arms (e.g., wheelchair or bedside chair)?: A Little Help needed to walk in hospital room?: A Lot Help needed climbing 3-5 steps with a railing? : A Lot 6 Click Score: 17    End of Session   Activity Tolerance: Patient tolerated treatment well;Patient limited by fatigue Patient left: in bed;with call bell/phone within reach;with family/visitor present Nurse Communication: Mobility status PT Visit Diagnosis: Unsteadiness on feet (R26.81);Other abnormalities of gait and mobility (R26.89);Muscle weakness (generalized) (M62.81)     Time: [id number] PT Time Calculation (min) (ACUTE ONLY): 14 min  Charges:  $Therapeutic Activity: 8-22 mins                     3:37 PM, 02/12/19 Mearl Latin PT, DPT Physical Therapist at [facility]

## 2019-02-12 NOTE — Consult Note (Signed)
Consultation Note Date: 02/12/2019   Patient Name: Troy Barrera  DOB: July 30, 1955  MRN: [id number]  Age / Sex: 63 y.o., male  PCP: [name], MD Referring Physician: Orson Eva, MD  Reason for Consultation: Establishing goals of care  HPI/Patient Profile: 63 y.o. 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 Patient reports he has lost an additional 15 lbs. Hospital admission for further evaluation due to progressive weakness and failure to thrive. CT chest/ab/pelvis reveal finding secondary to infection, inflammation or recurrent disease. GI consulted with plan to perform EGD 10/9. Palliative medicine consultation requested by daughter.   Clinical Assessment and Goals of Care:  I have reviewed medical records, discussed with Dr. Carles Collet, and met with patient and daughter Troy Barrera) at bedside x2 today to discuss diagnosis, [place], EOL wishes, disposition and options.  Introduced Palliative Medicine as specialized medical care for people living with serious illness. It focuses on providing relief from the symptoms and stress of a serious illness. The goal is to improve quality of life for both the patient and the family.  We discussed a brief life review of the patient. Prior to admission, living home alone and able to care for himself. He is divorced, has two children, and one grandchild. Suffered with cancer in 2016 s/p surgery and was able to return to work. 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 Daughter reports his weight loss began this past June.   Discussed course of hospitalization including diagnoses, interventions, and plan of care. Reviewed CT results and GI consult and GI consult. Patient has agreed to have EGD tomorrow.   Patient irritable  throughout conversation. He continues to tell care team that he does not have recurrent cancer. (Daughter shares in the hallway his deep faith that he was healed following the diagnosis in 2016). He is willing to pursue EGD for more information but has made it clear to daughter and this NP at bedside, that if this is recurrent cancer--he does not want aggressive oncology treatment.   Troy Barrera is a strong advocate for her father. She has completed HCPOA and durable POA paperwork. HCPOA paperwork placed in chart.   Troy Barrera and I speak further in the hallway. She shares plans to move her father into ALF following this hospitalization. Prior to admit, he was still living in his large, 5 bedroom home that he cannot care for. Patient has told Troy Barrera to 'sell the house' and understands he needs ALF and more assistance.   Suit asks many 'what if' questions including if this is cancer. She asks questions about hospice services. Discussed hospice options and philosophy. Troy Barrera works for [employer] and has already requested [facility] outpatient palliative referral. Discussed watchful waiting with pending EGD.    **Troy Barrera requests I come back this afternoon to complete MOST form with her father. He has previously spoken of his desire for DNR.   This afternoon, follow-up  with patient and daughter at bedside. He remains irritable and just wants to 'get this (the paperwork) over with.'   Discussed MOST form and options including aggressive medical interventions vs. DNR/DNI and more conservative approach to care. Medically recommended DNR with his underlying frailty and fear that this will cause pain/suffering at the end of his life. Explained code blue scenario and poor outcomes of cpr with underlying conditions. Patient wishes include DNR/DNI, limited additional interventions including non-invasive forms of oxygen if necessary, ABX if life can be prolonged, IVF for a defined trial period, and NO feeding tube.  Durable DNR completed. Troy Barrera reviews and agrees/respects his decisions. Copies of MOST and durable DNR placed in chart and given to daughter.   Answered all questions and concerns. Hard Choices booklet and PMT contact information given to [facility]. Therapeutic listening and emotional/spiritual support provided.    SUMMARY OF RECOMMENDATIONS    MOST form completed with patient and daughter. Patient's wishes include DNR/DNI, limited additional interventions including non-invasive forms of oxygen if necessary, ABX if life can be prolonged, IVF for a defined trial period, and NO feeding tube. Durable DNR completed. MOST form and durable DNR copies given to daughter and placed in chart.   Appreciate GI recommendations. Plan is for EGD tomorrow.   If patient is found to have recurrent cancer, he likely will NOT pursue aggressive oncology interventions. He made this clear to daughter and PMT provider at bedside.   SW following for disposition plan. Possibly SNF for rehab vs. ALF.   HCPOA paperwork placed in chart. Daughter and son are documented HCPOA's.   Daughter requests outpatient palliative referral through [facility]. SW aware.   PMT provider will not return to [facility] until Monday, 10/12. Will f/u with patient/daughter if still hospitalized.   Code Status/Advance Care Planning:  DNR  Symptom Management:   Per attending  Palliative Prophylaxis:   Delirium Protocol, Frequent Pain Assessment, Oral Care and Turn Reposition  Additional Recommendations (Limitations, Scope, Preferences):  DNR/DNI, continue full scope treatment  Psycho-social/Spiritual:   Desire for further Chaplaincy support:yes  Additional Recommendations: Caregiving  Support/Resources and Education on Hospice  Prognosis:   Unable to determine  Discharge Planning: To Be Determined: SW following     Primary Diagnoses: Present on Admission: . Hypokalemia . Prolonged QT interval . GERD  (gastroesophageal reflux disease) . Anemia . Leukopenia . Hypercarbia . Hypophosphatemia . Hypernatremia . Gastric cancer s/p subtotal gastrectomy 01/28/15   I have reviewed the medical record, interviewed the patient and family, and examined the patient. The following aspects are pertinent.  Past Medical History:  Diagnosis Date  . Cancer [facility])    gastric cancer  . Diabetes mellitus without complication ([place])   . GERD (gastroesophageal reflux disease)   . Stomach cancer ([place]) 01/28/15   gastric adenocarcinoma   Social History   Socioeconomic History  . Marital status: Divorced    Spouse name: Not on file  . Number of children: Not on file  . Years of education: Not on file  . Highest education level: Not on file  Occupational History  . Not on file  Social Needs  . Financial resource strain: Not on file  . Food insecurity    Worry: Not on file    Inability: Not on file  . Transportation needs    Medical: Not on file    Non-medical: Not on file  Tobacco Use  . Smoking status: Former Smoker    Packs/day: 0.25    Years:  30.00    Pack years: 7.50    Types: Cigarettes    Quit date: 01/05/2015    Years since quitting: 4.1  . Smokeless tobacco: Never Used  Substance and Sexual Activity  . Alcohol use: Yes    Comment: beer weekends- none in last 4 months  . Drug use: No  . Sexual activity: Not on file  Lifestyle  . Physical activity    Days per week: Not on file    Minutes per session: Not on file  . Stress: Not on file  Relationships  . Social Herbalist on phone: Not on file    Gets together: Not on file    Attends religious service: Not on file    Active member of club or organization: Not on file    Attends meetings of clubs or organizations: Not on file    Relationship status: Not on file  Other Topics Concern  . Not on file  Social History Narrative   Divorced, lives alone in [place]   Works for cigarette company in [place]   Daughter,  Troy Barrera is primary support   Family History  Problem Relation Age of Onset  . Hypertension Mother   . Heart disease Father    Scheduled Meds: . Chlorhexidine Gluconate Cloth  6 each Topical Daily  . Chlorhexidine Gluconate Cloth  6 each Topical Daily  . feeding supplement (ENSURE ENLIVE)  237 mL Oral TID BM  . feeding supplement (PRO-STAT SUGAR FREE 64)  30 mL Oral BID  . Phospha 250 Neutral  500 mg Oral BID WC  . potassium chloride SA  40 mEq Oral BID  . tuberculin  5 Units Intradermal Once   Continuous Infusions:  PRN Meds:.acetaminophen **OR** acetaminophen Medications Prior to Admission:  Prior to Admission medications   Medication Sig Start Date End Date Taking? Authorizing Provider  ENSURE PLUS (ENSURE PLUS) LIQD Take 237 mLs by mouth 2 (two) times daily between meals.   Yes [provider]  metoCLOPramide (REGLAN) 10 MG tablet TAKE (1) TABLET BY MOUTH (3) TIMES DAILY BEFORE MEALS. Patient taking differently: Take 10 mg by mouth 3 (three) times daily before meals.  07/11/18  Yes Ladell Pier, MD  Multiple Vitamin (MULTIVITAMIN) tablet Take 1 tablet by mouth daily. megamend   Yes [provider]  pantoprazole (PROTONIX) 40 MG tablet TAKE 1 TABLET BY MOUTH DAILY. Patient taking differently: Take 40 mg by mouth daily.  08/05/18  Yes Ladell Pier, MD  potassium chloride SA (K-DUR,KLOR-CON) 20 MEQ tablet Take 1 tablet (20 mEq total) by mouth 2 (two) times daily. 07/11/18  Yes Ladell Pier, MD   No Known Allergies Review of Systems  Constitutional: Positive for activity change and unexpected weight change.  Gastrointestinal: Positive for abdominal distention.  Neurological: Positive for weakness.   Physical Exam Vitals signs and nursing note reviewed.  Constitutional:      General: He is awake.  Pulmonary:     Effort: No tachypnea, accessory muscle usage or respiratory distress.  Skin:    General: Skin is warm.  Neurological:     Mental  Status: He is alert and oriented to person, place, and time.  Psychiatric:     Comments: irritable    Vital Signs: BP 98/79 (BP Location: Right Arm)   Pulse 68   Temp 97.8 F (36.6 C) (Oral)   Resp 17   Ht '5\' 10"'  (1.778 m)   Wt 40.8 kg   SpO2  100%   BMI 12.91 kg/m  Pain Scale: 0-10   Pain Score: 0-No pain   SpO2: SpO2: 100 % O2 Device:SpO2: 100 % O2 Flow Rate: .   IO: Intake/output summary:   Intake/Output Summary (Last 24 hours) at 02/12/2019 1655 Last data filed at 02/12/2019 1300 Gross per 24 hour  Intake 1361.36 ml  Output 400 ml  Net 961.36 ml    LBM: Last BM Date: 02/09/19 Baseline Weight: Weight: 49.9 kg Most recent weight: Weight: 40.8 kg     Palliative Assessment/Data: PPS 50%   Flowsheet Rows     Most Recent Value  Intake Tab  Referral Department  Hospitalist  Unit at Time of Referral  Med/Surg Unit  Palliative Care Primary Diagnosis  Cancer  Date Notified  02/11/19  Palliative Care Type  New Palliative care  Reason for referral  Clarify Goals of Care  Date first seen by Palliative Care  02/12/19  # of days Palliative referral response time  1 Day(s)  Clinical Assessment  Palliative Performance Scale Score  50%  Psychosocial & Spiritual Assessment  Palliative Care Outcomes  Patient/Family meeting held?  Yes  Who was at the meeting?  patient, daughter  Palliative Care Outcomes  Clarified goals of care, Provided end of life care assistance, Provided advance care planning, Provided psychosocial or spiritual support, Changed CPR status, Completed durable DNR, Linked to palliative care logitudinal support, ACP counseling assistance      Time In/Out: 1000-1100, 6004-5997 Time Total: 4mn Greater than 50%  of this time was spent counseling and coordinating care related to the above assessment and plan.  Signed by:  MIhor Dow DNP, FNP-C Palliative Medicine Team  Phone: [phone]: [phone]  Please contact Palliative Medicine Team  phone at [phone] questions and concerns.  For individual provider: See AShea Evans

## 2019-02-13 ENCOUNTER — Encounter (HOSPITAL_COMMUNITY)
Admission: EM | Disposition: A | Discharge status: Nursing Home (Includes ICF) | Payer: Self-pay | Source: Home / Self Care | Attending: Internal Medicine

## 2019-02-13 ENCOUNTER — Encounter (HOSPITAL_COMMUNITY): Payer: Self-pay | Admitting: *Deleted

## 2019-02-13 DIAGNOSIS — R188 Other ascites: Secondary | ICD-10-CM

## 2019-02-13 DIAGNOSIS — Z980 Intestinal bypass and anastomosis status: Secondary | ICD-10-CM

## 2019-02-13 DIAGNOSIS — K2970 Gastritis, unspecified, without bleeding: Secondary | ICD-10-CM

## 2019-02-13 DIAGNOSIS — B3781 Candidal esophagitis: Secondary | ICD-10-CM

## 2019-02-13 DIAGNOSIS — R0689 Other abnormalities of breathing: Secondary | ICD-10-CM

## 2019-02-13 HISTORY — PX: BIOPSY: SHX5522

## 2019-02-13 HISTORY — PX: ESOPHAGOGASTRODUODENOSCOPY: SHX5428

## 2019-02-13 LAB — KOH PREP

## 2019-02-13 LAB — URINE CULTURE: Culture: <10000 — AB

## 2019-02-13 LAB — GLUCOSE, CAPILLARY
Glucose-Capillary: 71 mg/dL (ref 70–99)
Glucose-Capillary: 124 mg/dL — ABNORMAL HIGH (ref 70–99)

## 2019-02-13 SURGERY — EGD (ESOPHAGOGASTRODUODENOSCOPY)
Anesthesia: Moderate Sedation

## 2019-02-13 MED ORDER — MEPERIDINE HCL 100 MG/ML IJ SOLN
INTRAMUSCULAR | Status: DC | PRN
  Administered 2019-02-13: 25 mg

## 2019-02-13 MED ORDER — MEPERIDINE HCL 100 MG/ML IJ SOLN
INTRAMUSCULAR | Status: AC
  Filled 2019-02-13: qty 2

## 2019-02-13 MED ORDER — ENSURE ENLIVE PO LIQD: 237.0000 mL | Freq: Three times a day (TID) | ORAL | 12 refills | Status: AC

## 2019-02-13 MED ORDER — LIDOCAINE VISCOUS HCL 2 % MT SOLN
OROMUCOSAL | Status: DC | PRN
  Administered 2019-02-13: 1 via OROMUCOSAL

## 2019-02-13 MED ORDER — LIDOCAINE VISCOUS HCL 2 % MT SOLN
OROMUCOSAL | Status: AC
  Filled 2019-02-13: qty 15

## 2019-02-13 MED ORDER — PANTOPRAZOLE SODIUM 40 MG PO TBEC
40.0000 mg | DELAYED_RELEASE_TABLET | Freq: Every day | ORAL | Status: DC
  Administered 2019-02-14: 08:00:00 40 mg via ORAL
  Filled 2019-02-13: qty 1

## 2019-02-13 MED ORDER — MIDAZOLAM HCL 5 MG/5ML IJ SOLN
INTRAMUSCULAR | Status: DC | PRN
  Administered 2019-02-13 (×2): 1 mg via INTRAVENOUS

## 2019-02-13 MED ORDER — PRO-STAT SUGAR FREE PO LIQD: 30.0000 mL | Freq: Two times a day (BID) | ORAL | 0 refills | Status: AC

## 2019-02-13 MED ORDER — SODIUM CHLORIDE 0.9 % IV SOLN
INTRAVENOUS | Status: DC
  Administered 2019-02-13: 09:00:00 via INTRAVENOUS

## 2019-02-13 MED ORDER — MIDAZOLAM HCL 5 MG/5ML IJ SOLN
INTRAMUSCULAR | Status: AC
  Filled 2019-02-13: qty 10

## 2019-02-13 NOTE — Progress Notes (Addendum)
Patient to be discharged tomorrow to facility. Daughter unable to come; family is requesting for Troy Barrera, patient's son, to come until patient is discharged. Called front entrance to make an exception. Will inform night shift.  Also, patient's daughter Gelhar) would like to be notified when patient departs [facility].

## 2019-02-13 NOTE — Progress Notes (Signed)
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 The patient was beginning to feel dizzy when he stood up and this resulted in a fall backwards onto his couch. He did not hit his head. He did not lose consciousness. In the past 2 to 3 weeks, the patient has had decreased oral intake. He states that he has lost an additional 15 pounds. He lives by himself and does not have any one to assist him in his ADLs. Because of his progressive weakness and gait instability and failure to thrive, the patient was admitted for further evaluation.  During the hospitalization, reversible causes of his weight loss and failure to thrive were checked and were essentially unremarkable.  The patient was noted to be dehydrated.  He was fluid resuscitated.  His electrolyte abnormalities were optimized.  Palliative medicine was consulted to assist with management.  Repeat CT of the abdomen and pelvis showed thickened appearance of the epigastric small bowel anastomosis and thickened post anastomotic jejunum.  Because of concerns of recurrence of his gastric adenocarcinoma, GI was consulted, and the patient underwent EGD on 02/13/2019 which revealed healthy-appearing tissue at the anastomosis.  Biopsies were taken.  Pathology is pending at the time of discharge.  With assistance of social work, the patient will transition to assisted living.  Assessment/Plan: Failure to thrive/weight loss -Folic acid 8.6 -[id number] -CT chest/abd/pelvis--moderate bilateral pleural effusion, mild emphysema, unchanged liver cysts, atrophic pancreas, thickened appearance epigastric small bowel anastomosis and thickened post anastomotic  jejunum.  Diffuse anasarca -TSH--4.483 -mag--1.9 -PT eval-->SNF -continue ensure -check UA--no pyuria  Dehydration/hypernatremia -initially started on hypotonic fluid with improvement -Continue IV fluids as the patient's oral intake remains extremely poor -am BMP  Hypokalemia -Repleted  Hypophosphatemia -Repleted -Recheck phosphorus  Diabetes mellitus type 2 -02/09/2019 hemoglobin A1c--5.2 -Allow for liberal glycemic control at this point  Gastric adenocarcinomastage IIIA -12/4/18CT chest/abdomen/pelvis--no recurrence -02/11/19 CT chest/abd/pelvis--as above--discussed with Dr. Benay Spice -GI consulted-->plan EGD 10/9 -10/9--insufficient ascites for paracentesis -follows Dr. Benay Spice -Status post Bilroth II gastrojejunostomy -10/9 EGD--nonsevere esophagitis with bleeding; patent Bilroth II gastrojejunostomy with healthy appearing mucosa--biopsied; mild gastritis  Severe Malnutrition -continue ensure tid  Goals of care -consult palliative medicine per daughter request     Disposition Plan:ALF on 02/1019 Family Communication:Daughter updated at bedside 10/9  Consultants:none  Code Status: FULL   DVT Prophylaxis: [place] Lovenox   Procedures: As Listed in Progress Note Above  Antibiotics: None     Subjective: Pt states he is not hungry.  He denies f/c, cp, sob, n/v/d or abd pain.  Objective: Vitals:   02/13/19 1055 02/13/19 1100 02/13/19 1105 02/13/19 1317  BP: (!) 104/58 118/70  95/79  Pulse: 69 64 66 67  Resp: (!) 23 20 (!) 22 16  Temp:    98.4 F (36.9 C)  TempSrc:      SpO2: 100% 100% 97%   Weight:      Height:        Intake/Output Summary (Last 24 hours) at 02/13/2019 1542 Last data filed at 02/13/2019 1300 Gross per 24 hour  Intake 450 ml  Output 800 ml  Net -350 ml   Weight change:  Exam:   General:  Pt is alert,  follows commands appropriately, not in acute distress  HEENT: No icterus, No thrush, No neck  mass, [place]/AT  Cardiovascular: RRR, S1/S2, no rubs, no gallops  Respiratory: bibasilar crackles, no wheeze  Abdomen: Soft/+BS, non tender, non distended, no guarding  Extremities: No edema, No lymphangitis, No petechiae, No rashes, no synovitis   Data Reviewed: I have personally reviewed following labs and imaging studies Basic Metabolic Panel: Recent Labs  Lab 02/09/19 1845 02/10/19 0422 02/10/19 1355 02/11/19 0427 02/11/19 1352 02/12/19 0724  NA 146* 143 142 141  --  141  K 2.1* 2.9* 3.3* 3.4*  --  4.5  CL 98 99 97* 97*  --  103  CO2 39* 36* 36* 35*  --  32  GLUCOSE 109* 76 161* 105*  --  71  BUN 16 16 17 17   --  19  CREATININE 0.78 0.77 0.77 0.65  --  0.64  CALCIUM 6.9* 6.9* 6.9* 6.9*  --  7.0*  MG 2.0  --   --  1.9  --  1.8  PHOS 1.5*  --  2.6  --  2.3* 2.1*   Liver Function Tests: Recent Labs  Lab 02/09/19 1845 02/10/19 0422 02/12/19 0748  AST 29 27 31   ALT 18 17 21   ALKPHOS 110 101 113  BILITOT 0.4 0.6 0.4  PROT 4.7* 4.5* 4.4*  ALBUMIN 1.8* 1.6* 1.5*   No results for input(s): LIPASE, AMYLASE in the last 168 hours. No results for input(s): AMMONIA in the last 168 hours. Coagulation Profile: No results for input(s): INR, PROTIME in the last 168 hours. CBC: Recent Labs  Lab 02/09/19 1845 02/10/19 0422 02/12/19 0724  WBC 3.5* 9.1 4.4  NEUTROABS 2.8  --   --   HGB 9.7* 8.8* 8.5*  HCT 29.9* 26.6* 26.3*  MCV 87.2 86.4 87.1  PLT 196 169 196   Cardiac Enzymes: No results for input(s): CKTOTAL, CKMB, CKMBINDEX, TROPONINI in the last 168 hours. BNP: Invalid input(s): POCBNP CBG: Recent Labs  Lab 02/12/19 0731 02/12/19 1116 02/12/19 1626 02/12/19 2113 02/13/19 0727  GLUCAP 65* 102* 75 86 71   HbA1C: No results for input(s): HGBA1C in the last 72 hours. Urine analysis:    Component Value Date/Time   COLORURINE YELLOW 02/11/2019 2230   APPEARANCEUR CLEAR 02/11/2019 2230   LABSPEC 1.032 (H) 02/11/2019 2230   PHURINE 7.0 02/11/2019 2230    GLUCOSEU NEGATIVE 02/11/2019 2230   HGBUR NEGATIVE 02/11/2019 2230   BILIRUBINUR NEGATIVE 02/11/2019 2230   KETONESUR NEGATIVE 02/11/2019 2230   PROTEINUR 30 (A) 02/11/2019 2230   NITRITE NEGATIVE 02/11/2019 2230   LEUKOCYTESUR NEGATIVE 02/11/2019 2230   Sepsis Labs: @LABRCNTIP (procalcitonin:4,lacticidven:4) ) Recent Results (from the past 240 hour(s))  SARS Coronavirus 2 [facility] order, Performed in [facility] hospital lab) Nasopharyngeal Nasopharyngeal Swab     Status: None   Collection Time: 02/09/19  8:10 PM   Specimen: Nasopharyngeal Swab  Result Value Ref Range Status   SARS Coronavirus 2 NEGATIVE NEGATIVE Final    Comment: (NOTE) If result is NEGATIVE SARS-CoV-2 target nucleic acids are NOT DETECTED. The SARS-CoV-2 RNA is generally detectable in upper and lower  respiratory specimens during the acute phase of infection. The lowest  concentration of SARS-CoV-2 viral copies this assay can detect is 250  copies / mL. A negative result does not preclude SARS-CoV-2 infection  and should not be used as the sole basis for treatment or other  patient management decisions.  A negative result may occur with  improper specimen  collection / handling, submission of specimen other  than nasopharyngeal swab, presence of viral mutation(s) within the  areas targeted by this assay, and inadequate number of viral copies  (<250 copies / mL). A negative result must be combined with clinical  observations, patient history, and epidemiological information. If result is POSITIVE SARS-CoV-2 target nucleic acids are DETECTED. The SARS-CoV-2 RNA is generally detectable in upper and lower  respiratory specimens dur ing the acute phase of infection.  Positive  results are indicative of active infection with SARS-CoV-2.  Clinical  correlation with patient history and other diagnostic information is  necessary to determine patient infection status.  Positive results do  not rule out bacterial  infection or co-infection with other viruses. If result is PRESUMPTIVE POSTIVE SARS-CoV-2 nucleic acids MAY BE PRESENT.   A presumptive positive result was obtained on the submitted specimen  and confirmed on repeat testing.  While 2019 novel coronavirus  (SARS-CoV-2) nucleic acids may be present in the submitted sample  additional confirmatory testing may be necessary for epidemiological  and / or clinical management purposes  to differentiate between  SARS-CoV-2 and other Sarbecovirus currently known to infect humans.  If clinically indicated additional testing with an alternate test  methodology [phone]) is advised. The SARS-CoV-2 RNA is generally  detectable in upper and lower respiratory sp ecimens during the acute  phase of infection. The expected result is Negative. Fact Sheet for Patients:  StrictlyIdeas.no Fact Sheet for Healthcare Providers: BankingDealers.co.za This test is not yet approved or cleared by the [place] FDA and has been authorized for detection and/or diagnosis of SARS-CoV-2 by FDA under an Emergency Use Authorization (EUA).  This EUA will remain in effect (meaning this test can be used) for the duration of the COVID-19 declaration under Section 564(b)(1) of the Act, 21 U.S.C. section 360bbb-3(b)(1), unless the authorization is terminated or revoked sooner. Performed at [facility], [address] [place], [place] [address]   MRSA PCR Screening     Status: None   Collection Time: 02/09/19 10:09 PM   Specimen: Nasal Mucosa; Nasopharyngeal  Result Value Ref Range Status   MRSA by PCR NEGATIVE NEGATIVE Final    Comment:        The GeneXpert MRSA Assay (FDA approved for NASAL specimens only), is one component of a comprehensive MRSA colonization surveillance program. It is not intended to diagnose MRSA infection nor to guide or monitor treatment for MRSA infections. Performed at [facility],  [address], [place], [place]  [address]   Culture, Urine     Status: Abnormal   Collection Time: 02/11/19 10:30 PM   Specimen: Urine, Random  Result Value Ref Range Status   Specimen Description   Final    URINE, RANDOM Performed at [facility], [address] [place], [place] [address]    Special Requests   Final    NONE Performed at [facility], [address] [place], [place] [address]    Culture (A)  Final    <10,000 COLONIES/mL INSIGNIFICANT GROWTH Performed at [place] [address], [place], [place] [address]    Report Status 02/13/2019 FINAL  Final  KOH prep     Status: None   Collection Time: 02/13/19 10:59 AM   Specimen: Esophagus; GI  Result Value Ref Range Status   Specimen Description ESOPHAGUS  Final   Special Requests NONE  Final   KOH Prep   Final    MODERATE YEAST HYPHAL ELEMENTS SEEN Performed at [facility], [address], [place],  [address]    Report Status 02/13/2019 FINAL  Final     Scheduled Meds:  Chlorhexidine Gluconate Cloth  6 each Topical Daily   Chlorhexidine Gluconate Cloth  6 each Topical Daily   feeding supplement (ENSURE ENLIVE)  237 mL Oral TID BM   feeding supplement (PRO-STAT SUGAR FREE 64)  30 mL Oral BID   lidocaine       meperidine       midazolam       [START ON 02/14/2019] pantoprazole  40 mg Oral QAC breakfast   Phospha 250 Neutral  500 mg Oral BID WC   potassium chloride SA  40 mEq Oral BID   tuberculin  5 Units Intradermal Once   Continuous Infusions:  Procedures/Studies: Ct Chest W Contrast  Result Date: 02/11/2019 CLINICAL DATA:  Unintended weight loss abdominal pain history of gastric cancer EXAM: CT CHEST, ABDOMEN, AND PELVIS WITH CONTRAST TECHNIQUE: Multidetector CT imaging of the chest, abdomen and pelvis was performed following the standard protocol during bolus administration of intravenous contrast. CONTRAST:  47mL OMNIPAQUE IOHEXOL 300 MG/ML SOLN, 41mL OMNIPAQUE IOHEXOL 300 MG/ML  SOLN COMPARISON:  Chest x-ray 02/09/2019, CT 04/08/2017, 11/30/2016 FINDINGS: CT CHEST FINDINGS Cardiovascular: Nonaneurysmal aorta. Mild aortic atherosclerosis. No dissection is seen. Coronary calcifications. Normal heart size. Small pericardial effusion at the cardiac base. Mediastinum/Nodes: No thyroid mass. Midline trachea. No significant mediastinal adenopathy. Esophagus within normal limits. Lungs/Pleura: Moderate bilateral pleural effusions. Mild emphysematous disease. Passive atelectasis at the lower lobes. Musculoskeletal: Sternum is intact. Vertebral bodies demonstrate normal stature. No acute or suspicious osseous abnormality. CT ABDOMEN PELVIS FINDINGS Hepatobiliary: Circumscribed low-density lesions in the liver consistent with cysts. No change. No calcified gallstone or biliary dilatation Pancreas: Atrophic.  No ductal dilatation. Spleen: Normal in size without focal abnormality. Adrenals/Urinary Tract: Stable left adrenal gland nodularity. Negative right adrenal gland. Kidneys show no hydronephrosis. Subcentimeter hypodensities in the mid right kidney too small to further characterize. Cyst in the lower pole of the right kidney. Urinary bladder unremarkable. Stomach/Bowel: Status post partial gastrectomy with gastrojejunostomy. Thickened appearance at the gastric small bowel anastomosis and thickened appearance of post anastomotic jejunum. Dilated segment of fluid-filled small bowel up to 3.4 cm in the left lower quadrant but no convincing obstruction distal to this. Negative for colon wall thickening. Negative appendix. Vascular/Lymphatic: Moderate aortic atherosclerosis. Increased mural thrombus right side of the distal descending thoracic aorta and the proximal abdominal aorta. No aneurysm. SMA grossly patent. No grossly enlarged lymph nodes Reproductive: Prostate is unremarkable. Other: No free air. Large volume of ascites throughout the abdomen and pelvis. Diffuse edema within the subcutaneous  soft tissues of the body wall. Musculoskeletal: No acute or suspicious osseous abnormality. IMPRESSION: 1. Difficult study secondary to ataxia and generalized edema and ascites. Status post partial gastrectomy with gastrojejunostomy. Thickened appearance at the gastrojejunal anastomosis and thickened appearance of the post anastomotic small bowel, somewhat more prominent compared to prior. Findings could be secondary to infection, inflammation, or recurrent disease. Consider correlation with direct visualization. 2. Dilated loop of small bowel in the left lower quadrant though no convincing evidence for a bowel obstruction 3. Large volume of ascites throughout the abdomen and pelvis with diffuse anasarca. Moderate bilateral pleural effusions. Electronically Signed   By: Donavan Foil M.D.   On: 02/11/2019 23:57   Ct Abdomen Pelvis W Contrast  Result Date: 02/11/2019 CLINICAL DATA:  Unintended weight loss abdominal pain history of gastric cancer EXAM: CT CHEST, ABDOMEN, AND  PELVIS WITH CONTRAST TECHNIQUE: Multidetector CT imaging of the chest, abdomen and pelvis was performed following the standard protocol during bolus administration of intravenous contrast. CONTRAST:  53mL OMNIPAQUE IOHEXOL 300 MG/ML SOLN, 66mL OMNIPAQUE IOHEXOL 300 MG/ML SOLN COMPARISON:  Chest x-ray 02/09/2019, CT 04/08/2017, 11/30/2016 FINDINGS: CT CHEST FINDINGS Cardiovascular: Nonaneurysmal aorta. Mild aortic atherosclerosis. No dissection is seen. Coronary calcifications. Normal heart size. Small pericardial effusion at the cardiac base. Mediastinum/Nodes: No thyroid mass. Midline trachea. No significant mediastinal adenopathy. Esophagus within normal limits. Lungs/Pleura: Moderate bilateral pleural effusions. Mild emphysematous disease. Passive atelectasis at the lower lobes. Musculoskeletal: Sternum is intact. Vertebral bodies demonstrate normal stature. No acute or suspicious osseous abnormality. CT ABDOMEN PELVIS FINDINGS  Hepatobiliary: Circumscribed low-density lesions in the liver consistent with cysts. No change. No calcified gallstone or biliary dilatation Pancreas: Atrophic. No ductal dilatation. Spleen: Normal in size without focal abnormality. Adrenals/Urinary Tract: Stable left adrenal gland nodularity. Negative right adrenal gland. Kidneys show no hydronephrosis. Subcentimeter hypodensities in the mid right kidney too small to further characterize. Cyst in the lower pole of the right kidney. Urinary bladder unremarkable. Stomach/Bowel: Status post partial gastrectomy with gastrojejunostomy. Thickened appearance at the gastric small bowel anastomosis and thickened appearance of post anastomotic jejunum. Dilated segment of fluid-filled small bowel up to 3.4 cm in the left lower quadrant but no convincing obstruction distal to this. Negative for colon wall thickening. Negative appendix. Vascular/Lymphatic: Moderate aortic atherosclerosis. Increased mural thrombus right side of the distal descending thoracic aorta and the proximal abdominal aorta. No aneurysm. SMA grossly patent. No grossly enlarged lymph nodes Reproductive: Prostate is unremarkable. Other: No free air. Large volume of ascites throughout the abdomen and pelvis. Diffuse edema within the subcutaneous soft tissues of the body wall. Musculoskeletal: No acute or suspicious osseous abnormality. IMPRESSION: 1. Difficult study secondary to ataxia and generalized edema and ascites. Status post partial gastrectomy with gastrojejunostomy. Thickened appearance at the gastrojejunal anastomosis and thickened appearance of the post anastomotic small bowel, somewhat more prominent compared to prior. Findings could be secondary to infection, inflammation, or recurrent disease. Consider correlation with direct visualization. 2. Dilated loop of small bowel in the left lower quadrant though no convincing evidence for a bowel obstruction 3. Large volume of ascites throughout the  abdomen and pelvis with diffuse anasarca. Moderate bilateral pleural effusions. Electronically Signed   By: Donavan Foil M.D.   On: 02/11/2019 23:58   Dg Chest Portable 1 View  Result Date: 02/09/2019 CLINICAL DATA:  Witnessed syncopal episode today. EXAM: PORTABLE CHEST 1 VIEW COMPARISON:  None. FINDINGS: Heart size and mediastinal contours are within normal limits. Vague opacity at the LEFT lung base, suspected small layering pleural effusion. Lungs otherwise clear. No pneumothorax seen. Osseous structures about the chest are unremarkable. IMPRESSION: 1. Vague opacity at the LEFT lung base, suspected small layering pleural effusion. 2. Lungs otherwise clear. No evidence of pneumonia or pulmonary edema. Electronically Signed   By: Franki Cabot M.D.   On: 02/09/2019 19:00   [place] Ascites (abdomen Limited)  Result Date: 02/12/2019 CLINICAL DATA:  Gastric cancer, ascites EXAM: LIMITED ABDOMEN ULTRASOUND FOR ASCITES TECHNIQUE: Limited ultrasound survey for ascites was performed in all four abdominal quadrants. COMPARISON:  CT abdomen and pelvis 02/11/2019 FINDINGS: Small amount of ascites seen in the lower quadrants bilaterally. Small amount of perihepatic free fluid is also seen. Small RIGHT pleural effusion. Insufficient pocket of ascites for paracentesis. Majority of the ascites seen on the prior CT exam was deep in the pelvis, not accessible.  IMPRESSION: Insufficient ascites for paracentesis. Electronically Signed   By: Lavonia Dana M.D.   On: 02/12/2019 12:23    Orson Eva, DO  [facility] Pager [phone]  If 7PM-7AM, please contact night-coverage [email or web address] Password [email or web address] 02/13/2019, 3:42 PM   LOS: 3 days

## 2019-02-13 NOTE — TOC Progression Note (Signed)
Transition of Care [facility]) - Progression Note    Patient Details  Name: Troy Barrera MRN: [id number] Date of Birth: January 13, 1956  Transition of Care [facility]) CM/SW Contact  Ihor Gully, LCSW Phone Number: 02/13/2019, 2:03 PM  Clinical Narrative:    Edrick Kins and patient have chosen [facility] as discharge placement. Admission documentation was completed by patient and scanned to facility. Claudette Head at facility is agreeable to admission on 02/14/2019.     Expected Discharge Plan: [place] Barriers to Discharge: Continued Medical Work up  Expected Discharge Plan and Services Expected Discharge Plan: [place] arrangements for the past 2 months: Single Family Home                                       Social Determinants of Health (SDOH) Interventions    Readmission Risk Interventions No flowsheet data found.

## 2019-02-13 NOTE — Discharge Summary (Signed)
Physician Discharge Summary  Troy Barrera [id number] DOB: 04/22/1956 DOA: 02/09/2019  PCP: [name], MD  Admit date: 02/09/2019 Discharge date: 02/14/2019  Admitted From: Home Disposition:  SNF  Recommendations for Outpatient Follow-up:  1. Follow up with PCP in 1-2 weeks 2. Please obtain BMP/CBC in one week   Home Health: HHPT/OT Equipment/Devices: rolling walker  Discharge Condition: Stable CODE STATUS: DNR Diet recommendation: regular   Brief/Interim Summary: 63 year old male with a history of gastric adenocarcinomadiagnosed sept 2016 s/p gastrojejunostomyand diabetes mellitus type 2 presenting with 2 to 3-week history of progressive weakness worsening in the 2 days prior to admission. The patient was beginning to feel dizzy when he stood up and this resulted in a fall backwards onto his couch. He did not hit his head. He did not lose consciousness. In the past 2 to 3 weeks, the patient has had decreased oral intake. He states that he has lost an additional 15 pounds. He lives by himself and does not have any one to assist him in his ADLs. Because of his progressive weakness and gait instability and failure to thrive, the patient was admitted for further evaluation.  During the hospitalization, reversible causes of his weight loss and failure to thrive were checked and were essentially unremarkable.  The patient was noted to be dehydrated.  He was fluid resuscitated.  His electrolyte abnormalities were optimized.  Palliative medicine was consulted to assist with management.  Repeat CT of the abdomen and pelvis showed thickened appearance of the epigastric small bowel anastomosis and thickened post anastomotic jejunum.  Because of concerns of recurrence of his gastric adenocarcinoma, GI was consulted, and the patient underwent EGD on 02/13/2019 which revealed healthy-appearing tissue at the anastomosis.  Biopsies were taken.  Pathology is pending at the time of  discharge.  With assistance of social work, the patient will transition to assisted living.   Discharge Diagnoses:  Failure to thrive/weight loss -Folic acid 8.6 -[id number] -CT chest/abd/pelvis--moderate bilateral pleural effusion, mild emphysema, unchanged liver cysts, atrophic pancreas, thickened appearance epigastric small bowel anastomosis and thickened post anastomotic jejunum. Diffuse anasarca -TSH--4.483 -mag--1.9 -PT eval-->SNF -continue ensure -check UA--no pyuria  Dehydration/hypernatremia -initially started on hypotonic fluid with improvement -Continue IV fluids as the patient's oral intake remains extremely poor -am BMP  Hypokalemia -Repleted  Hypophosphatemia -Repleted  Diabetes mellitus type 2 -02/09/2019 hemoglobin A1c--5.2 -Allow for liberal glycemic control at this point  Gastric adenocarcinomastage IIIA -12/4/18CT chest/abdomen/pelvis--no recurrence -02/11/19 CT chest/abd/pelvis--as above--discussed with Dr. Benay Spice -GI consulted-->plan EGD 10/9 -10/9--insufficient ascites for paracentesis -follows Dr. Benay Spice -Status post Bilroth II gastrojejunostomy -10/9 EGD--nonsevere esophagitis with bleeding; patent Bilroth II gastrojejunostomy with healthy appearing mucosa--biopsied; mild gastritis -GI will follow up with biopsy pathology and update patient and family  Severe Malnutrition -continue ensure tid  Goals of care -consulted palliative medicine per daughter request-->DNR     Discharge Instructions   Allergies as of 02/14/2019   No Known Allergies     Medication List    STOP taking these medications   potassium chloride SA 20 MEQ tablet Commonly known as: KLOR-CON     TAKE these medications   Ensure Plus Liqd Take 237 mLs by mouth 2 (two) times daily between meals. What changed: Another medication with the same name was added. Make sure you understand how and when to take each.   feeding supplement (ENSURE ENLIVE)  Liqd Take 237 mLs by mouth 3 (three) times daily between meals. What changed: You were already taking a medication with  the same name, and this prescription was added. Make sure you understand how and when to take each.   feeding supplement (PRO-STAT SUGAR FREE 64) Liqd Take 30 mLs by mouth 2 (two) times daily.   metoCLOPramide 10 MG tablet Commonly known as: REGLAN TAKE (1) TABLET BY MOUTH (3) TIMES DAILY BEFORE MEALS. What changed:   how much to take  how to take this  when to take this  additional instructions   multivitamin tablet Take 1 tablet by mouth daily. megamend   pantoprazole 40 MG tablet Commonly known as: PROTONIX TAKE 1 TABLET BY MOUTH DAILY.            Durable Medical Equipment  (From admission, onward)         Start     Ordered   02/13/19 1519  For home use only DME Walker rolling  Once    Question:  Patient needs a walker to treat with the following condition  Answer:  Gait instability   02/13/19 1518         Contact information for after-discharge care    [facility] .   Service: Assisted Living Contact information: [name] [phone]             No Known Allergies  Consultations:  Palliative medicine  GI   Procedures/Studies: Ct Chest W Contrast  Result Date: 02/11/2019 CLINICAL DATA:  Unintended weight loss abdominal pain history of gastric cancer EXAM: CT CHEST, ABDOMEN, AND PELVIS WITH CONTRAST TECHNIQUE: Multidetector CT imaging of the chest, abdomen and pelvis was performed following the standard protocol during bolus administration of intravenous contrast. CONTRAST:  31mL OMNIPAQUE IOHEXOL 300 MG/ML SOLN, 22mL OMNIPAQUE IOHEXOL 300 MG/ML SOLN COMPARISON:  Chest x-ray 02/09/2019, CT 04/08/2017, 11/30/2016 FINDINGS: CT CHEST FINDINGS Cardiovascular: Nonaneurysmal aorta. Mild aortic atherosclerosis. No dissection is seen. Coronary  calcifications. Normal heart size. Small pericardial effusion at the cardiac base. Mediastinum/Nodes: No thyroid mass. Midline trachea. No significant mediastinal adenopathy. Esophagus within normal limits. Lungs/Pleura: Moderate bilateral pleural effusions. Mild emphysematous disease. Passive atelectasis at the lower lobes. Musculoskeletal: Sternum is intact. Vertebral bodies demonstrate normal stature. No acute or suspicious osseous abnormality. CT ABDOMEN PELVIS FINDINGS Hepatobiliary: Circumscribed low-density lesions in the liver consistent with cysts. No change. No calcified gallstone or biliary dilatation Pancreas: Atrophic.  No ductal dilatation. Spleen: Normal in size without focal abnormality. Adrenals/Urinary Tract: Stable left adrenal gland nodularity. Negative right adrenal gland. Kidneys show no hydronephrosis. Subcentimeter hypodensities in the mid right kidney too small to further characterize. Cyst in the lower pole of the right kidney. Urinary bladder unremarkable. Stomach/Bowel: Status post partial gastrectomy with gastrojejunostomy. Thickened appearance at the gastric small bowel anastomosis and thickened appearance of post anastomotic jejunum. Dilated segment of fluid-filled small bowel up to 3.4 cm in the left lower quadrant but no convincing obstruction distal to this. Negative for colon wall thickening. Negative appendix. Vascular/Lymphatic: Moderate aortic atherosclerosis. Increased mural thrombus right side of the distal descending thoracic aorta and the proximal abdominal aorta. No aneurysm. SMA grossly patent. No grossly enlarged lymph nodes Reproductive: Prostate is unremarkable. Other: No free air. Large volume of ascites throughout the abdomen and pelvis. Diffuse edema within the subcutaneous soft tissues of the body wall. Musculoskeletal: No acute or suspicious osseous abnormality. IMPRESSION: 1. Difficult study secondary to ataxia and generalized edema and ascites. Status post  partial gastrectomy with gastrojejunostomy. Thickened appearance at the gastrojejunal anastomosis and  thickened appearance of the post anastomotic small bowel, somewhat more prominent compared to prior. Findings could be secondary to infection, inflammation, or recurrent disease. Consider correlation with direct visualization. 2. Dilated loop of small bowel in the left lower quadrant though no convincing evidence for a bowel obstruction 3. Large volume of ascites throughout the abdomen and pelvis with diffuse anasarca. Moderate bilateral pleural effusions. Electronically Signed   By: Donavan Foil M.D.   On: 02/11/2019 23:57   Ct Abdomen Pelvis W Contrast  Result Date: 02/11/2019 CLINICAL DATA:  Unintended weight loss abdominal pain history of gastric cancer EXAM: CT CHEST, ABDOMEN, AND PELVIS WITH CONTRAST TECHNIQUE: Multidetector CT imaging of the chest, abdomen and pelvis was performed following the standard protocol during bolus administration of intravenous contrast. CONTRAST:  80mL OMNIPAQUE IOHEXOL 300 MG/ML SOLN, 78mL OMNIPAQUE IOHEXOL 300 MG/ML SOLN COMPARISON:  Chest x-ray 02/09/2019, CT 04/08/2017, 11/30/2016 FINDINGS: CT CHEST FINDINGS Cardiovascular: Nonaneurysmal aorta. Mild aortic atherosclerosis. No dissection is seen. Coronary calcifications. Normal heart size. Small pericardial effusion at the cardiac base. Mediastinum/Nodes: No thyroid mass. Midline trachea. No significant mediastinal adenopathy. Esophagus within normal limits. Lungs/Pleura: Moderate bilateral pleural effusions. Mild emphysematous disease. Passive atelectasis at the lower lobes. Musculoskeletal: Sternum is intact. Vertebral bodies demonstrate normal stature. No acute or suspicious osseous abnormality. CT ABDOMEN PELVIS FINDINGS Hepatobiliary: Circumscribed low-density lesions in the liver consistent with cysts. No change. No calcified gallstone or biliary dilatation Pancreas: Atrophic. No ductal dilatation. Spleen: Normal in  size without focal abnormality. Adrenals/Urinary Tract: Stable left adrenal gland nodularity. Negative right adrenal gland. Kidneys show no hydronephrosis. Subcentimeter hypodensities in the mid right kidney too small to further characterize. Cyst in the lower pole of the right kidney. Urinary bladder unremarkable. Stomach/Bowel: Status post partial gastrectomy with gastrojejunostomy. Thickened appearance at the gastric small bowel anastomosis and thickened appearance of post anastomotic jejunum. Dilated segment of fluid-filled small bowel up to 3.4 cm in the left lower quadrant but no convincing obstruction distal to this. Negative for colon wall thickening. Negative appendix. Vascular/Lymphatic: Moderate aortic atherosclerosis. Increased mural thrombus right side of the distal descending thoracic aorta and the proximal abdominal aorta. No aneurysm. SMA grossly patent. No grossly enlarged lymph nodes Reproductive: Prostate is unremarkable. Other: No free air. Large volume of ascites throughout the abdomen and pelvis. Diffuse edema within the subcutaneous soft tissues of the body wall. Musculoskeletal: No acute or suspicious osseous abnormality. IMPRESSION: 1. Difficult study secondary to ataxia and generalized edema and ascites. Status post partial gastrectomy with gastrojejunostomy. Thickened appearance at the gastrojejunal anastomosis and thickened appearance of the post anastomotic small bowel, somewhat more prominent compared to prior. Findings could be secondary to infection, inflammation, or recurrent disease. Consider correlation with direct visualization. 2. Dilated loop of small bowel in the left lower quadrant though no convincing evidence for a bowel obstruction 3. Large volume of ascites throughout the abdomen and pelvis with diffuse anasarca. Moderate bilateral pleural effusions. Electronically Signed   By: Donavan Foil M.D.   On: 02/11/2019 23:58   Dg Chest Portable 1 View  Result Date:  02/09/2019 CLINICAL DATA:  Witnessed syncopal episode today. EXAM: PORTABLE CHEST 1 VIEW COMPARISON:  None. FINDINGS: Heart size and mediastinal contours are within normal limits. Vague opacity at the LEFT lung base, suspected small layering pleural effusion. Lungs otherwise clear. No pneumothorax seen. Osseous structures about the chest are unremarkable. IMPRESSION: 1. Vague opacity at the LEFT lung base, suspected small layering pleural effusion. 2. Lungs otherwise clear. No evidence of  pneumonia or pulmonary edema. Electronically Signed   By: Franki Cabot M.D.   On: 02/09/2019 19:00   [place] Ascites (abdomen Limited)  Result Date: 02/12/2019 CLINICAL DATA:  Gastric cancer, ascites EXAM: LIMITED ABDOMEN ULTRASOUND FOR ASCITES TECHNIQUE: Limited ultrasound survey for ascites was performed in all four abdominal quadrants. COMPARISON:  CT abdomen and pelvis 02/11/2019 FINDINGS: Small amount of ascites seen in the lower quadrants bilaterally. Small amount of perihepatic free fluid is also seen. Small RIGHT pleural effusion. Insufficient pocket of ascites for paracentesis. Majority of the ascites seen on the prior CT exam was deep in the pelvis, not accessible. IMPRESSION: Insufficient ascites for paracentesis. Electronically Signed   By: Lavonia Dana M.D.   On: 02/12/2019 12:23        Discharge Exam: Vitals:   02/14/19 0641 02/14/19 1030  BP: 105/78   Pulse: 81   Resp: 16   Temp: 98.9 F (37.2 C)   SpO2: 100% 97%   Vitals:   02/13/19 1317 02/13/19 2157 02/14/19 0641 02/14/19 1030  BP: 95/79 92/61 105/78   Pulse: 67 69 81   Resp: 16  16   Temp: 98.4 F (36.9 C) 99.2 F (37.3 C) 98.9 F (37.2 C)   TempSrc:  Oral Oral   SpO2:  100% 100% 97%  Weight:      Height:        General: Pt is alert, awake, not in acute distress Cardiovascular: RRR, S1/S2 +, no rubs, no gallops Respiratory: bibasilar crackles, no wheeze Abdominal: Soft, NT, ND, bowel sounds + Extremities: 1+ LE edema, no  cyanosis   The results of significant diagnostics from this hospitalization (including imaging, microbiology, ancillary and laboratory) are listed below for reference.    Significant Diagnostic Studies: Ct Chest W Contrast  Result Date: 02/11/2019 CLINICAL DATA:  Unintended weight loss abdominal pain history of gastric cancer EXAM: CT CHEST, ABDOMEN, AND PELVIS WITH CONTRAST TECHNIQUE: Multidetector CT imaging of the chest, abdomen and pelvis was performed following the standard protocol during bolus administration of intravenous contrast. CONTRAST:  58mL OMNIPAQUE IOHEXOL 300 MG/ML SOLN, 52mL OMNIPAQUE IOHEXOL 300 MG/ML SOLN COMPARISON:  Chest x-ray 02/09/2019, CT 04/08/2017, 11/30/2016 FINDINGS: CT CHEST FINDINGS Cardiovascular: Nonaneurysmal aorta. Mild aortic atherosclerosis. No dissection is seen. Coronary calcifications. Normal heart size. Small pericardial effusion at the cardiac base. Mediastinum/Nodes: No thyroid mass. Midline trachea. No significant mediastinal adenopathy. Esophagus within normal limits. Lungs/Pleura: Moderate bilateral pleural effusions. Mild emphysematous disease. Passive atelectasis at the lower lobes. Musculoskeletal: Sternum is intact. Vertebral bodies demonstrate normal stature. No acute or suspicious osseous abnormality. CT ABDOMEN PELVIS FINDINGS Hepatobiliary: Circumscribed low-density lesions in the liver consistent with cysts. No change. No calcified gallstone or biliary dilatation Pancreas: Atrophic.  No ductal dilatation. Spleen: Normal in size without focal abnormality. Adrenals/Urinary Tract: Stable left adrenal gland nodularity. Negative right adrenal gland. Kidneys show no hydronephrosis. Subcentimeter hypodensities in the mid right kidney too small to further characterize. Cyst in the lower pole of the right kidney. Urinary bladder unremarkable. Stomach/Bowel: Status post partial gastrectomy with gastrojejunostomy. Thickened appearance at the gastric small bowel  anastomosis and thickened appearance of post anastomotic jejunum. Dilated segment of fluid-filled small bowel up to 3.4 cm in the left lower quadrant but no convincing obstruction distal to this. Negative for colon wall thickening. Negative appendix. Vascular/Lymphatic: Moderate aortic atherosclerosis. Increased mural thrombus right side of the distal descending thoracic aorta and the proximal abdominal aorta. No aneurysm. SMA grossly patent. No grossly enlarged lymph nodes Reproductive: Prostate  is unremarkable. Other: No free air. Large volume of ascites throughout the abdomen and pelvis. Diffuse edema within the subcutaneous soft tissues of the body wall. Musculoskeletal: No acute or suspicious osseous abnormality. IMPRESSION: 1. Difficult study secondary to ataxia and generalized edema and ascites. Status post partial gastrectomy with gastrojejunostomy. Thickened appearance at the gastrojejunal anastomosis and thickened appearance of the post anastomotic small bowel, somewhat more prominent compared to prior. Findings could be secondary to infection, inflammation, or recurrent disease. Consider correlation with direct visualization. 2. Dilated loop of small bowel in the left lower quadrant though no convincing evidence for a bowel obstruction 3. Large volume of ascites throughout the abdomen and pelvis with diffuse anasarca. Moderate bilateral pleural effusions. Electronically Signed   By: Donavan Foil M.D.   On: 02/11/2019 23:57   Ct Abdomen Pelvis W Contrast  Result Date: 02/11/2019 CLINICAL DATA:  Unintended weight loss abdominal pain history of gastric cancer EXAM: CT CHEST, ABDOMEN, AND PELVIS WITH CONTRAST TECHNIQUE: Multidetector CT imaging of the chest, abdomen and pelvis was performed following the standard protocol during bolus administration of intravenous contrast. CONTRAST:  68mL OMNIPAQUE IOHEXOL 300 MG/ML SOLN, 47mL OMNIPAQUE IOHEXOL 300 MG/ML SOLN COMPARISON:  Chest x-ray 02/09/2019, CT  04/08/2017, 11/30/2016 FINDINGS: CT CHEST FINDINGS Cardiovascular: Nonaneurysmal aorta. Mild aortic atherosclerosis. No dissection is seen. Coronary calcifications. Normal heart size. Small pericardial effusion at the cardiac base. Mediastinum/Nodes: No thyroid mass. Midline trachea. No significant mediastinal adenopathy. Esophagus within normal limits. Lungs/Pleura: Moderate bilateral pleural effusions. Mild emphysematous disease. Passive atelectasis at the lower lobes. Musculoskeletal: Sternum is intact. Vertebral bodies demonstrate normal stature. No acute or suspicious osseous abnormality. CT ABDOMEN PELVIS FINDINGS Hepatobiliary: Circumscribed low-density lesions in the liver consistent with cysts. No change. No calcified gallstone or biliary dilatation Pancreas: Atrophic. No ductal dilatation. Spleen: Normal in size without focal abnormality. Adrenals/Urinary Tract: Stable left adrenal gland nodularity. Negative right adrenal gland. Kidneys show no hydronephrosis. Subcentimeter hypodensities in the mid right kidney too small to further characterize. Cyst in the lower pole of the right kidney. Urinary bladder unremarkable. Stomach/Bowel: Status post partial gastrectomy with gastrojejunostomy. Thickened appearance at the gastric small bowel anastomosis and thickened appearance of post anastomotic jejunum. Dilated segment of fluid-filled small bowel up to 3.4 cm in the left lower quadrant but no convincing obstruction distal to this. Negative for colon wall thickening. Negative appendix. Vascular/Lymphatic: Moderate aortic atherosclerosis. Increased mural thrombus right side of the distal descending thoracic aorta and the proximal abdominal aorta. No aneurysm. SMA grossly patent. No grossly enlarged lymph nodes Reproductive: Prostate is unremarkable. Other: No free air. Large volume of ascites throughout the abdomen and pelvis. Diffuse edema within the subcutaneous soft tissues of the body wall. Musculoskeletal:  No acute or suspicious osseous abnormality. IMPRESSION: 1. Difficult study secondary to ataxia and generalized edema and ascites. Status post partial gastrectomy with gastrojejunostomy. Thickened appearance at the gastrojejunal anastomosis and thickened appearance of the post anastomotic small bowel, somewhat more prominent compared to prior. Findings could be secondary to infection, inflammation, or recurrent disease. Consider correlation with direct visualization. 2. Dilated loop of small bowel in the left lower quadrant though no convincing evidence for a bowel obstruction 3. Large volume of ascites throughout the abdomen and pelvis with diffuse anasarca. Moderate bilateral pleural effusions. Electronically Signed   By: Donavan Foil M.D.   On: 02/11/2019 23:58   Dg Chest Portable 1 View  Result Date: 02/09/2019 CLINICAL DATA:  Witnessed syncopal episode today. EXAM: PORTABLE CHEST 1 VIEW COMPARISON:  None. FINDINGS: Heart size and mediastinal contours are within normal limits. Vague opacity at the LEFT lung base, suspected small layering pleural effusion. Lungs otherwise clear. No pneumothorax seen. Osseous structures about the chest are unremarkable. IMPRESSION: 1. Vague opacity at the LEFT lung base, suspected small layering pleural effusion. 2. Lungs otherwise clear. No evidence of pneumonia or pulmonary edema. Electronically Signed   By: Franki Cabot M.D.   On: 02/09/2019 19:00   [place] Ascites (abdomen Limited)  Result Date: 02/12/2019 CLINICAL DATA:  Gastric cancer, ascites EXAM: LIMITED ABDOMEN ULTRASOUND FOR ASCITES TECHNIQUE: Limited ultrasound survey for ascites was performed in all four abdominal quadrants. COMPARISON:  CT abdomen and pelvis 02/11/2019 FINDINGS: Small amount of ascites seen in the lower quadrants bilaterally. Small amount of perihepatic free fluid is also seen. Small RIGHT pleural effusion. Insufficient pocket of ascites for paracentesis. Majority of the ascites seen on the prior  CT exam was deep in the pelvis, not accessible. IMPRESSION: Insufficient ascites for paracentesis. Electronically Signed   By: Lavonia Dana M.D.   On: 02/12/2019 12:23     Microbiology: Recent Results (from the past 240 hour(s))  SARS Coronavirus 2 [facility] order, Performed in [facility] hospital lab) Nasopharyngeal Nasopharyngeal Swab     Status: None   Collection Time: 02/09/19  8:10 PM   Specimen: Nasopharyngeal Swab  Result Value Ref Range Status   SARS Coronavirus 2 NEGATIVE NEGATIVE Final    Comment: (NOTE) If result is NEGATIVE SARS-CoV-2 target nucleic acids are NOT DETECTED. The SARS-CoV-2 RNA is generally detectable in upper and lower  respiratory specimens during the acute phase of infection. The lowest  concentration of SARS-CoV-2 viral copies this assay can detect is 250  copies / mL. A negative result does not preclude SARS-CoV-2 infection  and should not be used as the sole basis for treatment or other  patient management decisions.  A negative result may occur with  improper specimen collection / handling, submission of specimen other  than nasopharyngeal swab, presence of viral mutation(s) within the  areas targeted by this assay, and inadequate number of viral copies  (<250 copies / mL). A negative result must be combined with clinical  observations, patient history, and epidemiological information. If result is POSITIVE SARS-CoV-2 target nucleic acids are DETECTED. The SARS-CoV-2 RNA is generally detectable in upper and lower  respiratory specimens dur ing the acute phase of infection.  Positive  results are indicative of active infection with SARS-CoV-2.  Clinical  correlation with patient history and other diagnostic information is  necessary to determine patient infection status.  Positive results do  not rule out bacterial infection or co-infection with other viruses. If result is PRESUMPTIVE POSTIVE SARS-CoV-2 nucleic acids MAY BE PRESENT.   A presumptive  positive result was obtained on the submitted specimen  and confirmed on repeat testing.  While 2019 novel coronavirus  (SARS-CoV-2) nucleic acids may be present in the submitted sample  additional confirmatory testing may be necessary for epidemiological  and / or clinical management purposes  to differentiate between  SARS-CoV-2 and other Sarbecovirus currently known to infect humans.  If clinically indicated additional testing with an alternate test  methodology [phone]) is advised. The SARS-CoV-2 RNA is generally  detectable in upper and lower respiratory sp ecimens during the acute  phase of infection. The expected result is Negative. Fact Sheet for Patients:  StrictlyIdeas.no Fact Sheet for Healthcare Providers: BankingDealers.co.za This test is not yet approved or cleared by the [place] FDA and  has been authorized for detection and/or diagnosis of SARS-CoV-2 by FDA under an Emergency Use Authorization (EUA).  This EUA will remain in effect (meaning this test can be used) for the duration of the COVID-19 declaration under Section 564(b)(1) of the Act, 21 U.S.C. section 360bbb-3(b)(1), unless the authorization is terminated or revoked sooner. Performed at [facility], [address] [place], [place] [address]   MRSA PCR Screening     Status: None   Collection Time: 02/09/19 10:09 PM   Specimen: Nasal Mucosa; Nasopharyngeal  Result Value Ref Range Status   MRSA by PCR NEGATIVE NEGATIVE Final    Comment:        The GeneXpert MRSA Assay (FDA approved for NASAL specimens only), is one component of a comprehensive MRSA colonization surveillance program. It is not intended to diagnose MRSA infection nor to guide or monitor treatment for MRSA infections. Performed at [facility], [address] [place], [place] [address]   Culture, Urine     Status: Abnormal   Collection Time: 02/11/19 10:30 PM   Specimen: Urine, Random   Result Value Ref Range Status   Specimen Description   Final    URINE, RANDOM Performed at [facility], [address] [place], [place] [address]    Special Requests   Final    NONE Performed at [facility], [address] [place], [place] [address]    Culture (A)  Final    <10,000 COLONIES/mL INSIGNIFICANT GROWTH Performed at [facility] [address], [place], [place] [address]    Report Status 02/13/2019 FINAL  Final  KOH prep     Status: None   Collection Time: 02/13/19 10:59 AM   Specimen: Esophagus; GI  Result Value Ref Range Status   Specimen Description ESOPHAGUS  Final   Special Requests NONE  Final   KOH Prep   Final    MODERATE YEAST HYPHAL ELEMENTS SEEN Performed at [facility] [address] [place],  [address]    Report Status 02/13/2019 FINAL  Final     Labs: Basic Metabolic Panel: Recent Labs  Lab 02/09/19 1845 02/10/19 0422 02/10/19 1355 02/11/19 0427 02/11/19 1352 02/12/19 0724 02/14/19 0708  NA 146* 143 142 141  --  141 140  K 2.1* 2.9* 3.3* 3.4*  --  4.5 4.9  CL 98 99 97* 97*  --  103 109  CO2 39* 36* 36* 35*  --  32 27  GLUCOSE 109* 76 161* 105*  --  71 145*  BUN 16 16 17 17   --  19 23  CREATININE 0.78 0.77 0.77 0.65  --  0.64 0.60*  CALCIUM 6.9* 6.9* 6.9* 6.9*  --  7.0* 7.2*  MG 2.0  --   --  1.9  --  1.8 1.8  PHOS 1.5*  --  2.6  --  2.3* 2.1* 2.2*   Liver Function Tests: Recent Labs  Lab 02/09/19 1845 02/10/19 0422 02/12/19 0748  AST 29 27 31   ALT 18 17 21   ALKPHOS 110 101 113  BILITOT 0.4 0.6 0.4  PROT 4.7* 4.5* 4.4*  ALBUMIN 1.8* 1.6* 1.5*   No results for input(s): LIPASE, AMYLASE in the last 168 hours. No results for input(s): AMMONIA in the last 168 hours. CBC: Recent Labs  Lab 02/09/19 1845 02/10/19 0422 02/12/19 0724 02/14/19 0708  WBC 3.5* 9.1 4.4 5.3  NEUTROABS 2.8  --   --   --   HGB 9.7* 8.8* 8.5* 7.8*  HCT 29.9*  26.6* 26.3* 24.2*  MCV 87.2 86.4 87.1 87.4  PLT 196 169 196 212    Cardiac Enzymes: No results for input(s): CKTOTAL, CKMB, CKMBINDEX, TROPONINI in the last 168 hours. BNP: Invalid input(s): POCBNP CBG: Recent Labs  Lab 02/12/19 1626 02/12/19 2113 02/13/19 0727 02/13/19 2158 02/14/19 0734  GLUCAP 75 86 71 124* 131*    Time coordinating discharge:  36 minutes  Signed:  Orson Eva, DO [facility] Pager: [phone] 02/14/2019, 10:47 AM

## 2019-02-13 NOTE — H&P (Signed)
Primary Care Physician:  [name], MD Primary Gastroenterologist:  Dr. Oneida Alar  Pre-Procedure History & Physical: HPI:  Troy [place] is a 63 y.o. male here for Abnormal CT scan/WEIGHT LOSS, PMHx: GASTRIC CA WITH BILROTH II.  Past Medical History:  Diagnosis Date  . Cancer [facility])    gastric cancer  . Diabetes mellitus without complication ([place])   . GERD (gastroesophageal reflux disease)   . 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 ESOPHAGOGASTRODUODENOSCOPY  01/2017   Dr. Wilford Corner: Esophagus normal, patent Billroth II gastrojejunostomy, segmental mild mucosal changes with congestion and erythema found at the anastomosis (chronic inflammation, no malignancy or H. pylori)  . ESOPHAGOGASTRODUODENOSCOPY  12/2014   Dr. Britta Mccreedy: Ulcerated mass measuring 3 x 4 cm found on the greater curve of the stomach and in the gastric body, biopsies consistent with adenocarcinoma.  . NO PAST SURGERIES    . PARTIAL GASTRECTOMY N/A 01/28/2015   Procedure: PARTIAL GASTRECTOMY;  Surgeon: Jackolyn Confer, MD;  Location: [facility];  Service: General;  Laterality: N/A;    Prior to Admission medications   Medication Sig Start Date End Date Taking? Authorizing Provider  ENSURE PLUS (ENSURE PLUS) LIQD Take 237 mLs by mouth 2 (two) times daily between meals.   Yes [provider]  metoCLOPramide (REGLAN) 10 MG tablet TAKE (1) TABLET BY MOUTH (3) TIMES DAILY BEFORE MEALS. Patient taking differently: Take 10 mg by mouth 3 (three) times daily before meals.  07/11/18  Yes Ladell Pier, MD  Multiple Vitamin (MULTIVITAMIN) tablet Take 1 tablet by mouth daily. megamend   Yes [provider]  pantoprazole (PROTONIX) 40 MG tablet TAKE 1 TABLET BY MOUTH DAILY. Patient taking differently: Take 40 mg by mouth daily.  08/05/18  Yes Ladell Pier, MD  potassium chloride SA (K-DUR,KLOR-CON) 20 MEQ tablet Take 1 tablet (20  mEq total) by mouth 2 (two) times daily. 07/11/18  Yes Ladell Pier, MD    Allergies as of 02/09/2019  . (No Known Allergies)    Family History  Problem Relation Age of Onset  . Hypertension Mother   . Heart disease Father     Social History   Socioeconomic History  . Marital status: Divorced    Spouse name: Not on file  . Number of children: Not on file  . Years of education: Not on file  . Highest education level: Not on file  Occupational History  . Not on file  Social Needs  . Financial resource strain: Not on file  . Food insecurity    Worry: Not on file    Inability: Not on file  . Transportation needs    Medical: Not on file    Non-medical: Not on file  Tobacco Use  . Smoking status: Former Smoker    Packs/day: 0.25    Years: 30.00    Pack years: 7.50    Types: Cigarettes    Quit date: 01/05/2015    Years since quitting: 4.1  . Smokeless tobacco: Never Used  Substance and Sexual Activity  . Alcohol use: Yes    Comment: beer weekends- none in last 4 months  . Drug use: No  . Sexual activity: Not on file  Lifestyle  . Physical activity    Days per week: Not on file    Minutes per session: Not on file  . Stress: Not on file  Relationships  . Social connections  Talks on phone: Not on file    Gets together: Not on file    Attends religious service: Not on file    Active member of club or organization: Not on file    Attends meetings of clubs or organizations: Not on file    Relationship status: Not on file  . Intimate partner violence    Fear of current or ex partner: Not on file    Emotionally abused: Not on file    Physically abused: Not on file    Forced sexual activity: Not on file  Other Topics Concern  . Not on file  Social History Narrative   Divorced, lives alone in [place]   Works for cigarette company in [place]   Daughter, Loren Aprahamian is primary support    Review of Systems: See HPI, otherwise negative  ROS   Physical Exam: BP 90/68   Pulse 85   Temp 99.5 F (37.5 C) (Oral)   Resp 19   Ht 5\' 10"  (1.778 m)   Wt 40.8 kg   SpO2 100%   BMI 12.91 kg/m  General:   Alert,  pleasant and cooperative in NAD Head:  Normocephalic and atraumatic. Neck:  Supple; Lungs:  Clear throughout to auscultation.    Heart:  Regular rate and rhythm. Abdomen:  Soft, nontender and nondistended. Normal bowel sounds, without guarding, and without rebound.   Neurologic:  Alert and  oriented x4;  grossly normal neurologically.  Impression/Plan:     Abnormal CT scan   Plan:  EGD TODAY.  DISCUSSED PROCEDURE, BENEFITS, & RISKS: < 1% chance of medication reaction, bleeding, perforation, or ASPIRATION.

## 2019-02-13 NOTE — Op Note (Addendum)
[facility] Patient Name: Troy [place] Procedure Date: 02/13/2019 10:12 AM MRN: [id number] Date of Birth: [birth year] Attending MD: Barney Drain MD, MD CSN: [id number] Age: 63 Admit Type: Inpatient Procedure:                Upper GI endoscopy WITH COLD FORCEPS BIOPSY Indications:              Abnormal CT of the GI tract, Weight loss Providers:                Barney Drain MD, MD, Charlsie Quest. Theda Sers RN, RN,                            Raphael Gibney, Technician Referring MD:             Zella Richer. Scotty Court, MD Medicines:                Meperidine 25 mg IV, Midazolam 2 mg IV Complications:            No immediate complications. Estimated Blood Loss:     Estimated blood loss was minimal. Procedure:                Pre-Anesthesia Assessment:                           - Prior to the procedure, a History and Physical                            was performed, and patient medications and                            allergies were reviewed. The patient's tolerance of                            previous anesthesia was also reviewed. The risks                            and benefits of the procedure and the sedation                            options and risks were discussed with the patient.                            All questions were answered, and informed consent                            was obtained. Prior Anticoagulants: The patient has                            taken no previous anticoagulant or antiplatelet                            agents. ASA Grade Assessment: III - A patient with                            severe systemic disease. After reviewing the risks  and benefits, the patient was deemed in                            satisfactory condition to undergo the procedure.                           After obtaining informed consent, the endoscope was                            passed under direct vision. Throughout the                            procedure, the  patient's blood pressure, pulse, and                            oxygen saturations were monitored continuously. The                            [id number] ID:[id number]) scope was introduced through the                            mouth, and advanced to the duodenal bulb. The upper                            GI endoscopy was accomplished without difficulty.                            The patient tolerated the procedure well. Scope In: 10:40:00 AM Scope Out: 10:55:33 AM Total Procedure Duration: 0 hours 15 minutes 33 seconds  Findings:      Non-severe esophagitis with bleeding was found. BRUSH Biopsies were       taken with a cold forceps for histology.      Evidence of a patent Billroth II gastrojejunostomy was found. The       gastrojejunal anastomosis was characterized by healthy appearing mucosa.       This was traversed. The efferent limb was examined. The afferent limb       was examined. Biopsies were taken with a cold forceps for histology FROM       THE AFFERENT LIMB AT ANASTOMOSIS.      Diffuse moderate inflammation characterized by congestion (edema),       erosions and erythema was found in the cardia and in the gastric fundus.       Biopsies(5) were taken with a cold forceps for Helicobacter pylori       testing.      The examined jejunum was normal. Impression:               - Non-severe esophagitis. Biopsied.                           - Patent Billroth II gastrojejunostomy was found,                            characterized by healthy appearing mucosa. Biopsied.                           -  MODERATE Gastritis. Biopsied. Moderate Sedation:      Moderate (conscious) sedation was administered by the endoscopy nurse       and supervised by the endoscopist. The following parameters were       monitored: oxygen saturation, heart rate, blood pressure, and response       to care. Total physician intraservice time was 19 minutes. Recommendation:           - Return patient to hospital ward  for ongoing care.                           - Resume previous diet.                           - Continue present medications. PROTONIX DAILY.                           - Await pathology results. Procedure Code(s):        --- Professional ---                           [phone], Esophagogastroduodenoscopy, flexible,                            transoral; with biopsy, single or multiple                           G0500, Moderate sedation services provided by the                            same physician or other qualified health care                            professional performing a gastrointestinal                            endoscopic service that sedation supports,                            requiring the presence of an independent trained                            observer to assist in the monitoring of the                            patient's level of consciousness and physiological                            status; initial 15 minutes of intra-service time;                            patient age 79 years or older (additional time may                            be reported with [phone], as appropriate) Diagnosis Code(s):        --- Professional ---  B37.81, Candidal esophagitis                           Z98.0, Intestinal bypass and anastomosis status                           K29.70, Gastritis, unspecified, without bleeding                           R63.4, Abnormal weight loss                           R93.3, Abnormal findings on diagnostic imaging of                            other parts of digestive tract CPT copyright 2019 American Medical Association. All rights reserved. The codes documented in this report are preliminary and upon coder review may  be revised to meet current compliance requirements. Barney Drain, MD Barney Drain MD, MD 02/13/2019 11:38:28 AM This report has been signed electronically. Number of Addenda: 0

## 2019-02-13 NOTE — Progress Notes (Signed)
PT Cancellation Note  Patient Details Name: [place] MRN: [id number] DOB: 09-Apr-1956   Cancelled Treatment:    Reason Eval/Treat Not Completed: Patient at procedure or test/unavailable.  Patient out for upper endoscopy.   3:54 PM, 02/13/19 Lonell Grandchild, MPT Physical Therapist with [facility] [phone] office [phone] mobile phone

## 2019-02-13 NOTE — NC FL2 (Signed)
[place] LEVEL OF CARE SCREENING TOOL     IDENTIFICATION  Patient Name: Troy [place] Birthdate: 1955/10/21 Sex: male Admission Date (Current Location): 02/09/2019  [facility] and [place] Number:  [employer] and Address:  [facility] [address], [place]      Provider Number: [id number]  Attending Physician Name and Address:  Orson Eva, MD  Relative Name and Phone Number:  [place] - daughter [phone]    Current Level of Care: Hospital Recommended Level of Care: [place] Prior Approval Number:    Date Approved/Denied:   PASRR Number: [id number]  Discharge Plan: Domiciliary (Rest home)(Assisted Living Facility)    Current Diagnoses: Patient Active Problem List   Diagnosis Date Noted  . Ascites   . Early satiety   . Abnormal CT scan, stomach   . Palliative care by specialist   . Other ascites   . Gastric adenocarcinoma ([place])   . Failure to thrive in adult 02/11/2019  . Protein-calorie malnutrition, severe 02/10/2019  . Hypokalemia 02/09/2019  . Prolonged QT interval 02/09/2019  . Hypophosphatemia 02/09/2019  . Hypernatremia 02/09/2019  . GERD (gastroesophageal reflux disease)   . Anemia   . Leukopenia   . Hypercarbia   . Goals of care, counseling/discussion 03/23/2015  . Cancer of greater curvature of stomach ([place]) 02/11/2015  . Diabetes mellitus without complication ([place])   . Gastric cancer s/p subtotal gastrectomy 01/28/15 01/28/2015    Orientation RESPIRATION BLADDER Height & Weight     Self, Time, Situation, Place  Normal Continent Weight: 89 lb 15.2 oz (40.8 kg) Height:  5\' 10"  (177.8 cm)  BEHAVIORAL SYMPTOMS/MOOD NEUROLOGICAL BOWEL NUTRITION STATUS      Continent Diet(Regular, ensure supplements, Magic cup)  AMBULATORY STATUS COMMUNICATION OF NEEDS Skin   Limited Assist Verbally Other (Comment)(heels cracking)                       Personal Care Assistance Level of  Assistance  Bathing, Feeding, Dressing Bathing Assistance: Limited assistance Feeding assistance: Independent Dressing Assistance: Limited assistance     Functional Limitations Info  Sight, Hearing, Speech Sight Info: Adequate Hearing Info: Adequate Speech Info: Adequate    SPECIAL CARE FACTORS FREQUENCY  PT (By licensed PT)     PT Frequency: 3x/week              Contractures Contractures Info: Not present    Additional Factors Info  Code Status, Allergies Code Status Info: FULL Allergies Info: NKDA           Current Medications (02/13/2019):  This is the current hospital active medication list Current Facility-Administered Medications  Medication Dose Route Frequency Provider Last Rate Last Dose  . acetaminophen (TYLENOL) tablet 650 mg  650 mg Oral Q6H PRN Fields, Sandi L, MD       Or  . acetaminophen (TYLENOL) suppository 650 mg  650 mg Rectal Q6H PRN Fields, Sandi L, MD      . Chlorhexidine Gluconate Cloth 2 % PADS 6 each  6 each Topical Daily Danie Binder, MD   6 each at 02/12/19 [phone]  . Chlorhexidine Gluconate Cloth 2 % PADS 6 each  6 each Topical Daily Danie Binder, MD   6 each at 02/12/19 [phone]  . feeding supplement (ENSURE ENLIVE) (ENSURE ENLIVE) liquid 237 mL  237 mL Oral TID BM Fields, Sandi L, MD   237 mL at 02/13/19 1315  . feeding supplement (PRO-STAT SUGAR  FREE 64) liquid 30 mL  30 mL Oral BID Barney Drain L, MD   30 mL at 02/12/19 2035  . lidocaine (XYLOCAINE) 2 % viscous mouth solution           . meperidine (DEMEROL) 100 MG/ML injection           . midazolam (VERSED) 5 MG/5ML injection           . [START ON 02/14/2019] pantoprazole (PROTONIX) EC tablet 40 mg  40 mg Oral QAC breakfast Fields, Sandi L, MD      . Phospha 250 Neutral [phone] MG TABS 500 mg  500 mg Oral BID WC Fields, Sandi L, MD   500 mg at 02/13/19 1717  . tuberculin injection 5 Units  5 Units Intradermal Once Orson Eva, MD   5 Units at 02/12/19 1339     Discharge  Medications: Please see discharge summary for a list of discharge medications.  Relevant Imaging Results:  Relevant Lab Results:   Additional Information SS# [id number]  Ihor Gully, LCSW

## 2019-02-14 LAB — CBC
HCT: 24.2 % — ABNORMAL LOW (ref 39.0–52.0)
Hemoglobin: 7.8 g/dL — ABNORMAL LOW (ref 13.0–17.0)
MCH: 28.2 pg (ref 26.0–34.0)
MCHC: 32.2 g/dL (ref 30.0–36.0)
MCV: 87.4 fL (ref 80.0–100.0)
Platelets: 212 10*3/uL (ref 150–400)
RBC: 2.77 MIL/uL — ABNORMAL LOW (ref 4.22–5.81)
RDW: 14.8 % (ref 11.5–15.5)
WBC: 5.3 10*3/uL (ref 4.0–10.5)
nRBC: 0 % (ref 0.0–0.2)

## 2019-02-14 LAB — BASIC METABOLIC PANEL
Anion gap: 4 — ABNORMAL LOW (ref 5–15)
BUN: 23 mg/dL (ref 8–23)
CO2: 27 mmol/L (ref 22–32)
Calcium: 7.2 mg/dL — ABNORMAL LOW (ref 8.9–10.3)
Chloride: 109 mmol/L (ref 98–111)
Creatinine, Ser: 0.6 mg/dL — ABNORMAL LOW (ref 0.61–1.24)
GFR calc Af Amer: >60 mL/min (ref >60)
GFR calc non Af Amer: >60 mL/min (ref >60)
Glucose, Bld: 145 mg/dL — ABNORMAL HIGH (ref 70–99)
Potassium: 4.9 mmol/L (ref 3.5–5.1)
Sodium: 140 mmol/L (ref 135–145)

## 2019-02-14 LAB — GLUCOSE, CAPILLARY
Glucose-Capillary: 131 mg/dL — ABNORMAL HIGH (ref 70–99)
Glucose-Capillary: 163 mg/dL — ABNORMAL HIGH (ref 70–99)

## 2019-02-14 LAB — MAGNESIUM: Magnesium: 1.8 mg/dL (ref 1.7–2.4)

## 2019-02-14 LAB — PHOSPHORUS: Phosphorus: 2.2 mg/dL — ABNORMAL LOW (ref 2.5–4.6)

## 2019-02-14 NOTE — TOC Transition Note (Addendum)
Transition of Care [facility]) - CM/SW Discharge Note   Patient Details  Name: [place] MRN: [id number] Date of Birth: [birth year]  Transition of [facility]) CM/SW Contact:  Marshell Garfinkel, RN Phone Number: 02/14/2019, 1:57 PM   Clinical Narrative:     RNCM spoke with med-tech at [facility] and they are ready to accept patient. FL2 and discharge summary faxed to [facility]. Rolling walker faxed to [employer] [phone] and I sent text to [facility].  Message sent to covering RN to check status of walker as it may have already been delivered to bedside but it is not noted by TOC so I doubt it. Update: Left message for [facility] with [employer] regarding need for rolling walker and patient discharge for today.  Update: TOC received callback from [facility] with [employer] stating that walker will be delivered to bedside. RN updated. Final next level of care: Assisted Living([facility]) Barriers to Discharge: No Barriers Identified   Patient Goals and CMS Choice     Choice offered to / list presented to : Adult Children  Discharge Placement                       Discharge Plan and Services                DME Arranged: Walker rolling DME Agency: [employer] Date DME Agency Contacted: 02/14/19 Time DME Agency Contacted: [id number] Representative spoke with at [facility]; faxed order to [phone]            Social Determinants of Health (SDOH) Interventions     Readmission Risk Interventions No flowsheet data found.

## 2019-02-14 NOTE — Plan of Care (Signed)
Pt planned for discharge today.   Problem: Education: Goal: Knowledge of General Education information will improve Description: Including pain rating scale, medication(s)/side effects and non-pharmacologic comfort measures Outcome: Progressing   Problem: Health Behavior/Discharge Planning: Goal: Ability to manage health-related needs will improve Outcome: Progressing   Problem: Clinical Measurements: Goal: Ability to maintain clinical measurements within normal limits will improve Outcome: Progressing Goal: Will remain free from infection Outcome: Progressing Goal: Diagnostic test results will improve Outcome: Progressing Goal: Respiratory complications will improve Outcome: Progressing Goal: Cardiovascular complication will be avoided Outcome: Progressing   Problem: Activity: Goal: Risk for activity intolerance will decrease Outcome: Progressing   Problem: Nutrition: Goal: Adequate nutrition will be maintained Outcome: Progressing   Problem: Coping: Goal: Level of anxiety will decrease Outcome: Progressing   Problem: Elimination: Goal: Will not experience complications related to bowel motility Outcome: Progressing Goal: Will not experience complications related to urinary retention Outcome: Progressing   Problem: Pain Managment: Goal: General experience of comfort will improve Outcome: Progressing   Problem: Safety: Goal: Ability to remain free from injury will improve Outcome: Progressing   Problem: Skin Integrity: Goal: Risk for impaired skin integrity will decrease Outcome: Progressing

## 2019-02-14 NOTE — Progress Notes (Signed)
Patient report called to [address], [place]. Report given to Tarri Abernethy, Med Tech. [facility]AR of [facility] called to request patient transport. Pt and son updated on status of discharge.

## 2019-02-14 NOTE — NC FL2 (Signed)
[place] LEVEL OF CARE SCREENING TOOL     IDENTIFICATION  Patient Name: Troy Barrera Birthdate: 10-13-1955 Sex: male Admission Date (Current Location): 02/09/2019  [facility] and [place] Number:  [employer] and Address:  [place] [address], [place]      Provider Number: [id number]  Attending Physician Name and Address:  Orson Eva, MD  Relative Name and Phone Number:  Welcome - daughter [phone]    Current Level of Care: Hospital Recommended Level of Care: [place] Prior Approval Number:    Date Approved/Denied:   PASRR Number: [id number]  Discharge Plan: Domiciliary (Rest home)(Assisted Living Facility)    Current Diagnoses: Patient Active Problem List   Diagnosis Date Noted  . Ascites   . Early satiety   . Abnormal CT scan, stomach   . Palliative care by specialist   . Other ascites   . Gastric adenocarcinoma ([place])   . Failure to thrive in adult 02/11/2019  . Protein-calorie malnutrition, severe 02/10/2019  . Hypokalemia 02/09/2019  . Prolonged QT interval 02/09/2019  . Hypophosphatemia 02/09/2019  . Hypernatremia 02/09/2019  . GERD (gastroesophageal reflux disease)   . Anemia   . Leukopenia   . Hypercarbia   . Goals of care, counseling/discussion 03/23/2015  . Cancer of greater curvature of stomach ([place]) 02/11/2015  . Diabetes mellitus without complication ([place])   . Gastric cancer s/p subtotal gastrectomy 01/28/15 01/28/2015    Orientation RESPIRATION BLADDER Height & Weight     Self, Time, Situation, Place  Normal Continent Weight: 40.8 kg Height:  5\' 10"  (177.8 cm)  BEHAVIORAL SYMPTOMS/MOOD NEUROLOGICAL BOWEL NUTRITION STATUS      Continent Diet(Regular, ensure supplements, Magic cup)  AMBULATORY STATUS COMMUNICATION OF NEEDS Skin   Limited Assist Verbally Other (Comment)(heels cracking)                       Personal Care Assistance Level of Assistance  Bathing,  Feeding, Dressing Bathing Assistance: Limited assistance Feeding assistance: Independent Dressing Assistance: Limited assistance     Functional Limitations Info  Sight, Hearing, Speech Sight Info: Adequate Hearing Info: Adequate Speech Info: Adequate    SPECIAL CARE FACTORS FREQUENCY  PT (By licensed PT)     PT Frequency: 3x/week              Contractures Contractures Info: Not present    Additional Factors Info  Code Status, Allergies Code Status Info: FULL Allergies Info: NKDA           Current Medications (02/14/2019):   Discharge Instructions  Allergies as of 02/14/2019   No Known Allergies              Medication List        STOP taking these medications       potassium chloride SA 20 MEQ tablet Commonly known as: KLOR-CON             TAKE these medications       Ensure Plus Liqd Take 237 mLs by mouth 2 (two) times daily between meals. What changed: Another medication with the same name was added. Make sure you understand how and when to take each.   feeding supplement (ENSURE ENLIVE) Liqd Take 237 mLs by mouth 3 (three) times daily between meals. What changed: You were already taking a medication with the same name, and this prescription was added. Make sure you understand how and when to take each.  feeding supplement (PRO-STAT SUGAR FREE 64) Liqd Take 30 mLs by mouth 2 (two) times daily.   metoCLOPramide 10 MG tablet Commonly known as: REGLAN TAKE (1) TABLET BY MOUTH (3) TIMES DAILY BEFORE MEALS. What changed:   how much to take  how to take this  when to take this  additional instructions   multivitamin tablet Take 1 tablet by mouth daily. megamend   pantoprazole 40 MG tablet Commonly known as: PROTONIX TAKE 1 TABLET BY MOUTH DAILY.                 Discharge Medications: Please see discharge summary for a list of discharge medications.  Relevant Imaging Results:  Relevant Lab  Results:   Additional Information SS# [id number]  Marshell Garfinkel, RN

## 2019-02-16 ENCOUNTER — Other Ambulatory Visit: Payer: Self-pay

## 2019-02-16 ENCOUNTER — Telehealth: Payer: Self-pay | Admitting: *Deleted

## 2019-02-16 LAB — SURGICAL PATHOLOGY

## 2019-02-16 NOTE — Telephone Encounter (Addendum)
Family is requesting referral to Hospice. Please call verbal order or fax order to [phone]. Per Dr. Benay Spice: OK for Hospice referral. Notified Helene Kelp, RN at facility.

## 2019-02-17 ENCOUNTER — Telehealth: Payer: Self-pay | Admitting: Gastroenterology

## 2019-02-17 NOTE — Telephone Encounter (Addendum)
PLEASE CALL PT'S DAUGHTER. His BIOPSIES shows gastritis. CONTINUE PROTONIX. TAKE 30 MINUTES PRIOR TO MEALS TWICE DAILY FOR 2 WEEKS THEN REDUCE TO ONCE DAILY.  Please CALL DR. Michail Sermon WITH QUESTIONS OR CONCERNS.

## 2019-02-18 NOTE — Progress Notes (Signed)
cc'd to pcp 

## 2019-02-19 MED ORDER — PANTOPRAZOLE SODIUM 40 MG PO TBEC: DELAYED_RELEASE_TABLET | ORAL | 5 refills | Status: AC

## 2019-02-19 NOTE — Telephone Encounter (Addendum)
Pt's daughter, Osborne, is aware of the results and plan.  Dr. Oneida Alar, please print a prescription for the Protonix and I will fax to facility. Facility:  [facility]                Phone: [phone]                Fax:      [phone]  Danielle asked me to also have this info faxed to: Dr. Jiles Prows at [facility]  And pt was just admitted to hospice [facility]) [facility] would like this info faxed to them @ [phone]. Their phone number is [phone].   Forwarding to Dr. Oneida Alar for the prescription. Forwarding to Manuela Schwartz to fax copy of this to Dr. Michail Sermon and [facility].

## 2019-02-19 NOTE — Telephone Encounter (Signed)
PLEASE CALL PT'S DAUGHTER. RX SENT.

## 2019-02-19 NOTE — Addendum Note (Signed)
Addended by: Danie Binder on: 02/19/2019 03:56 PM   Modules accepted: Orders

## 2019-02-20 NOTE — Telephone Encounter (Signed)
cc'ed to [facility] and [facility] is in epic

## 2019-02-20 NOTE — Telephone Encounter (Signed)
Rx for Protonix has been faxed to the facility.

## 2019-04-07 DEATH — deceased

## 2019-05-06 ENCOUNTER — Telehealth: Payer: Self-pay | Admitting: *Deleted

## 2019-05-06 NOTE — Telephone Encounter (Signed)
Records faxed to [facility] and copy for Wilbert Merrit (daughter) - Release [id number]

## 2019-10-22 IMAGING — CT CT ABD-PELV W/ CM
3 of 5 series · 13 of 36 positions shown, 15 images · IV contrast (omnipaque)
Comparison: Chest x-ray 02/09/2019, CT 04/08/2017, 11/30/2016

CLINICAL DATA: Unintended weight loss abdominal pain history of
gastric cancer

EXAM:
CT CHEST, ABDOMEN, AND PELVIS WITH CONTRAST
TECHNIQUE: Multidetector CT imaging of the chest, abdomen and pelvis was
performed following the standard protocol during bolus
administration of intravenous contrast.
CONTRAST:  75mL OMNIPAQUE IOHEXOL 300 MG/ML SOLN, 30mL OMNIPAQUE
IOHEXOL 300 MG/ML SOLN

[Series 3: cap with · axial · 0.61mm/px · z∈[-466,+19]mm · 8 of 125 slices shown, 10 images]
[im 14/125  mediastinal]
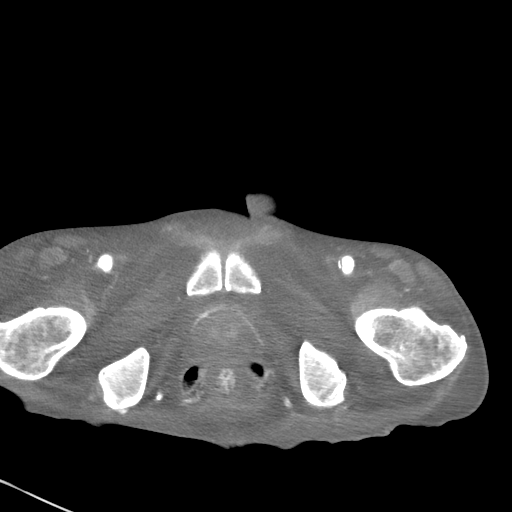
[im 14/125  lung]
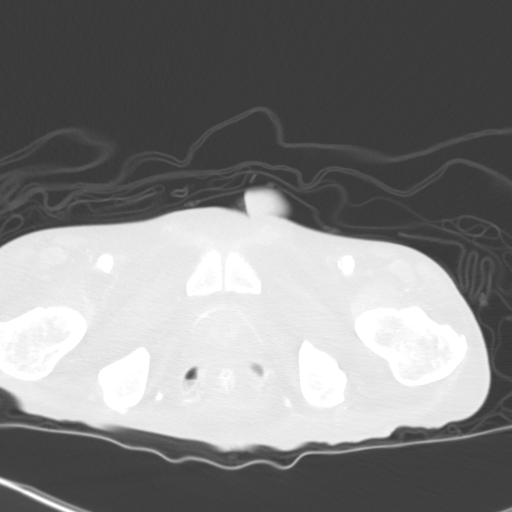
[im 28/125  lung]
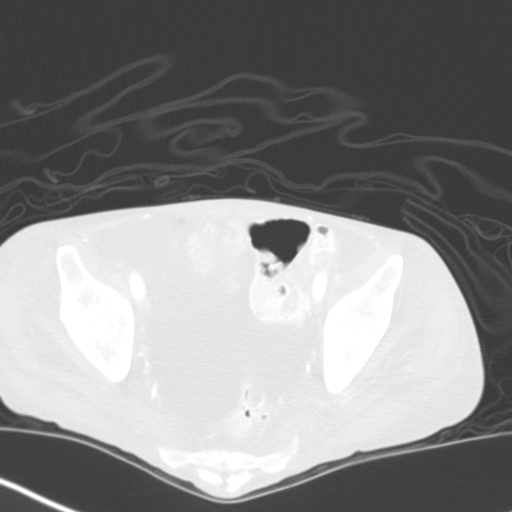
[im 42/125  lung]
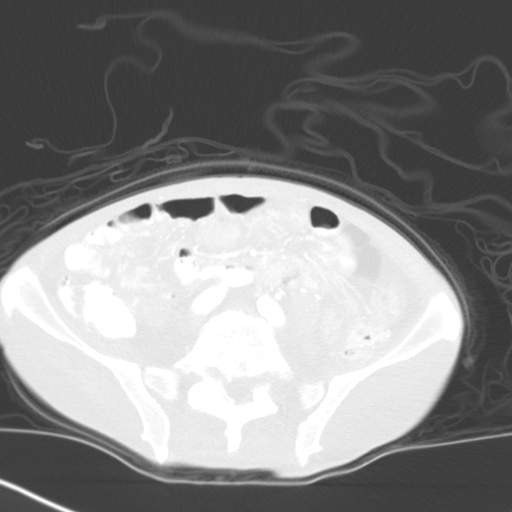
[im 56/125  lung]
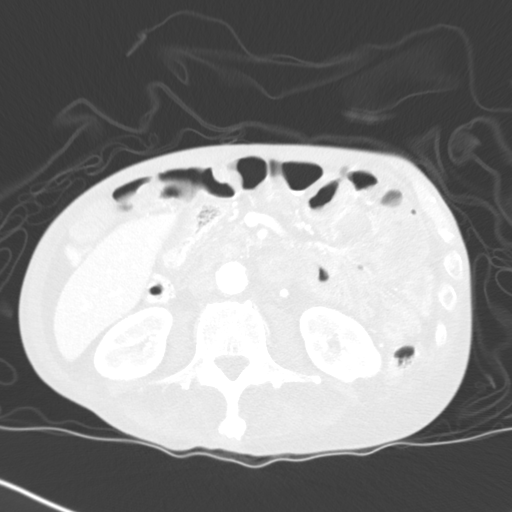
[im 69/125  mediastinal]
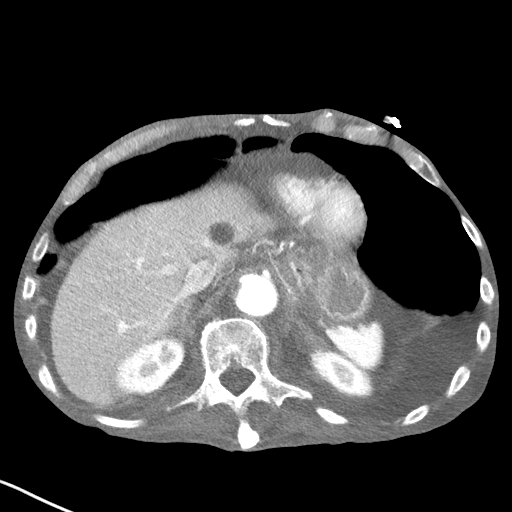
[im 69/125  lung]
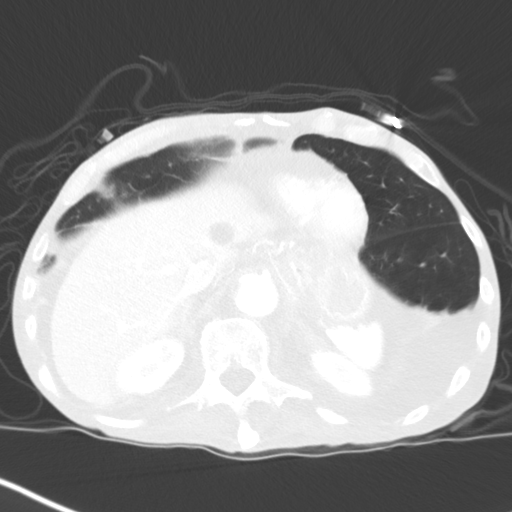
[im 83/125  lung]
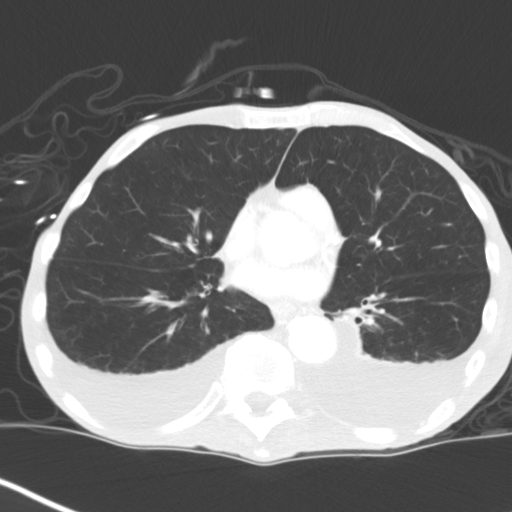
[im 97/125  lung]
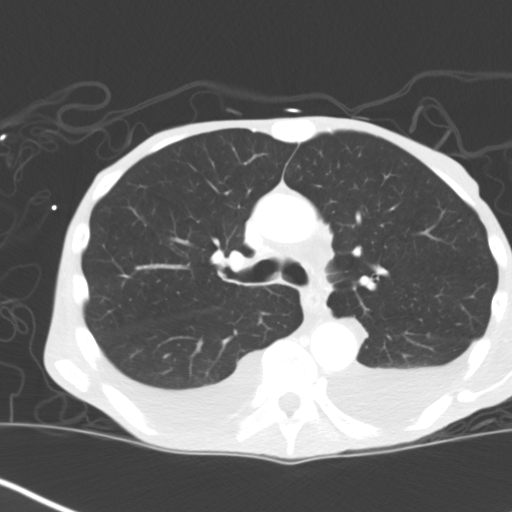
[im 111/125  lung]
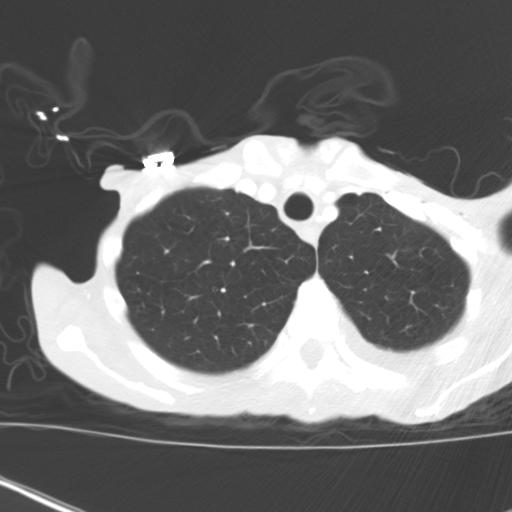

[Series 4: lung · axial · 0.72mm/px · z∈[-233,-183]mm · 2 of 174 slices shown]
[im 13/174  lung]
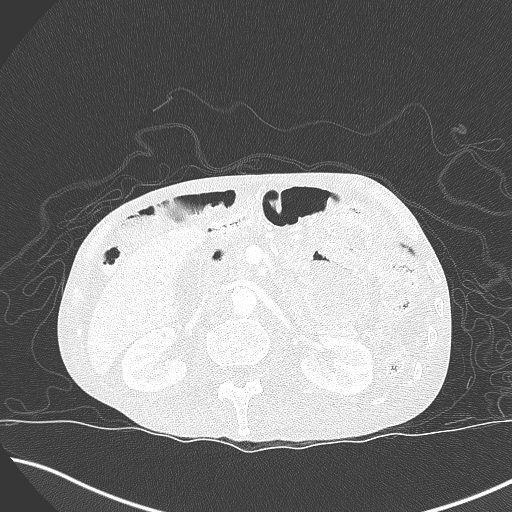
[im 38/174  lung]
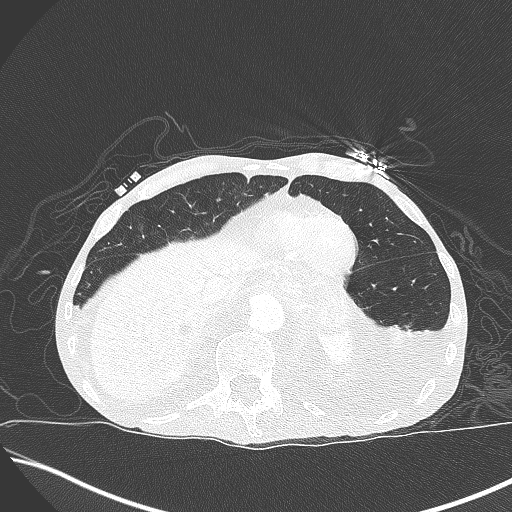

[Series 7: coronals · coronal · 0.65mm/px · 3 of 119 slices shown]
[im 24/119  lung]
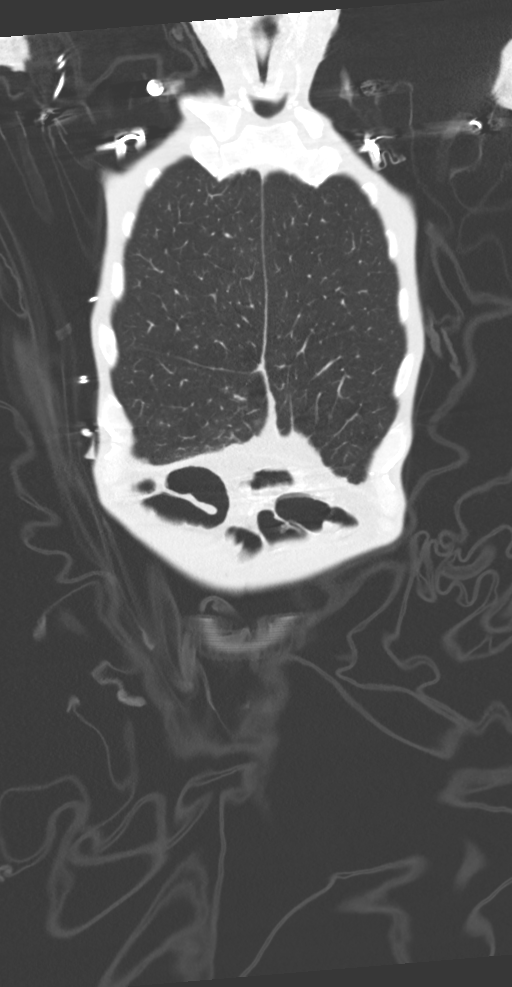
[im 48/119  lung]
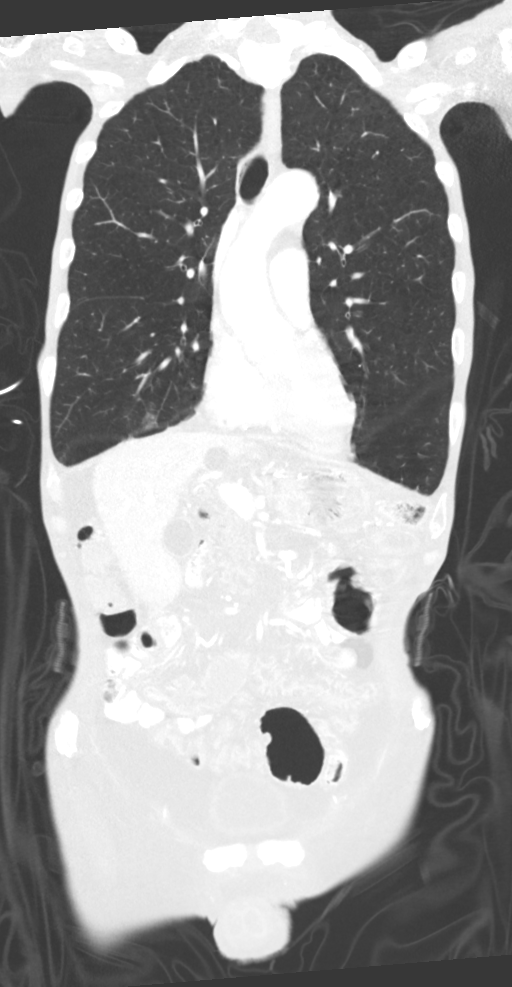
[im 71/119  lung]
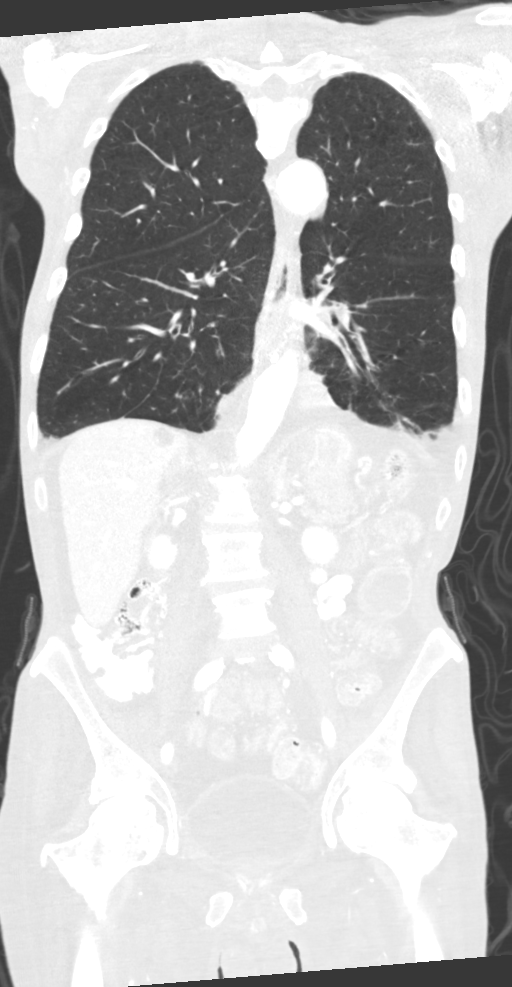

[13 of 36 positions shown; findings below may reference images not displayed]

FINDINGS: CT CHEST FINDINGS

Cardiovascular: Nonaneurysmal aorta. Mild aortic atherosclerosis. No
dissection is seen. Coronary calcifications. Normal heart size.
Small pericardial effusion at the cardiac base.

Mediastinum/Nodes: No thyroid mass. Midline trachea. No significant
mediastinal adenopathy. Esophagus within normal limits.

Lungs/Pleura: Moderate bilateral pleural effusions. Mild
emphysematous disease. Passive atelectasis at the lower lobes.

Musculoskeletal: Sternum is intact. Vertebral bodies demonstrate
normal stature. No acute or suspicious osseous abnormality.

CT ABDOMEN PELVIS FINDINGS

Hepatobiliary: Circumscribed low-density lesions in the liver
consistent with cysts. No change. No calcified gallstone or biliary
dilatation

Pancreas: Atrophic. No ductal dilatation.

Spleen: Normal in size without focal abnormality.

Adrenals/Urinary Tract: Stable left adrenal gland nodularity.
Negative right adrenal gland. Kidneys show no hydronephrosis.
Subcentimeter hypodensities in the mid right kidney too small to
further characterize. Cyst in the lower pole of the right kidney.
Urinary bladder unremarkable.

Stomach/Bowel: Status post partial gastrectomy with
gastrojejunostomy. Thickened appearance at the gastric small bowel
anastomosis and thickened appearance of post anastomotic jejunum.
Dilated segment of fluid-filled small bowel up to 3.4 cm in the left
lower quadrant but no convincing obstruction distal to this.
Negative for colon wall thickening. Negative appendix.

Vascular/Lymphatic: Moderate aortic atherosclerosis. Increased mural
thrombus right side of the distal descending thoracic aorta and the
proximal abdominal aorta. No aneurysm. SMA grossly patent. No
grossly enlarged lymph nodes

Reproductive: Prostate is unremarkable.

Other: No free air. Large volume of ascites throughout the abdomen
and pelvis. Diffuse edema within the subcutaneous soft tissues of
the body wall.

Musculoskeletal: No acute or suspicious osseous abnormality.
IMPRESSION: 1. Difficult study secondary to ataxia and generalized edema and
ascites. Status post partial gastrectomy with gastrojejunostomy.
Thickened appearance at the gastrojejunal anastomosis and thickened
appearance of the post anastomotic small bowel, somewhat more
prominent compared to prior. Findings could be secondary to
infection, inflammation, or recurrent disease. Consider correlation
with direct visualization.
2. Dilated loop of small bowel in the left lower quadrant though no
convincing evidence for a bowel obstruction
3. Large volume of ascites throughout the abdomen and pelvis with
diffuse anasarca. Moderate bilateral pleural effusions.
